# Patient Record
Sex: Male | Born: 1935 | Race: White | Hispanic: No | Marital: Married | Smoking: Former smoker
Health system: Southern US, Community
[De-identification: ages and names within clinical notes are randomized; demographics above are authoritative.]

## PROBLEM LIST (undated history)

## (undated) DIAGNOSIS — K4091 Unilateral inguinal hernia, without obstruction or gangrene, recurrent: Secondary | ICD-10-CM

## (undated) DIAGNOSIS — I10 Essential (primary) hypertension: Secondary | ICD-10-CM

## (undated) DIAGNOSIS — K4090 Unilateral inguinal hernia, without obstruction or gangrene, not specified as recurrent: Secondary | ICD-10-CM

## (undated) DIAGNOSIS — I499 Cardiac arrhythmia, unspecified: Secondary | ICD-10-CM

## (undated) DIAGNOSIS — M1990 Unspecified osteoarthritis, unspecified site: Secondary | ICD-10-CM

## (undated) DIAGNOSIS — E119 Type 2 diabetes mellitus without complications: Secondary | ICD-10-CM

## (undated) DIAGNOSIS — R972 Elevated prostate specific antigen [PSA]: Secondary | ICD-10-CM

## (undated) DIAGNOSIS — I639 Cerebral infarction, unspecified: Secondary | ICD-10-CM

## (undated) DIAGNOSIS — N189 Chronic kidney disease, unspecified: Secondary | ICD-10-CM

## (undated) DIAGNOSIS — Z87442 Personal history of urinary calculi: Secondary | ICD-10-CM

## (undated) HISTORY — DX: Unspecified osteoarthritis, unspecified site: M19.90

## (undated) HISTORY — PX: JOINT REPLACEMENT: SHX530

## (undated) HISTORY — DX: Elevated prostate specific antigen (PSA): R97.20

## (undated) HISTORY — DX: Essential (primary) hypertension: I10

## (undated) HISTORY — PX: TONSILLECTOMY: SUR1361

## (undated) HISTORY — DX: Cerebral infarction, unspecified: I63.9

## (undated) HISTORY — PX: KNEE SURGERY: SHX244

## (undated) HISTORY — PX: EYE SURGERY: SHX253

---

## 1958-10-18 DIAGNOSIS — J189 Pneumonia, unspecified organism: Secondary | ICD-10-CM

## 1958-10-18 HISTORY — DX: Pneumonia, unspecified organism: J18.9

## 2010-08-05 ENCOUNTER — Encounter (INDEPENDENT_AMBULATORY_CARE_PROVIDER_SITE_OTHER): Payer: Self-pay | Admitting: Orthopedic Surgery

## 2010-08-05 ENCOUNTER — Ambulatory Visit: Payer: Self-pay | Admitting: Vascular Surgery

## 2010-08-05 ENCOUNTER — Ambulatory Visit (HOSPITAL_COMMUNITY): Admission: RE | Admit: 2010-08-05 | Discharge: 2010-08-05 | Payer: Self-pay | Admitting: Orthopedic Surgery

## 2010-11-16 LAB — COMPREHENSIVE METABOLIC PANEL
ALT: 32 U/L (ref 0–53)
AST: 26 U/L (ref 0–37)
Albumin: 4 g/dL (ref 3.5–5.2)
Alkaline Phosphatase: 81 U/L (ref 39–117)
Chloride: 102 mEq/L (ref 96–112)
GFR calc Af Amer: >60 mL/min (ref >60)
Potassium: 4.3 mEq/L (ref 3.5–5.1)
Total Bilirubin: 0.3 mg/dL (ref 0.3–1.2)

## 2010-11-16 LAB — CBC
HCT: 44.2 % (ref 39.0–52.0)
MCHC: 33.3 g/dL (ref 30.0–36.0)
MCV: 90 fL (ref 78.0–100.0)
Platelets: 217 10*3/uL (ref 150–400)
RDW: 12.7 % (ref 11.5–15.5)

## 2010-11-16 LAB — ABO/RH: ABO/RH(D): O POS

## 2010-11-16 LAB — DIFFERENTIAL
Basophils Absolute: 0 10*3/uL (ref 0.0–0.1)
Eosinophils Absolute: 0.1 10*3/uL (ref 0.0–0.7)
Eosinophils Relative: 2 % (ref 0–5)
Lymphocytes Relative: 19 % (ref 12–46)
Monocytes Absolute: 0.8 10*3/uL (ref 0.1–1.0)

## 2010-11-16 LAB — PROTIME-INR: INR: 0.9 (ref 0.00–1.49)

## 2010-11-16 LAB — URINALYSIS, ROUTINE W REFLEX MICROSCOPIC
Bilirubin Urine: NEGATIVE
Ketones, ur: NEGATIVE mg/dL
Urine Glucose, Fasting: NEGATIVE mg/dL
pH: 6 (ref 5.0–8.0)

## 2010-11-16 LAB — URINE MICROSCOPIC-ADD ON

## 2010-11-20 ENCOUNTER — Inpatient Hospital Stay (HOSPITAL_COMMUNITY)
Admission: RE | Admit: 2010-11-20 | Discharge: 2010-11-24 | DRG: 470 | Disposition: A | Discharge status: Home Health | Payer: MEDICARE | Attending: Orthopedic Surgery | Admitting: Orthopedic Surgery

## 2010-11-20 ENCOUNTER — Inpatient Hospital Stay (HOSPITAL_COMMUNITY): Payer: MEDICARE

## 2010-11-20 DIAGNOSIS — R062 Wheezing: Secondary | ICD-10-CM | POA: Diagnosis not present

## 2010-11-20 DIAGNOSIS — N400 Enlarged prostate without lower urinary tract symptoms: Secondary | ICD-10-CM | POA: Diagnosis present

## 2010-11-20 DIAGNOSIS — Z96659 Presence of unspecified artificial knee joint: Secondary | ICD-10-CM

## 2010-11-20 DIAGNOSIS — R339 Retention of urine, unspecified: Secondary | ICD-10-CM | POA: Diagnosis not present

## 2010-11-20 DIAGNOSIS — R0902 Hypoxemia: Secondary | ICD-10-CM | POA: Diagnosis not present

## 2010-11-20 DIAGNOSIS — M1710 Unilateral primary osteoarthritis, unspecified knee: Principal | ICD-10-CM | POA: Diagnosis present

## 2010-11-20 DIAGNOSIS — I10 Essential (primary) hypertension: Secondary | ICD-10-CM | POA: Diagnosis present

## 2010-11-20 DIAGNOSIS — D62 Acute posthemorrhagic anemia: Secondary | ICD-10-CM | POA: Diagnosis not present

## 2010-11-20 DIAGNOSIS — E876 Hypokalemia: Secondary | ICD-10-CM | POA: Diagnosis not present

## 2010-11-20 DIAGNOSIS — Z7982 Long term (current) use of aspirin: Secondary | ICD-10-CM

## 2010-11-20 HISTORY — PX: REPLACEMENT TOTAL KNEE: SUR1224

## 2010-11-21 LAB — CBC
HCT: 32.2 % — ABNORMAL LOW (ref 39.0–52.0)
Hemoglobin: 10.6 g/dL — ABNORMAL LOW (ref 13.0–17.0)
RDW: 12.7 % (ref 11.5–15.5)
WBC: 9.8 10*3/uL (ref 4.0–10.5)

## 2010-11-21 LAB — BASIC METABOLIC PANEL
CO2: 30 mEq/L (ref 19–32)
GFR calc non Af Amer: >60 mL/min (ref >60)
Glucose, Bld: 134 mg/dL — ABNORMAL HIGH (ref 70–99)
Potassium: 3.7 mEq/L (ref 3.5–5.1)
Sodium: 138 mEq/L (ref 135–145)

## 2010-11-22 LAB — CBC
HCT: 30.6 % — ABNORMAL LOW (ref 39.0–52.0)
Hemoglobin: 9.9 g/dL — ABNORMAL LOW (ref 13.0–17.0)
MCHC: 32.4 g/dL (ref 30.0–36.0)

## 2010-11-22 LAB — BASIC METABOLIC PANEL
CO2: 27 mEq/L (ref 19–32)
Calcium: 8.5 mg/dL (ref 8.4–10.5)
Chloride: 100 mEq/L (ref 96–112)
Glucose, Bld: 138 mg/dL — ABNORMAL HIGH (ref 70–99)
Potassium: 3.7 mEq/L (ref 3.5–5.1)
Sodium: 139 mEq/L (ref 135–145)

## 2010-11-23 ENCOUNTER — Inpatient Hospital Stay (HOSPITAL_COMMUNITY): Payer: MEDICARE

## 2010-11-23 ENCOUNTER — Encounter (HOSPITAL_COMMUNITY): Payer: Self-pay | Admitting: Orthopedic Surgery

## 2010-11-23 LAB — BASIC METABOLIC PANEL
BUN: 8 mg/dL (ref 6–23)
CO2: 28 mEq/L (ref 19–32)
Calcium: 8.5 mg/dL (ref 8.4–10.5)
Creatinine, Ser: 1 mg/dL (ref 0.4–1.5)
Glucose, Bld: 120 mg/dL — ABNORMAL HIGH (ref 70–99)

## 2010-11-23 LAB — CBC
HCT: 28.8 % — ABNORMAL LOW (ref 39.0–52.0)
Hemoglobin: 9.4 g/dL — ABNORMAL LOW (ref 13.0–17.0)
MCH: 29.6 pg (ref 26.0–34.0)
MCHC: 32.6 g/dL (ref 30.0–36.0)
MCV: 90.6 fL (ref 78.0–100.0)

## 2010-11-23 MED ORDER — IOHEXOL 300 MG/ML  SOLN
100.0000 mL | Freq: Once | INTRAMUSCULAR | Status: AC | PRN
  Administered 2010-11-23: 100 mL via INTRAVENOUS

## 2010-11-24 LAB — BASIC METABOLIC PANEL
CO2: 29 mEq/L (ref 19–32)
Calcium: 8.6 mg/dL (ref 8.4–10.5)
Chloride: 103 mEq/L (ref 96–112)
Creatinine, Ser: 1.03 mg/dL (ref 0.4–1.5)
Glucose, Bld: 115 mg/dL — ABNORMAL HIGH (ref 70–99)

## 2010-11-24 LAB — CBC
HCT: 25.8 % — ABNORMAL LOW (ref 39.0–52.0)
Hemoglobin: 8.6 g/dL — ABNORMAL LOW (ref 13.0–17.0)
MCH: 30.3 pg (ref 26.0–34.0)
MCHC: 33.3 g/dL (ref 30.0–36.0)
RBC: 2.84 MIL/uL — ABNORMAL LOW (ref 4.22–5.81)

## 2010-11-24 NOTE — Consult Note (Signed)
NAME:  Travis [name], Travis [name] NO.:  [id number]  MEDICAL RECORD NO.:  [id number]           PATIENT TYPE:  I  LOCATION:  5018                         FACILITY:  [facility]  PHYSICIAN:  Vania Rea, M.D. DATE OF BIRTH:  06-28-1930  DATE OF CONSULTATION:  11/23/2010 DATE OF DISCHARGE:                                CONSULTATION   PRIMARY CARE PHYSICIAN:  Windle Guard, M.D.  REQUESTING PHYSICIAN:  Dyke Brackett, M.D., orthopedic surgeon.  REASON FOR CONSULTATION:  Wheezing and hypoxia.  HISTORY OF PRESENT ILLNESS:  This is a 77 year old Caucasian gentleman with a history of hypertension and severe osteoarthritis who was electively admitted on November 20, 2010 for total left knee replacement. The patient had an uneventful left knee replacement and has been recuperating well in the hospital.  His hospital stay so far been complicated by urinary retention.  The patient does have a history of benign prostatic hypertrophy and has been prescribed doxazosin, but says he took it for short time and did not seem to be helping so he discontinued taking it, although it is listed on his medication list. Postoperatively, the patient has had episodes of urinary retention and has had a Foley catheter placed.  Additionally, the patient has had episodes of sundowning at night and last night pulled out his Foley catheter and had to have it replaced.  Additionally, the patient's antihypertensive medications were held postoperatively and his blood pressure did rise into the hypertensive range and his medications were restarted this morning.  This morning, the patient was noted to be wheezing and seem to be somewhat short of breath.  His oxygenation was in the low 90s.  His orthopedic surgeons were concerned.  They have ordered chest x-ray, CT angiogram of the chest, and consulted the hospitalist service for assistance with management.  The patient reports no history of heart failure,  but reports that he does have a history of episodic wheezing, but he never takes anything for it.  He reports that he discontinued smoking 50 years ago in his 80s.  He has no family history of lung disease nor heart.  He does have a history of heart disease in second-degree relatives, but he has never been told to have heart disease.  He has noted no change in his urination while in hospital.  He usually has to wake up frequently at night to pass urine.  Review of e-chart records show that he has received normal saline at 75 mL an hour continuously since the postoperative period.  Review of hospital records show his hemoglobin to be 14.7 on November 16, 2010 prior to surgery and 10.6 on February 4 his first postoperative day.  Currently, the patient denies any chest pain, shortness of breath.  He denies any fever or cough.  He says he feels completely normal.  His wife who is at his bedside confirms that he is currently feeling quite okay.  She does not think there was ever anything seriously wrong with him.  She does say that the nurses report that they heard some wheezing when they examined him this morning.  PAST MEDICAL  HISTORY: 1. Hypertension. 2. Benign prostatic hypertrophy.  PREADMISSION MEDICATIONS: 1. Aspirin 325 mg daily. 2. Oxycodone/acetaminophen 5/325 1 tablet every 4 hours as needed. 3. Ibuprofen 400 mg every 8 hours as needed. 4. Doxazosin 4 mg daily.  The patient reports that he has not been     taking this. 5. Hydrochlorothiazide 25 mg daily. 6. Atenolol 50 mg daily.  CURRENT MEDICATIONS: 1. Atenolol 50 mg daily, restarted today. 2. Cardura 4 mg daily, restarted today. 3. Hydrochlorothiazide 25 mg daily, restarted today. 4. Lovenox 30 mg every 12 hours for DVT prophylaxis. 5. Tylenol p.r.n. 6. Benadryl 25 mg at bedtime p.r.n. 7. Robaxin 500 mg p.o. or IV every 6 hours p.r.n. 8. Percocet 1-2 tablets every 4 hours p.r.n. for pain.  ALLERGIES:  MOBIC which  causes dizziness.  SOCIAL HISTORY:  Denies tobacco, alcohol, or illicit drug use, as noted above.  He is retired Hotel manager and attends the [place] once per year to get a checkup.  He is a retired Freight forwarder.  FAMILY HISTORY:  Significant for a stroke in a sister, heart failure in aunts and uncles, otherwise unremarkable.  REVIEW OF SYSTEMS:  Other than noted above, unremarkable.  PHYSICAL EXAMINATION:  GENERAL:  Pleasant elderly Caucasian gentleman reclining in bed, not in any acute distress.  His left leg is in CPM machine. VITAL SIGNS:  His temperature is 96.9, pulse 83, respiration 16, blood pressure 165/81.  He is saturating at 93% on room air. HEENT:  His pupils are round and equal.  Mucous membranes are pink, anicteric.  He is not dehydrated.  No cervical lymphadenopathy.  No thyromegaly.  No carotid bruit. CHEST:  Clear to auscultation bilaterally. CARDIOVASCULAR:  Regular rhythm.  No murmur. ABDOMEN:  Obese, soft, nontender.  No masses. EXTREMITIES:  He has extensive bandage over the left knee, has a trace edema on the left foot.  No edema on the right foot.  Dorsalis pedis pulse 2+ bilaterally. CENTRAL NERVOUS SYSTEM:  Cranial nerves II through XII are grossly intact.  He has no focal lateralizing signs.  LABORATORY DATA:  His white count is 13.6, hemoglobin 9.4, platelets 161, MCV is 90.6.  His sodium is 140, potassium 3.4, chloride 104, CO2 of 28, glucose 120, BUN 8, creatinine 1.0, calcium 8.5.  Portable chest x-ray done this afternoon shows bibasilar atelectatic changes.  CT angiogram of the chest done this afternoon shows no pulmonary emboli, mild bibasilar atelectasis.  No other acute or significant finding.  ASSESSMENT: 1. Transient wheezing, now resolved? possible CHF, transient fluid overload. 2. No evidence of hypoxia. 3. Hypertension, uncontrolled. 4. Osteoarthritis status post recent left knee replacement. 5. No evidence of pulmonary embolism. 6.  Benign prostatic hypertrophy 7. postoperative urinary retention due to BPH. 8. Hypokalemia 9. Post-op anemia.  PLAN: 1. It is likely that both this gentleman's wheezing was caused by     uncontrolled hypertension and fluid overload giving rise to some     degree of heart strain on fluid overload and this may be a contributing     mechanism for his urinary retention if he has developed some     periurethral edema. 2. We would recommend discontinuing IV fluids at this time.  We will B-     type natriuretic peptide, agree with restarting his beta-blockers     and his hydrochlorothiazide.  We will replete his potassium.     Recommend monitoring potassium.  Consideration could be giving for     getting a 2-D echo to  check for diastolic/systolic dysfunction particularly     if his BNP is found to be elevated 3. This gentleman does not need oxygen supplementation. 4. Iron and potassium supplemntation 5. Other plans as per orders.     Vania Rea, M.D.     LC/MEDQ  D:  11/23/2010  T:  11/23/2010  Job:  [id number]  cc:   Windle Guard, M.D. Dyke Brackett, M.D.  Electronically Signed by Vania Rea M.D. on 11/24/2010 03:20:44 AM

## 2010-12-01 NOTE — Op Note (Signed)
NAME:  Travis [name], Travis [name] NO.:  [id number]  MEDICAL RECORD NO.:  [id number]           PATIENT TYPE:  I  LOCATION:  5018                         FACILITY:  [facility]  PHYSICIAN:  Dyke Brackett, M.D.    DATE OF BIRTH:  [birth year]  DATE OF PROCEDURE: DATE OF DISCHARGE:                              OPERATIVE REPORT   INDICATIONS:  This is a 77 year old with intractable knee pain and endstage arthritis particularly of the medial compartment of the left knee, thought to be amenable to hospitalization for total knee replacement.  PREOPERATIVE DIAGNOSIS:  Osteoarthritis, left knee.  POSTOPERATIVE DIAGNOSIS:  Osteoarthritis, left knee.  OPERATION:  Left total knee replacement (Sigma cemented size 4 femur, tibia, #41 patella with 10-mm bearing).  SURGEON:  Dyke Brackett, MD.  ASSISTANTSu Hilt, PA.  TOURNIQUET TIME:  82 minutes.  PROCEDURE:  A straight skin incision and a medial parapatellar approach to the knee make.  Significant contracture was encountered medially as well as to medial compartment arthrosis with beginnings of some severe lateral compartment arthrosis.  We cut the tibia in valgus inclination 3 and 4 mm below the most __________ medical compartment.  We then cut the distal femur with a 4-degree valgus inclination.  Significant amount of tightness was noted in the medial compartment.  We had to do an extensive medial release with stripping of the medial collaterals superficially and deep and even did a release of a portion of the semimembranosus attachment on the medial posterior aspect of the knee. Removed small osteophytes of the tibia as well.  We then __________ 10- mm bearing without excessive tightness and relatively symmetric to the lateral side.  We sized the femur to be a 4, followed by anterior-posterior cut with Chamfer cut as well.  We did some extra releasing off the posterior aspect of the posterior medial femoral condyle with full  release of anterior and posterior cruciate ligaments.  After cutting, attention was then directed to the tibia.  Sized the tibia to be a size 4 TruCut. Placed the trial tibia base plate and then cut the box for the Sigma knee and then placed the trials.  Prosthesis initially seemed to sit in slight flexion.  We corrected that and we had full extension, no significant instability to varus and valgus stress and maybe a slight amount of asymmetry with full extension with a little more opening on the lateral side than medially but this was not felt to be significant, laterally it seemed to be acceptable, and the __________ was 1-2+ with no tendency for bearing spin out with the knee flexed.  We cut  leaving 15 mm of native patella, __________ the patella to 41 size, placed the knee through trials, range of motion with all sizing and cuts to be acceptable.  We removed the trial components.  Irrigated the bony surfaces, prepared the cement on the back table, dried the prosthesis, coated the prosthesis. Placed cement down the key hole as well.  __________ into the bony surfaces.  We then put in the final prosthesis, the tibia, followed by femur and patella.  We  did use a trial bearing while the cement hardened.  Once the cement was hardened, excess cement was removed.  Once the trial bearing was removed, we then released the tourniquet without any __________ no excessive bleeding.  Noticed small bleeders were coagulated.  The Hemovac drain was placed exiting superolaterally and closure was affected with #1 Ethibond, 3-0 Vicryl, and skin clips.  He was taken to the recovery room in stable condition.     Dyke Brackett, M.D.     WDC/MEDQ  D:  11/20/2010  T:  11/21/2010  Job:  [id number]  Electronically Signed by W. Geoffry Bannister M.D. on 12/01/2010 09:36:48 AM

## 2010-12-21 NOTE — Discharge Summary (Signed)
NAME:  Travis [name], RODGER NO.:  [id number]  MEDICAL RECORD NO.:  [id number]           PATIENT TYPE:  I  LOCATION:  5018                         FACILITY:  [facility]  PHYSICIAN:  Dyke Brackett, M.D.    DATE OF BIRTH:  05-22-1930  DATE OF ADMISSION:  11/20/2010 DATE OF DISCHARGE:  11/24/2010                              DISCHARGE SUMMARY   DIAGNOSIS FOR THIS ADMISSION:  Left knee osteoarthritis.  PROCEDURE WHILE IN THE HOSPITAL:  Left total knee arthroplasty.  SECONDARY DIAGNOSES: 1. Hypertension. 2. History of migraine headache and elevated glucose levels.  DISCHARGE SUMMARY:  The patient is 77 year old male with many-month history of increasing left knee pain.  He underwent a left knee scope in 2011 with positive grade 3-4, medial grade 2-3 chondral changes of the patellofemoral compartment and positive partial meniscectomy.  The patient has continued with moderate and worsening postoperative pain despite pain medications, nonsteroidal anti-inflammatories, cane use in physical therapy.  Now wishes to receive a left total knee arthroplasty after discussing risks versus benefits and questions answered. Preoperative evaluation includes recognition of the patient's allergies to meloxicam, which causes nausea.  The patient's medical history is significant for history of headaches, hypertension, osteoarthritis, and prediabetes/elevated glucose level.  MEDICATIONS AT TIME OF ADMISSION: 1. Aspirin 325 mg one half tablet by mouth daily. 2. Percocet one by mouth every 4 hours as needed. 3. Ibuprofen 200 mg two tablets by mouth every 8 hours as needed. 4. Doxazosin 8 mg one half tablet by mouth daily. 5. Hydrochlorothiazide 50 mg one half tablet by mouth daily. 6. Atenolol 100 mg one half tablet by mouth daily.  PHYSICAL EXAMINATION:  VITAL SIGNS:  Shows him to be a 5 feet and 9 inches, 204 pounds male with temperature of 97.8, pulse of 59, respirations 16, blood  pressure 156/82. HEENT:  Head is acephalic, nontraumatic.  Eyes; pupils are equal, round, and accommodating to light.  Ears; TMs are clear.  Nose patent.  Throat benign. NECK:  Full range of motion. CHEST:  Clear to auscultation. HEART:  Regular rate and rhythm. ABDOMEN:  Soft, nontender, a small umbilical hernia, not incarcerated or tender. NEUROLOGIC:  No neurologic deficits noted. SKIN:  Within normal limits with exception of an area of prepatellar both knees which could possibly be psoriasis. MUSCULOSKELETAL:  Shows minimal peripheral edema bilaterally, range of motion in the left knee is decreasing compared to the acetabulum, positive crepitus, stable ligamentously.  X-rays and plain radiograph preoperatively show end-stage arthritis of the left knee.  Preoperative labs including CBC, CMET, chest x-ray, EKG, PT and PTT are all within acceptable limits and on the day of admission, the patient was taken to the operating room at [facility] where he underwent a left total knee replacement using a DePuy Sigma cemented, size 4 femur and tibia, 41-mm patella with 10-mm bearing.  The patient was placed on perioperative antibiotics and placed on postoperative Lovenox prophylaxis.  He was placed on mechanical prophylaxis as well. He was given Percocet for pain as well as injectable pain reliever as well.  Physical therapy was begun on the first  postop day with both CPM and Physical Therapy beginning to work on the process mobilizing the knee and helping the patient how to ambulate safely.  On the first postop day, the patient's hemoglobin of 7.6, WBC of 9.8, otherwise tolerating the CPM well, no other complaints or signs or symptoms of the acute blood loss anemia present on laboratory studies.  He continue on Lovenox and mechanical DVT prophylaxis and physical therapy was continued as well.  On postop day #2, the patient was attempting to get up without assistance and fell landing  directly on his buttocks with no apparent injury.  He unfortunately did self-extricate his Foley at that time during the fall with positive bleeding from around his penis. Because of this episode, Urology was consulted to give further input regarding treatment of the patient's traumatic injury.  The patient remained orthopedically stable and making slow, but steady progress with PT.  On postop day #3, the patient was remained stable.  Hemoglobin 9.4, temperature 98.9, WBC of 13.6.  Wound remained clean and dry.  Incision remained intact.  Good range of motion about the knee.  He was having difficulty with urinary retention until the Foley was replaced into the bladder and at that point, he was having difficulty with this problem. He was developing some wheezes and difficulty breathing, so a chest x- ray was ordered and because of continued complaint of wheezing and difficulty maintaining normal breathing, Dr. Madelon Lips asked the doctors on-call fourth of Respiratory to see the patient.  They saw the patient was negative on the chest x-ray as well as negative for scan for any sort of PE and because of that, treated him appropriately for generalized difficulty breathing including albuterol inhalers.  On the postoperative day #4, the patient continued to make slow, but steady progress in physical therapy.  His wound remained clean and dry.  No sign of infection.  Good range of motion about the knee.  Hemoglobin 8.6, WBC of 7.0.  At the time of the patient's discharge, he was tolerating CPM 0 to 90 without difficulty and had no further bleeding or difficulty around the Foley, which remained in place for total of 10 days postoperatively per Dr. Sherron Monday of Urology.  At that time, the patient's discharge medications were as follows: 1. Tylenol 325 mg 1-2 every 4 hours as needed for temperature     elevation. 2. Cepacol throat lozenges needed for throat irritation. 3. Colace 100 mg by mouth  twice daily. 4. Lovenox 30 mg subcutaneously twice daily for a total of 14 days     postoperatively. 5. Iron 325 mg by mouth daily. 6. Mechanical DVT prophylaxis, continue with thigh TED hose. 7. Robaxin 500 mg p.o. every 6 hours as needed for spasm. 8. Atenolol 100 mg one half tablet by mouth daily. 9. Doxazosin 8 mg one half tablet by mouth daily. 10.Hydrochlorothiazide 50 mg one half tablet by mouth daily. 11.Percocet one tablet every 4 hours as needed for pain.  ACTIVITIES:  Weightbearing as tolerated using a walker and total knee precautions.  Dressing changes daily or as needed to keep the wound clean and dry.  The patient should return to see Dr. Madelon Lips in 1 week's time sooner should have any difficulty with increased swelling, pain or drainage from the wound.     Laural Benes. Jannet Mantis   ______________________________ Dyke Brackett, M.D.    JBR/MEDQ  D:  12/14/2010  T:  12/15/2010  Job:  [id number]  Electronically Signed by Erskine Squibb  ROBERTS P.A. on 12/17/2010 09:59:16 PM Electronically Signed by Lacretia Nicks. Chriselda Leppert M.D. on 12/21/2010 01:47:38 PM

## 2011-08-30 ENCOUNTER — Encounter (INDEPENDENT_AMBULATORY_CARE_PROVIDER_SITE_OTHER): Payer: Self-pay | Admitting: Surgery

## 2011-09-13 ENCOUNTER — Ambulatory Visit (INDEPENDENT_AMBULATORY_CARE_PROVIDER_SITE_OTHER): Payer: Medicare Other | Admitting: Surgery

## 2011-09-13 ENCOUNTER — Encounter (INDEPENDENT_AMBULATORY_CARE_PROVIDER_SITE_OTHER): Payer: Self-pay | Admitting: Surgery

## 2011-09-13 VITALS — BP 112/84 | HR 60 | Temp 97.1°F | Resp 16 | Ht 69.0 in | Wt 212.2 lb

## 2011-09-13 DIAGNOSIS — K4090 Unilateral inguinal hernia, without obstruction or gangrene, not specified as recurrent: Secondary | ICD-10-CM

## 2011-09-13 NOTE — Progress Notes (Signed)
Patient ID: Travis Earl., male   DOB: [birth year], 77 y.o.   MRN: [id number]  Chief Complaint  Patient presents with  . Other    new pt- eval [facility]    HPI Travis [name]. is a 77 y.o. male.   HPIHe is referred by Dr. Jeannetta Nap for evaluation of a symptomatic left inguinal hernia. He has had the hernia for many years. It is now causing increasing discomfort. This is described as an ache. He has had no tract symptoms. He denies nausea and vomiting. He is otherwise without complaints.  Past Medical History  Diagnosis Date  . Hypertension   . PSA elevation   . Arthritis     Past Surgical History  Procedure Date  . Knee surgery   . 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 Heart disease Father     Social History History  Substance Use Topics  . Smoking status: Former Games developer  . Smokeless tobacco: Not on file  . Alcohol Use: No    No Known Allergies  Current Outpatient Prescriptions  Medication Sig Dispense Refill  . atenolol (TENORMIN) 25 MG tablet Take 25 mg by mouth daily.        . hydrochlorothiazide (HYDRODIURIL) 25 MG tablet Take 25 mg by mouth daily.          Review of Systems Review of Systems  Constitutional: Negative for fever, chills and unexpected weight change.  HENT: Negative for hearing loss, congestion, sore throat, trouble swallowing and voice change.   Eyes: Negative for visual disturbance.  Respiratory: Negative for cough and wheezing.   Cardiovascular: Negative for chest pain, palpitations and leg swelling.  Gastrointestinal: Negative for nausea, vomiting, abdominal pain, diarrhea, constipation, blood in stool, abdominal distention, anal bleeding and rectal pain.  Genitourinary: Negative for hematuria and difficulty urinating.  Musculoskeletal: Negative for arthralgias.  Skin: Negative for rash and wound.  Neurological: Negative for seizures, syncope, weakness and headaches.  Hematological:  Negative for adenopathy. Does not bruise/bleed easily.  Psychiatric/Behavioral: Negative for confusion.    Blood pressure 112/84, pulse 60, temperature 97.1 F (36.2 C), temperature source Temporal, resp. rate 16, height 5\' 9"  (1.753 m), weight 212 lb 3.2 oz (96.253 kg).  Physical Exam Physical Exam  Constitutional: He is oriented to person, place, and time. He appears well-developed and well-nourished. No distress.  HENT:  Head: Normocephalic and atraumatic.  Right Ear: External ear normal.  Left Ear: External ear normal.  Nose: Nose normal.  Mouth/Throat: Oropharynx is clear and moist. No oropharyngeal exudate.  Eyes: Conjunctivae are normal. Pupils are equal, round, and reactive to light. No scleral icterus.  Neck: Normal range of motion. Neck supple. No tracheal deviation present. No thyromegaly present.  Cardiovascular: Normal rate, normal heart sounds and intact distal pulses.   No murmur heard. Pulmonary/Chest: Effort normal and breath sounds normal. No respiratory distress. He has no wheezes.  Abdominal: Soft. Bowel sounds are normal. He exhibits no distension. There is no tenderness. There is no rebound. A hernia is present. Hernia confirmed positive in the left inguinal area.  Musculoskeletal: Normal range of motion. He exhibits no edema and no tenderness.  Lymphadenopathy:    He has no cervical adenopathy.  Neurological: He is alert and oriented to person, place, and time.  Skin: Skin is warm and dry. No rash noted. No erythema.  Psychiatric: His behavior is normal. Judgment normal.    Data  Reviewed   Assessment    Left inguinal hernia    Plan    Patient with small, reducible, left inguinal hernia. He also has an asymptomatic tiny umbilical hernia. As the inguinal hernia is symptomatic, he is considering repair. I discussed the laparoscopic and open techniques with him as well as the use of mesh. I discussed the risks of surgery which includes but is not limited to  bleeding, infection, recurrence, nerve entrapment, chronic pain, et Karie Soda. I am recommending open repair so we can avoid a Foley catheter as he had problems with the Foley during his knee replacement. Also, I could limit the anesthesia with open surgery. He is going to think about it and call me back.       Travis [name] A 09/13/2011, 11:35 AM

## 2011-09-29 ENCOUNTER — Encounter (HOSPITAL_COMMUNITY): Payer: Self-pay | Admitting: Pharmacy Technician

## 2011-10-04 ENCOUNTER — Encounter (HOSPITAL_COMMUNITY): Payer: Self-pay

## 2011-10-04 ENCOUNTER — Encounter (HOSPITAL_COMMUNITY)
Admission: RE | Admit: 2011-10-04 | Discharge: 2011-10-04 | Disposition: A | Discharge status: Home / Self Care | Payer: Medicare Other | Source: Ambulatory Visit | Attending: Surgery | Admitting: Surgery

## 2011-10-04 HISTORY — DX: Chronic kidney disease, unspecified: N18.9

## 2011-10-04 LAB — CBC
HCT: 44.4 % (ref 39.0–52.0)
Hemoglobin: 14.9 g/dL (ref 13.0–17.0)
MCV: 89.7 fL (ref 78.0–100.0)
RBC: 4.95 MIL/uL (ref 4.22–5.81)
RDW: 12.5 % (ref 11.5–15.5)
WBC: 6.5 10*3/uL (ref 4.0–10.5)

## 2011-10-04 LAB — BASIC METABOLIC PANEL
BUN: 16 mg/dL (ref 6–23)
CO2: 28 mEq/L (ref 19–32)
Chloride: 103 mEq/L (ref 96–112)
Creatinine, Ser: 0.89 mg/dL (ref 0.50–1.35)
Glucose, Bld: 122 mg/dL — ABNORMAL HIGH (ref 70–99)
Potassium: 4.1 mEq/L (ref 3.5–5.1)

## 2011-10-04 NOTE — Pre-Procedure Instructions (Signed)
2650- no new posting re umbilical hernia as of now

## 2011-10-04 NOTE — Pre-Procedure Instructions (Signed)
Chest x ray and EKG 2/12 on chart/    At PST visit, patient stated he thinks he is having an umbilical hernia repair as well as inguinal.    Called office and gave message to Jena in nursing office. Instructed her patient would only be here few more minutes and gave her pts home number. Patient aware will sign OR consent AM OF OR

## 2011-10-04 NOTE — Patient Instructions (Signed)
20 Travis [name]  10/04/2011   Your procedure is scheduled on:  10/06/11    Wednesday   Surgery 1310-1410  Report to [facility] at 1040 AM.  Call this number if you have problems the morning of surgery: [phone]   Remember:   Do not eat food:After Midnight.  Tuesday NIGHT  May have clear liquids:until Midnight . Tuesday NIGHT  Clear liquids include soda, tea, black coffee, apple or grape juice, broth.  Take these medicines the morning of surgery with A SIP OF WATER:  ATENOLOL WITH SIP WATER  Do not wear jewelry, make-up or nail polish.  Do not wear lotions, powders, or perfumes. You may wear deodorant.  Do not shave 48 hours prior to surgery.  Do not bring valuables to the hospital.  Contacts, dentures or bridgework may not be worn into surgery.  Leave suitcase in the car. After surgery it may be brought to your room.  For patients admitted to the hospital, checkout time is 11:00 AM the day of discharge.   Patients discharged the day of surgery will not be allowed to drive home.  Name and phone number of your driverwife  Special Instructions: CHG Shower Use Special Wash: 1/2 bottle night before surgery and 1/2 bottle morning of surgery. REGULAR SOAP FACE AND PRIVATES   Please read over the following fact sheets that you were given: MRSA Information

## 2011-10-06 ENCOUNTER — Encounter (HOSPITAL_COMMUNITY): Payer: Self-pay | Admitting: Anesthesiology

## 2011-10-06 ENCOUNTER — Encounter (HOSPITAL_COMMUNITY): Payer: Self-pay | Admitting: *Deleted

## 2011-10-06 ENCOUNTER — Ambulatory Visit (HOSPITAL_COMMUNITY)
Admission: RE | Admit: 2011-10-06 | Discharge: 2011-10-06 | Disposition: A | Discharge status: Home / Self Care | Payer: Medicare Other | Source: Ambulatory Visit | Attending: Surgery | Admitting: Surgery

## 2011-10-06 ENCOUNTER — Encounter (HOSPITAL_COMMUNITY)
Admission: RE | Disposition: A | Discharge status: Home / Self Care | Payer: Self-pay | Source: Ambulatory Visit | Attending: Surgery

## 2011-10-06 ENCOUNTER — Ambulatory Visit (HOSPITAL_COMMUNITY): Payer: Medicare Other | Admitting: Anesthesiology

## 2011-10-06 DIAGNOSIS — K429 Umbilical hernia without obstruction or gangrene: Secondary | ICD-10-CM | POA: Insufficient documentation

## 2011-10-06 DIAGNOSIS — K4090 Unilateral inguinal hernia, without obstruction or gangrene, not specified as recurrent: Secondary | ICD-10-CM

## 2011-10-06 DIAGNOSIS — I10 Essential (primary) hypertension: Secondary | ICD-10-CM | POA: Insufficient documentation

## 2011-10-06 DIAGNOSIS — Z01812 Encounter for preprocedural laboratory examination: Secondary | ICD-10-CM | POA: Insufficient documentation

## 2011-10-06 DIAGNOSIS — Z79899 Other long term (current) drug therapy: Secondary | ICD-10-CM | POA: Insufficient documentation

## 2011-10-06 DIAGNOSIS — Z96659 Presence of unspecified artificial knee joint: Secondary | ICD-10-CM | POA: Insufficient documentation

## 2011-10-06 HISTORY — PX: INGUINAL HERNIA REPAIR: SHX194

## 2011-10-06 HISTORY — PX: UMBILICAL HERNIA REPAIR: SHX196

## 2011-10-06 LAB — GLUCOSE, CAPILLARY: Glucose-Capillary: 117 mg/dL — ABNORMAL HIGH (ref 70–99)

## 2011-10-06 SURGERY — REPAIR, HERNIA, INGUINAL, ADULT
Anesthesia: General | Site: Abdomen | Wound class: Clean

## 2011-10-06 MED ORDER — PROMETHAZINE HCL 25 MG/ML IJ SOLN: 12.5000 mg | Freq: Four times a day (QID) | INTRAMUSCULAR | Status: DC | PRN

## 2011-10-06 MED ORDER — LIDOCAINE HCL (CARDIAC) 20 MG/ML IV SOLN
INTRAVENOUS | Status: DC | PRN
  Administered 2011-10-06: 100 mg via INTRAVENOUS

## 2011-10-06 MED ORDER — FENTANYL CITRATE 0.05 MG/ML IJ SOLN: 25.0000 ug | INTRAMUSCULAR | Status: DC | PRN

## 2011-10-06 MED ORDER — ACETAMINOPHEN 325 MG PO TABS: 650.0000 mg | ORAL_TABLET | ORAL | Status: DC | PRN

## 2011-10-06 MED ORDER — ACETAMINOPHEN 650 MG RE SUPP
650.0000 mg | RECTAL | Status: DC | PRN
  Filled 2011-10-06: qty 1

## 2011-10-06 MED ORDER — PROPOFOL 10 MG/ML IV BOLUS
INTRAVENOUS | Status: DC | PRN
  Administered 2011-10-06: 160 mg via INTRAVENOUS

## 2011-10-06 MED ORDER — FENTANYL CITRATE 0.05 MG/ML IJ SOLN
INTRAMUSCULAR | Status: DC | PRN
  Administered 2011-10-06 (×2): 25 ug via INTRAVENOUS
  Administered 2011-10-06: 100 ug via INTRAVENOUS
  Administered 2011-10-06: 50 ug via INTRAVENOUS

## 2011-10-06 MED ORDER — ACETAMINOPHEN 10 MG/ML IV SOLN
INTRAVENOUS | Status: AC
  Filled 2011-10-06: qty 100

## 2011-10-06 MED ORDER — CEFAZOLIN SODIUM-DEXTROSE 2-3 GM-% IV SOLR
INTRAVENOUS | Status: AC
  Filled 2011-10-06: qty 50

## 2011-10-06 MED ORDER — SODIUM CHLORIDE 0.9 % IJ SOLN: 3.0000 mL | Freq: Two times a day (BID) | INTRAMUSCULAR | Status: DC

## 2011-10-06 MED ORDER — LACTATED RINGERS IV SOLN
INTRAVENOUS | Status: DC
  Administered 2011-10-06: 1000 mL via INTRAVENOUS

## 2011-10-06 MED ORDER — ONDANSETRON HCL 4 MG/2ML IJ SOLN
INTRAMUSCULAR | Status: DC | PRN
  Administered 2011-10-06: 4 mg via INTRAVENOUS

## 2011-10-06 MED ORDER — HYDROCODONE-ACETAMINOPHEN 5-325 MG PO TABS: 1.0000 | ORAL_TABLET | Freq: Four times a day (QID) | ORAL | Status: AC | PRN

## 2011-10-06 MED ORDER — EPHEDRINE SULFATE 50 MG/ML IJ SOLN
INTRAMUSCULAR | Status: DC | PRN
  Administered 2011-10-06: 10 mg via INTRAVENOUS

## 2011-10-06 MED ORDER — ACETAMINOPHEN 10 MG/ML IV SOLN
INTRAVENOUS | Status: DC | PRN
  Administered 2011-10-06: 1000 mg via INTRAVENOUS

## 2011-10-06 MED ORDER — MORPHINE SULFATE 2 MG/ML IJ SOLN: 1.0000 mg | INTRAMUSCULAR | Status: DC | PRN

## 2011-10-06 MED ORDER — MIDAZOLAM HCL 5 MG/5ML IJ SOLN
INTRAMUSCULAR | Status: DC | PRN
  Administered 2011-10-06 (×2): 1 mg via INTRAVENOUS

## 2011-10-06 MED ORDER — PROMETHAZINE HCL 25 MG/ML IJ SOLN: 6.2500 mg | INTRAMUSCULAR | Status: DC | PRN

## 2011-10-06 MED ORDER — ONDANSETRON HCL 4 MG/2ML IJ SOLN: 4.0000 mg | Freq: Four times a day (QID) | INTRAMUSCULAR | Status: DC | PRN

## 2011-10-06 MED ORDER — BUPIVACAINE HCL (PF) 0.5 % IJ SOLN
INTRAMUSCULAR | Status: AC
  Filled 2011-10-06: qty 30

## 2011-10-06 MED ORDER — SODIUM CHLORIDE 0.9 % IJ SOLN: 3.0000 mL | INTRAMUSCULAR | Status: DC | PRN

## 2011-10-06 MED ORDER — CEFAZOLIN SODIUM-DEXTROSE 2-3 GM-% IV SOLR
2.0000 g | INTRAVENOUS | Status: AC
  Administered 2011-10-06: 2 g via INTRAVENOUS

## 2011-10-06 MED ORDER — BUPIVACAINE HCL (PF) 0.5 % IJ SOLN
INTRAMUSCULAR | Status: DC | PRN
  Administered 2011-10-06: 17 mL

## 2011-10-06 MED ORDER — SODIUM CHLORIDE 0.9 % IV SOLN: 250.0000 mL | INTRAVENOUS | Status: DC | PRN

## 2011-10-06 MED ORDER — OXYCODONE HCL 5 MG PO TABS
ORAL_TABLET | ORAL | Status: AC
  Filled 2011-10-06: qty 1

## 2011-10-06 MED ORDER — OXYCODONE HCL 5 MG PO TABS
5.0000 mg | ORAL_TABLET | ORAL | Status: DC | PRN
  Administered 2011-10-06: 5 mg via ORAL

## 2011-10-06 MED ORDER — 0.9 % SODIUM CHLORIDE (POUR BTL) OPTIME
TOPICAL | Status: DC | PRN
  Administered 2011-10-06: 1000 mL

## 2011-10-06 SURGICAL SUPPLY — 46 items
ADH SKN CLS APL DERMABOND .7 (GAUZE/BANDAGES/DRESSINGS)
APL SKNCLS STERI-STRIP NONHPOA (GAUZE/BANDAGES/DRESSINGS) ×2
BENZOIN TINCTURE PRP APPL 2/3 (GAUZE/BANDAGES/DRESSINGS) ×1 IMPLANT
BLADE HEX COATED 2.75 (ELECTRODE) ×3 IMPLANT
BLADE SURG 15 STRL LF DISP TIS (BLADE) ×2 IMPLANT
BLADE SURG 15 STRL SS (BLADE) ×3
BLADE SURG SZ10 CARB STEEL (BLADE) ×3 IMPLANT
CANISTER SUCTION 2500CC (MISCELLANEOUS) ×3 IMPLANT
CHLORAPREP W/TINT 26ML (MISCELLANEOUS) ×3 IMPLANT
CLOSURE STERI STRIP 1/2 X4 (GAUZE/BANDAGES/DRESSINGS) ×1 IMPLANT
CLOTH BEACON ORANGE TIMEOUT ST (SAFETY) ×3 IMPLANT
DECANTER SPIKE VIAL GLASS SM (MISCELLANEOUS) IMPLANT
DERMABOND ADVANCED (GAUZE/BANDAGES/DRESSINGS)
DERMABOND ADVANCED .7 DNX12 (GAUZE/BANDAGES/DRESSINGS) IMPLANT
DRAIN PENROSE 18X1/2 LTX STRL (DRAIN) ×3 IMPLANT
DRAPE LAPAROTOMY TRNSV 102X78 (DRAPE) ×3 IMPLANT
ELECT REM PT RETURN 9FT ADLT (ELECTROSURGICAL) ×3
ELECTRODE REM PT RTRN 9FT ADLT (ELECTROSURGICAL) ×2 IMPLANT
GAUZE SPONGE 4X4 16PLY XRAY LF (GAUZE/BANDAGES/DRESSINGS) IMPLANT
GLOVE SURG SIGNA 7.5 PF LTX (GLOVE) ×3 IMPLANT
GOWN STRL NON-REIN LRG LVL3 (GOWN DISPOSABLE) ×3 IMPLANT
GOWN STRL REIN XL XLG (GOWN DISPOSABLE) ×6 IMPLANT
KIT BASIN OR (CUSTOM PROCEDURE TRAY) ×3 IMPLANT
MESH PARIETEX PROGRIP LEFT (Mesh General) ×1 IMPLANT
NDL HYPO 25X1 1.5 SAFETY (NEEDLE) ×2 IMPLANT
NEEDLE HYPO 22GX1.5 SAFETY (NEEDLE) IMPLANT
NEEDLE HYPO 25X1 1.5 SAFETY (NEEDLE) ×3 IMPLANT
NS IRRIG 1000ML POUR BTL (IV SOLUTION) ×3 IMPLANT
PACK BASIC VI WITH GOWN DISP (CUSTOM PROCEDURE TRAY) ×3 IMPLANT
PENCIL BUTTON HOLSTER BLD 10FT (ELECTRODE) ×3 IMPLANT
SPONGE GAUZE 4X4 12PLY (GAUZE/BANDAGES/DRESSINGS) ×1 IMPLANT
SPONGE LAP 4X18 X RAY DECT (DISPOSABLE) ×3 IMPLANT
STRIP CLOSURE SKIN 1/2X4 (GAUZE/BANDAGES/DRESSINGS) IMPLANT
SUT MNCRL AB 4-0 PS2 18 (SUTURE) ×3 IMPLANT
SUT SILK 2 0 SH (SUTURE) ×3 IMPLANT
SUT VIC AB 2-0 CT1 27 (SUTURE) ×3
SUT VIC AB 2-0 CT1 TAPERPNT 27 (SUTURE) IMPLANT
SUT VIC AB 3-0 54XBRD REEL (SUTURE) IMPLANT
SUT VIC AB 3-0 BRD 54 (SUTURE)
SUT VIC AB 3-0 SH 27 (SUTURE) ×3
SUT VIC AB 3-0 SH 27XBRD (SUTURE) IMPLANT
SYR BULB IRRIGATION 50ML (SYRINGE) IMPLANT
SYR CONTROL 10ML LL (SYRINGE) ×3 IMPLANT
TAPE HYPAFIX 4 X10 (GAUZE/BANDAGES/DRESSINGS) ×1 IMPLANT
TOWEL OR 17X26 10 PK STRL BLUE (TOWEL DISPOSABLE) ×3 IMPLANT
YANKAUER SUCT BULB TIP 10FT TU (MISCELLANEOUS) ×3 IMPLANT

## 2011-10-06 NOTE — Anesthesia Procedure Notes (Signed)
Procedures

## 2011-10-06 NOTE — Interval H&P Note (Signed)
History and Physical Interval Note:  No change in history or exam  10/06/2011 6:30 AM  Travis [name]  has presented today for surgery, with the diagnosis of left inguinal hernia  The various methods of treatment have been discussed with the patient and family. After consideration of risks, benefits and other options for treatment, the patient has consented to  Procedure(s): HERNIA REPAIR INGUINAL ADULT as a surgical intervention .  The patients' history has been reviewed, patient examined, no change in status, stable for surgery.  I have reviewed the patients' chart and labs.  Questions were answered to the patient's satisfaction.     [name]

## 2011-10-06 NOTE — Op Note (Signed)
NAME:  Travis [name], Travis [name] NO.:  [id number]  MEDICAL RECORD NO.:  [id number]  LOCATION:  [facility]                         FACILITY:  [facility]  PHYSICIAN:  Abigail Miyamoto, M.D. DATE OF BIRTH:  Mar 01, 1930  DATE OF PROCEDURE:  10/06/2011 DATE OF DISCHARGE:                              OPERATIVE REPORT   PREOPERATIVE DIAGNOSES: 1. Left inguinal hernia. 2. Umbilical hernia.  POSTOPERATIVE DIAGNOSES: 1. Left inguinal hernia. 2. Umbilical hernia.  PROCEDURE: 1. Left inguinal hernia repair with mesh. 2. Umbilical hernia repair.  SURGEONS:  Abigail Miyamoto, M.D.  ANESTHESIA:  General and 0.5% Marcaine.  ESTIMATED BLOOD LOSS:  Minimal.  PROCEDURE IN DETAIL:  The patient was brought to the operating room, identified as [employer].  He was placed supine on the operating table and general anesthesia was induced.  His abdomen and groin were then prepped and draped in usual sterile fashion.  Upon the left ilioinguinal nerve block with Marcaine, and anesthetized the skin as well with Marcaine, I then made a longitudinal incision in the patient's left groin.  I took this down through the Scarpa fascia with electrocautery. The external oblique fascia was then identified and opened.  The patient had a large indirect as well as direct inguinal hernia.  I was able to reduce both hernia sacs.  I reduced all the contents back to the abdominal cavity.  I then tied off the base of the indirect sac with a silk suture and then excised the redundant sac.  I then __________ the direct hernia defect and closed the overlying tissue with 2-0 Vicryl sutures.  I then brought a piece of ProGrip Prolene mesh onto the field. I placed it around the cord structures and secured it to the inguinal floor.  I then used one 2-0 Vicryl suture to secure the mesh to the pubic tubercle, and one to secure to transversalis fascia.  Good coverage of the inguinal floor appeared to be achieved.  At this  point, as most of the external oblique fascia was blown out, I was only able to close Scarpa fascia over the top.  I did this with interrupted 3-0 Vicryl sutures.  I then closed the skin with running 4-0 Monocryl. Next, I anesthetized the skin with Marcaine.  I made a small transverse incision just below the umbilicus with scalpel.  I then took this down to the umbilical hernia defect.  I excised the sac from the overlying umbilical skin.  I then excised the sac in its entirety with the hernia __________ was found to contain only omentum.  I then reduced the omentum back into the abdominal cavity.  This was a small fascial defect.  I thus elected not to use mesh.  I closed the fascial defect with interrupted #1 Novafil sutures.  Good closure appeared to be achieved.  I then anesthetized the fascia and the surrounding skin further with Marcaine.  I closed subcutaneous tissue with interrupted 3- 0 Vicryl sutures, closed the skin with running 4-0 Monocryl.  Steri-Strips, gauze, and tape were then applied.  The patient tolerated the procedure well.  All counts were correct at the end of the procedure.  The patient was then  extubated in the operating room and taken in a stable condition to the recovery room.     Abigail Miyamoto, M.D.     DB/MEDQ  D:  10/06/2011  T:  10/06/2011  Job:  [id number]

## 2011-10-06 NOTE — Anesthesia Postprocedure Evaluation (Signed)
  Anesthesia Post-op Note  Patient: Travis [name]  Procedure(s) Performed:  HERNIA REPAIR INGUINAL ADULT - Open Left Inguinal Hernia Repair with Mesh; HERNIA REPAIR UMBILICAL ADULT  Patient Location: PACU  Anesthesia Type: General  Level of Consciousness: awake and alert   Airway and Oxygen Therapy: Patient Spontanous Breathing  Post-op Pain: mild  Post-op Assessment: Post-op Vital signs reviewed, Patient's Cardiovascular Status Stable, Respiratory Function Stable, Patent Airway and No signs of Nausea or vomiting  Post-op Vital Signs: stable  Complications: No apparent anesthesia complications

## 2011-10-06 NOTE — H&P (View-Only) (Signed)
Patient ID: Travis H Fewell Jr., male   DOB: 12/27/1929, 77 y.o.   MRN: [id number]  Chief Complaint  Patient presents with  . Other    new pt- eval [facility]    HPI Travis H Travis Jr. is a 77 y.o. male.   HPIHe is referred by Dr. Elkins for evaluation of a symptomatic left inguinal hernia. He has had the hernia for many years. It is now causing increasing discomfort. This is described as an ache. He has had no tract symptoms. He denies nausea and vomiting. He is otherwise without complaints.  Past Medical History  Diagnosis Date  . Hypertension   . PSA elevation   . Arthritis     Past Surgical History  Procedure Date  . Knee surgery   . 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 Heart disease Father     Social History History  Substance Use Topics  . Smoking status: Former Smoker  . Smokeless tobacco: Not on file  . Alcohol Use: No    No Known Allergies  Current Outpatient Prescriptions  Medication Sig Dispense Refill  . atenolol (TENORMIN) 25 MG tablet Take 25 mg by mouth daily.        . hydrochlorothiazide (HYDRODIURIL) 25 MG tablet Take 25 mg by mouth daily.          Review of Systems Review of Systems  Constitutional: Negative for fever, chills and unexpected weight change.  HENT: Negative for hearing loss, congestion, sore throat, trouble swallowing and voice change.   Eyes: Negative for visual disturbance.  Respiratory: Negative for cough and wheezing.   Cardiovascular: Negative for chest pain, palpitations and leg swelling.  Gastrointestinal: Negative for nausea, vomiting, abdominal pain, diarrhea, constipation, blood in stool, abdominal distention, anal bleeding and rectal pain.  Genitourinary: Negative for hematuria and difficulty urinating.  Musculoskeletal: Negative for arthralgias.  Skin: Negative for rash and wound.  Neurological: Negative for seizures, syncope, weakness and headaches.  Hematological:  Negative for adenopathy. Does not bruise/bleed easily.  Psychiatric/Behavioral: Negative for confusion.    Blood pressure 112/84, pulse 60, temperature 97.1 F (36.2 C), temperature source Temporal, resp. rate 16, height 5' 9" (1.753 m), weight 212 lb 3.2 oz (96.253 kg).  Physical Exam Physical Exam  Constitutional: He is oriented to person, place, and time. He appears well-developed and well-nourished. No distress.  HENT:  Head: Normocephalic and atraumatic.  Right Ear: External ear normal.  Left Ear: External ear normal.  Nose: Nose normal.  Mouth/Throat: Oropharynx is clear and moist. No oropharyngeal exudate.  Eyes: Conjunctivae are normal. Pupils are equal, round, and reactive to light. No scleral icterus.  Neck: Normal range of motion. Neck supple. No tracheal deviation present. No thyromegaly present.  Cardiovascular: Normal rate, normal heart sounds and intact distal pulses.   No murmur heard. Pulmonary/Chest: Effort normal and breath sounds normal. No respiratory distress. He has no wheezes.  Abdominal: Soft. Bowel sounds are normal. He exhibits no distension. There is no tenderness. There is no rebound. A hernia is present. Hernia confirmed positive in the left inguinal area.  Musculoskeletal: Normal range of motion. He exhibits no edema and no tenderness.  Lymphadenopathy:    He has no cervical adenopathy.  Neurological: He is alert and oriented to person, place, and time.  Skin: Skin is warm and dry. No rash noted. No erythema.  Psychiatric: His behavior is normal. Judgment normal.    Data   Reviewed   Assessment    Left inguinal hernia    Plan    Patient with small, reducible, left inguinal hernia. He also has an asymptomatic tiny umbilical hernia. As the inguinal hernia is symptomatic, he is considering repair. I discussed the laparoscopic and open techniques with him as well as the use of mesh. I discussed the risks of surgery which includes but is not limited to  bleeding, infection, recurrence, nerve entrapment, chronic pain, et cetera. I am recommending open repair so we can avoid a Foley catheter as he had problems with the Foley during his knee replacement. Also, I could limit the anesthesia with open surgery. He is going to think about it and call me back.       Malic Rosten A 09/13/2011, 11:35 AM    

## 2011-10-06 NOTE — Transfer of Care (Signed)
Immediate Anesthesia Transfer of Care Note  Patient: Travis [name]  Procedure(s) Performed:  HERNIA REPAIR INGUINAL ADULT - Open Left Inguinal Hernia Repair with Mesh; HERNIA REPAIR UMBILICAL ADULT  Patient Location: PACU  Anesthesia Type: General  Level of Consciousness: awake and alert   Airway & Oxygen Therapy: Patient Spontanous Breathing and Patient connected to face mask oxygen  Post-op Assessment: Report given to PACU RN and Post -op Vital signs reviewed and stable  Post vital signs: Reviewed and stable  Complications: No apparent anesthesia complications

## 2011-10-06 NOTE — Interval H&P Note (Signed)
History and Physical Interval Note: patient has decided now that he would also like his umbilical hernia fixed today as well as the left inguinal hernia.  Both will need mesh  10/06/2011 11:41 AM  Travis [name]  has presented today for surgery, with the diagnosis of left inguinal hernia  The various methods of treatment have been discussed with the patient and family. After consideration of risks, benefits and other options for treatment, the patient has consented to  Procedure(s): HERNIA REPAIR INGUINAL ADULT, UMBILICAL HERNIA REPAIR BOTH WITH MESH as a surgical intervention .  The patients' history has been reviewed, patient examined, no change in status, stable for surgery.  I have reviewed the patients' chart and labs.  Questions were answered to the patient's satisfaction.     Quiera Diffee A

## 2011-10-06 NOTE — Op Note (Addendum)
10/06/2011  12:49 PM  PATIENT:  Travis [name]  77 y.o. male  PRE-OPERATIVE DIAGNOSIS:  left inguinal hernia  POST-OPERATIVE DIAGNOSIS:  * No post-op diagnosis entered *  PROCEDURE:  Procedure(s): HERNIA REPAIR INGUINAL ADULT HERNIA REPAIR UMBILICAL ADULT  SURGEON:  Surgeon(s): Shelly Rubenstein, MD  PHYSICIAN ASSISTANT:   ASSISTANTS: none   ANESTHESIA:   local and general  EBL:     BLOOD ADMINISTERED:none  DRAINS: none   LOCAL MEDICATIONS USED:  MARCAINE 20CC  SPECIMEN:  No Specimen  DISPOSITION OF SPECIMEN:  N/A  COUNTS:  YES  TOURNIQUET:  * No tourniquets in log *  DICTATION: .Other Dictation: Dictation Number 0  [id number]  PLAN OF CARE: Discharge to home after PACU  PATIENT DISPOSITION:  PACU - hemodynamically stable.   Delay start of Pharmacological VTE agent (>24hrs) due to surgical blood loss or risk of bleeding:  {YES/NO/NOT APPLICABLE:20182

## 2011-10-06 NOTE — Anesthesia Preprocedure Evaluation (Signed)
Anesthesia Evaluation    Airway Mallampati: II TM Distance: >3 FB Neck ROM: Full    Dental No notable dental hx.    Pulmonary former smoker clear to auscultation  Pulmonary exam normal       Cardiovascular hypertension, Pt. on medications and Pt. on home beta blockers Regular Normal    Neuro/Psych    GI/Hepatic   Endo/Other    Renal/GU      Musculoskeletal   Abdominal (+) obese,   Peds  Hematology   Anesthesia Other Findings   Reproductive/Obstetrics                           Anesthesia Physical Anesthesia Plan  ASA: II  Anesthesia Plan: General   Post-op Pain Management:    Induction: Intravenous  Airway Management Planned: LMA  Additional Equipment:   Intra-op Plan:   Post-operative Plan: Extubation in OR  Informed Consent: I have reviewed the patients History and Physical, chart, labs and discussed the procedure including the risks, benefits and alternatives for the proposed anesthesia with the patient or authorized representative who has indicated his/her understanding and acceptance.   Dental advisory given  Plan Discussed with: CRNA  Anesthesia Plan Comments:         Anesthesia Quick Evaluation

## 2011-10-15 ENCOUNTER — Encounter (HOSPITAL_COMMUNITY): Payer: Self-pay | Admitting: Surgery

## 2011-10-28 ENCOUNTER — Ambulatory Visit (INDEPENDENT_AMBULATORY_CARE_PROVIDER_SITE_OTHER): Payer: Medicare Other | Admitting: Surgery

## 2011-10-28 ENCOUNTER — Encounter (INDEPENDENT_AMBULATORY_CARE_PROVIDER_SITE_OTHER): Payer: Self-pay | Admitting: Surgery

## 2011-10-28 VITALS — BP 146/82 | HR 68 | Temp 97.9°F | Resp 18 | Ht 69.0 in | Wt 213.0 lb

## 2011-10-28 DIAGNOSIS — Z09 Encounter for follow-up examination after completed treatment for conditions other than malignant neoplasm: Secondary | ICD-10-CM

## 2011-10-28 NOTE — Progress Notes (Signed)
Subjective:     Patient ID: Travis [name], male   DOB: 14-Dec-1929, 78 y.o.   MRN: [id number]  HPI  He is here for his first postoperative visit status post left inguinal hernia repair with mesh and umbilical hernia repair. He is doing moderately well Review of Systems     Objective:   Physical Exam On exam, he still has moderate swelling in the left groin with a small hematoma. There is no evidence for recurrence. His umbilical hernia is healing well    Assessment:     Patient status post umbilical and inguinal hernia repair with mesh    Plan:     iHe will return for maybe lifting for one more week. If the area swelling did not resolve he'll come back and see me

## 2015-06-27 ENCOUNTER — Other Ambulatory Visit: Payer: Self-pay

## 2015-07-05 ENCOUNTER — Emergency Department (HOSPITAL_COMMUNITY): Payer: Medicare Other

## 2015-07-05 ENCOUNTER — Inpatient Hospital Stay (HOSPITAL_COMMUNITY): Payer: Medicare Other

## 2015-07-05 ENCOUNTER — Encounter (HOSPITAL_COMMUNITY): Payer: Self-pay | Admitting: Emergency Medicine

## 2015-07-05 ENCOUNTER — Inpatient Hospital Stay (HOSPITAL_COMMUNITY)
Admission: EM | Admit: 2015-07-05 | Discharge: 2015-07-11 | DRG: 066 | Disposition: A | Discharge status: Skilled Nursing Facility | Payer: Medicare Other | Attending: Internal Medicine | Admitting: Internal Medicine

## 2015-07-05 DIAGNOSIS — E860 Dehydration: Secondary | ICD-10-CM | POA: Diagnosis present

## 2015-07-05 DIAGNOSIS — R42 Dizziness and giddiness: Secondary | ICD-10-CM | POA: Diagnosis not present

## 2015-07-05 DIAGNOSIS — R7303 Prediabetes: Secondary | ICD-10-CM

## 2015-07-05 DIAGNOSIS — R110 Nausea: Secondary | ICD-10-CM

## 2015-07-05 DIAGNOSIS — I639 Cerebral infarction, unspecified: Principal | ICD-10-CM

## 2015-07-05 DIAGNOSIS — R001 Bradycardia, unspecified: Secondary | ICD-10-CM | POA: Diagnosis not present

## 2015-07-05 DIAGNOSIS — M1990 Unspecified osteoarthritis, unspecified site: Secondary | ICD-10-CM

## 2015-07-05 DIAGNOSIS — Z87891 Personal history of nicotine dependence: Secondary | ICD-10-CM

## 2015-07-05 DIAGNOSIS — I6789 Other cerebrovascular disease: Secondary | ICD-10-CM | POA: Diagnosis not present

## 2015-07-05 DIAGNOSIS — Z7982 Long term (current) use of aspirin: Secondary | ICD-10-CM

## 2015-07-05 DIAGNOSIS — E785 Hyperlipidemia, unspecified: Secondary | ICD-10-CM | POA: Diagnosis present

## 2015-07-05 DIAGNOSIS — A419 Sepsis, unspecified organism: Secondary | ICD-10-CM | POA: Diagnosis present

## 2015-07-05 DIAGNOSIS — Z96652 Presence of left artificial knee joint: Secondary | ICD-10-CM | POA: Diagnosis present

## 2015-07-05 DIAGNOSIS — I4510 Unspecified right bundle-branch block: Secondary | ICD-10-CM | POA: Insufficient documentation

## 2015-07-05 DIAGNOSIS — R61 Generalized hyperhidrosis: Secondary | ICD-10-CM

## 2015-07-05 DIAGNOSIS — R21 Rash and other nonspecific skin eruption: Secondary | ICD-10-CM | POA: Diagnosis present

## 2015-07-05 DIAGNOSIS — I10 Essential (primary) hypertension: Secondary | ICD-10-CM | POA: Diagnosis not present

## 2015-07-05 DIAGNOSIS — N189 Chronic kidney disease, unspecified: Secondary | ICD-10-CM | POA: Diagnosis present

## 2015-07-05 DIAGNOSIS — R112 Nausea with vomiting, unspecified: Secondary | ICD-10-CM | POA: Diagnosis not present

## 2015-07-05 DIAGNOSIS — I6330 Cerebral infarction due to thrombosis of unspecified cerebral artery: Secondary | ICD-10-CM | POA: Diagnosis not present

## 2015-07-05 DIAGNOSIS — I129 Hypertensive chronic kidney disease with stage 1 through stage 4 chronic kidney disease, or unspecified chronic kidney disease: Secondary | ICD-10-CM | POA: Diagnosis present

## 2015-07-05 DIAGNOSIS — G43A1 Cyclical vomiting, intractable: Secondary | ICD-10-CM | POA: Diagnosis not present

## 2015-07-05 DIAGNOSIS — I671 Cerebral aneurysm, nonruptured: Secondary | ICD-10-CM | POA: Diagnosis present

## 2015-07-05 DIAGNOSIS — R1110 Vomiting, unspecified: Secondary | ICD-10-CM

## 2015-07-05 DIAGNOSIS — R7309 Other abnormal glucose: Secondary | ICD-10-CM | POA: Diagnosis not present

## 2015-07-05 LAB — CBC WITH DIFFERENTIAL/PLATELET
BASOS ABS: 0 10*3/uL (ref 0.0–0.1)
BASOS PCT: 0 %
EOS PCT: 0 %
Eosinophils Absolute: 0 10*3/uL (ref 0.0–0.7)
HCT: 41.6 % (ref 39.0–52.0)
Hemoglobin: 13.8 g/dL (ref 13.0–17.0)
Lymphocytes Relative: 4 %
Lymphs Abs: 0.4 10*3/uL — ABNORMAL LOW (ref 0.7–4.0)
MCH: 30 pg (ref 26.0–34.0)
MCHC: 33.2 g/dL (ref 30.0–36.0)
MCV: 90.4 fL (ref 78.0–100.0)
MONO ABS: 0.4 10*3/uL (ref 0.1–1.0)
Monocytes Relative: 4 %
Neutro Abs: 10.2 10*3/uL — ABNORMAL HIGH (ref 1.7–7.7)
Neutrophils Relative %: 92 %
PLATELETS: 195 10*3/uL (ref 150–400)
RBC: 4.6 MIL/uL (ref 4.22–5.81)
RDW: 13.3 % (ref 11.5–15.5)
WBC: 11.1 10*3/uL — ABNORMAL HIGH (ref 4.0–10.5)

## 2015-07-05 LAB — I-STAT TROPONIN, ED: TROPONIN I, POC: 0 ng/mL (ref 0.00–0.08)

## 2015-07-05 LAB — LACTIC ACID, PLASMA
Lactic Acid, Venous: 3.6 mmol/L (ref 0.5–2.0)
Lactic Acid, Venous: 2.1 mmol/L (ref 0.5–2.0)

## 2015-07-05 LAB — LIPASE, BLOOD: LIPASE: 24 U/L (ref 22–51)

## 2015-07-05 LAB — COMPREHENSIVE METABOLIC PANEL
ALT: 34 U/L (ref 17–63)
AST: 27 U/L (ref 15–41)
Albumin: 4 g/dL (ref 3.5–5.0)
Alkaline Phosphatase: 89 U/L (ref 38–126)
Anion gap: 11 (ref 5–15)
BILIRUBIN TOTAL: 0.5 mg/dL (ref 0.3–1.2)
BUN: 17 mg/dL (ref 6–20)
CO2: 24 mmol/L (ref 22–32)
CREATININE: 1.16 mg/dL (ref 0.61–1.24)
Calcium: 9.1 mg/dL (ref 8.9–10.3)
Chloride: 99 mmol/L — ABNORMAL LOW (ref 101–111)
GFR calc Af Amer: >60 mL/min (ref >60)
GFR, EST NON AFRICAN AMERICAN: 56 mL/min — AB (ref >60)
Glucose, Bld: 219 mg/dL — ABNORMAL HIGH (ref 65–99)
Potassium: 3.8 mmol/L (ref 3.5–5.1)
Sodium: 134 mmol/L — ABNORMAL LOW (ref 135–145)
TOTAL PROTEIN: 7.3 g/dL (ref 6.5–8.1)

## 2015-07-05 LAB — APTT: aPTT: 29 seconds (ref 24–37)

## 2015-07-05 LAB — TROPONIN I: Troponin I: <0.03 ng/mL (ref <0.031)

## 2015-07-05 LAB — PROCALCITONIN: Procalcitonin: 0.13 ng/mL

## 2015-07-05 LAB — PROTIME-INR
INR: 0.98 (ref 0.00–1.49)
Prothrombin Time: 13.2 seconds (ref 11.6–15.2)

## 2015-07-05 LAB — GLUCOSE, CAPILLARY: Glucose-Capillary: 150 mg/dL — ABNORMAL HIGH (ref 65–99)

## 2015-07-05 MED ORDER — SODIUM CHLORIDE 0.9 % IV BOLUS (SEPSIS)
500.0000 mL | Freq: Once | INTRAVENOUS | Status: AC
  Administered 2015-07-05: 500 mL via INTRAVENOUS

## 2015-07-05 MED ORDER — ACETAMINOPHEN 325 MG PO TABS
650.0000 mg | ORAL_TABLET | Freq: Four times a day (QID) | ORAL | Status: DC | PRN
  Administered 2015-07-06 – 2015-07-09 (×3): 650 mg via ORAL
  Filled 2015-07-05 (×3): qty 2

## 2015-07-05 MED ORDER — ASPIRIN EC 81 MG PO TBEC: 167.5000 mg | DELAYED_RELEASE_TABLET | ORAL | Status: DC

## 2015-07-05 MED ORDER — INSULIN ASPART 100 UNIT/ML ~~LOC~~ SOLN
0.0000 [IU] | Freq: Three times a day (TID) | SUBCUTANEOUS | Status: DC
  Administered 2015-07-07 – 2015-07-10 (×3): 1 [IU] via SUBCUTANEOUS

## 2015-07-05 MED ORDER — SODIUM CHLORIDE 0.9 % IV BOLUS (SEPSIS): 1000.0000 mL | Freq: Once | INTRAVENOUS | Status: DC

## 2015-07-05 MED ORDER — ONDANSETRON HCL 4 MG/2ML IJ SOLN
4.0000 mg | Freq: Once | INTRAMUSCULAR | Status: AC
  Administered 2015-07-05: 4 mg via INTRAVENOUS
  Filled 2015-07-05: qty 2

## 2015-07-05 MED ORDER — ACETAMINOPHEN 650 MG RE SUPP: 650.0000 mg | Freq: Four times a day (QID) | RECTAL | Status: DC | PRN

## 2015-07-05 MED ORDER — HEPARIN SODIUM (PORCINE) 5000 UNIT/ML IJ SOLN
5000.0000 [IU] | Freq: Three times a day (TID) | INTRAMUSCULAR | Status: DC
  Administered 2015-07-05 – 2015-07-09 (×11): 5000 [IU] via SUBCUTANEOUS
  Filled 2015-07-05 (×10): qty 1

## 2015-07-05 MED ORDER — ASPIRIN 81 MG PO CHEW
324.0000 mg | CHEWABLE_TABLET | Freq: Once | ORAL | Status: AC
  Administered 2015-07-05: 162 mg via ORAL
  Filled 2015-07-05: qty 4

## 2015-07-05 MED ORDER — SODIUM CHLORIDE 0.9 % IV BOLUS (SEPSIS)
1500.0000 mL | Freq: Once | INTRAVENOUS | Status: AC
  Administered 2015-07-05: 1500 mL via INTRAVENOUS

## 2015-07-05 MED ORDER — ASPIRIN EC 81 MG PO TBEC
167.5000 mg | DELAYED_RELEASE_TABLET | Freq: Every day | ORAL | Status: DC
  Administered 2015-07-06 – 2015-07-08 (×3): 162 mg via ORAL
  Filled 2015-07-05 (×3): qty 2

## 2015-07-05 MED ORDER — MORPHINE SULFATE (PF) 2 MG/ML IV SOLN
2.0000 mg | INTRAVENOUS | Status: DC | PRN
  Administered 2015-07-05: 2 mg via INTRAVENOUS
  Filled 2015-07-05: qty 1

## 2015-07-05 MED ORDER — SODIUM CHLORIDE 0.9 % IV SOLN
INTRAVENOUS | Status: DC
  Administered 2015-07-05: 1000 mL via INTRAVENOUS
  Administered 2015-07-06 (×2): via INTRAVENOUS

## 2015-07-05 MED ORDER — AMLODIPINE BESYLATE 5 MG PO TABS
5.0000 mg | ORAL_TABLET | Freq: Every day | ORAL | Status: DC
  Administered 2015-07-06 – 2015-07-08 (×3): 5 mg via ORAL
  Filled 2015-07-05 (×3): qty 1

## 2015-07-05 MED ORDER — IOHEXOL 300 MG/ML  SOLN
100.0000 mL | Freq: Once | INTRAMUSCULAR | Status: AC | PRN
  Administered 2015-07-06: 100 mL via INTRAVENOUS

## 2015-07-05 MED ORDER — SODIUM CHLORIDE 0.9 % IJ SOLN
3.0000 mL | Freq: Two times a day (BID) | INTRAMUSCULAR | Status: DC
  Administered 2015-07-05 – 2015-07-11 (×10): 3 mL via INTRAVENOUS

## 2015-07-05 MED ORDER — IOHEXOL 300 MG/ML  SOLN: 25.0000 mL | Freq: Once | INTRAMUSCULAR | Status: DC | PRN

## 2015-07-05 MED ORDER — ATENOLOL 50 MG PO TABS: 50.0000 mg | ORAL_TABLET | ORAL | Status: DC

## 2015-07-05 MED ORDER — HYDROXYZINE HCL 50 MG/ML IM SOLN
25.0000 mg | Freq: Four times a day (QID) | INTRAMUSCULAR | Status: DC | PRN
  Administered 2015-07-05: 25 mg via INTRAMUSCULAR
  Filled 2015-07-05: qty 0.5
  Filled 2015-07-05: qty 1

## 2015-07-05 MED ORDER — PROMETHAZINE HCL 25 MG/ML IJ SOLN
12.5000 mg | Freq: Once | INTRAMUSCULAR | Status: AC
  Administered 2015-07-05: 12.5 mg via INTRAVENOUS
  Filled 2015-07-05: qty 1

## 2015-07-05 MED ORDER — HYDRALAZINE HCL 20 MG/ML IJ SOLN: 5.0000 mg | INTRAMUSCULAR | Status: DC | PRN

## 2015-07-05 NOTE — ED Notes (Signed)
Pt to ED via [facility] for nausea and vomiting - sudden onset at 0930 this morning; pt has no cardiac hx and EMS 12 lead shows RBBB. Pt has hx of HTN, diabetes, and BPH. Pt is from home. VSS. A/o x4.

## 2015-07-05 NOTE — Progress Notes (Signed)
New Admission Note:   Arrival Method: stretcher Mental Orientation:alert and oriented x 4  Telemetry:yes Assessment: Completed Skin:see flow sheet PG:FQMK hand Pain: Tubes:none Safety Measures: Safety Fall Prevention Plan has been  reviewed Admission 6 [facility] Orientation: Patient has been orientated to the room, unit and staff.  Family:none available  Orders have been reviewed and implemented. Will continue to monitor the patient. Call light has been placed within reach and bed alarm has been activated.   Amado Coe, RN Phone number: [phone]

## 2015-07-05 NOTE — ED Provider Notes (Signed)
CSN: [id number]     Arrival date & time 07/05/15  1552 History   First MD Initiated Contact with Patient 07/05/15 1601     Chief Complaint  Patient presents with  . Nausea  . Emesis   Patient is a 81 y.o. male presenting with general illness. The history is provided by the patient and a relative. No language interpreter was used.  Illness Location:  Generalized Quality:  N/V Severity:  Moderate Onset quality:  Sudden Timing:  Constant Progression:  Unchanged Chronicity:  New Context:  PMHx of HTN, HLD, pre-DM, CKD, previous TOB use (2PPD x73yrs, quit 53yrs ago) presenting with N/V/D. Notes very loose nonbloody stools a couple days ago. Sudden onset of nausea and nonbloody nonbilious emesis at 9:30am. Awoke at 6:00am. Had toast and cereal for breakfast.. Associated with diaphoresis. Denies CP, SOB, abdominal pain, constipation, hematochezia, melena, or urinary symptoms, or previous history of similar symptoms. Denies recent travel/antibiotics, suspicious food intake, or sick contacts Associated symptoms: cough (infrequent), diarrhea (very loose, non-bloody), nausea and vomiting (NBNB)   Associated symptoms: no abdominal pain, no chest pain, no fever, no headaches, no loss of consciousness, no rhinorrhea and no shortness of breath     Past Medical History  Diagnosis Date  . Hypertension   . PSA elevation   . Arthritis   . Pre-diabetes   . Chronic kidney disease     stone 1990   Past Surgical History  Procedure Laterality Date  . Knee surgery    . Replacement total knee  11/20/10    left  . Joint replacement    . Inguinal hernia repair  10/06/2011    Procedure: HERNIA REPAIR INGUINAL ADULT;  Surgeon: Harl Bowie, MD;  Location: [facility];  Service: General;  Laterality: Left;  Open Left Inguinal Hernia Repair with Mesh  . Umbilical hernia repair  10/06/2011    Procedure: HERNIA REPAIR UMBILICAL ADULT;  Surgeon: Harl Bowie, MD;  Location: [facility];  Service: General;   Laterality: N/A;   Family History  Problem Relation Age of Onset  . Heart disease Father    Social History  Substance Use Topics  . Smoking status: Former Smoker -- 2.00 packs/day for 18 years    Types: Cigarettes, Cigars    Quit date: 10/03/1962  . Smokeless tobacco: Former Systems developer    Types: Chew  . Alcohol Use: Yes     Comment: vodka socially    Review of Systems  Constitutional: Positive for appetite change. Negative for fever and chills.  HENT: Negative for rhinorrhea.   Respiratory: Positive for cough (infrequent). Negative for shortness of breath.   Cardiovascular: Negative for chest pain, palpitations and leg swelling.  Gastrointestinal: Positive for nausea, vomiting (NBNB) and diarrhea (very loose, non-bloody). Negative for abdominal pain.  Genitourinary: Positive for frequency (baseline). Negative for hematuria.  Neurological: Negative for loss of consciousness and headaches.  All other systems reviewed and are negative.   Allergies  Review of patient's allergies indicates no known allergies.  Home Medications   Prior to Admission medications   Medication Sig Start Date End Date Taking? Authorizing Provider  aspirin 325 MG EC tablet Take 167.5 mg by mouth every morning.    Yes Historical Provider, MD  finasteride (PROSCAR) 5 MG tablet Take 5 mg by mouth daily.   Yes Historical Provider, MD  hydrochlorothiazide (HYDRODIURIL) 50 MG tablet Take 25 mg by mouth every morning.    Yes Historical Provider, MD  Polyvinyl Alcohol-Povidone (REFRESH OP) Place 1  drop into the right eye daily as needed. For dry eye per patient   Yes Historical Provider, MD  terazosin (HYTRIN) 10 MG capsule Take 10 mg by mouth at bedtime.   Yes Historical Provider, MD  atenolol (TENORMIN) 100 MG tablet Take 50 mg by mouth every morning. Total dose 50 mg q am    Historical Provider, MD   BP 154/79 mmHg  Pulse 86  Temp(Src) 98.9 F (37.2 C) (Oral)  Resp 22  Ht 5\' 9"  (1.753 m)  Wt 206 lb 12.8 oz  (93.804 kg)  BMI 30.53 kg/m2  SpO2 95%   Physical Exam  Constitutional: He is oriented to person, place, and time. He appears distressed (2/2 nausea).  HENT:  Head: Normocephalic and atraumatic.  Eyes: Conjunctivae are normal. Pupils are equal, round, and reactive to light.  Neck: Normal range of motion. Neck supple.  Cardiovascular: Normal rate, regular rhythm and intact distal pulses.   Pulmonary/Chest: Effort normal.  Coarse breath sounds at bases bilaterally  Abdominal: Soft. Bowel sounds are normal. He exhibits no distension. There is no tenderness.  Musculoskeletal: Normal range of motion.  Neurological: He is alert and oriented to person, place, and time.  Skin: Skin is warm. He is diaphoretic.  Nursing note and vitals reviewed.   ED Course  Procedures   Labs Review Labs Reviewed  COMPREHENSIVE METABOLIC PANEL - Abnormal; Notable for the following:    Sodium 134 (*)    Chloride 99 (*)    Glucose, Bld 219 (*)    GFR calc non Af Amer 56 (*)    All other components within normal limits  CBC WITH DIFFERENTIAL/PLATELET - Abnormal; Notable for the following:    WBC 11.1 (*)    Neutro Abs 10.2 (*)    Lymphs Abs 0.4 (*)    All other components within normal limits  LACTIC ACID, PLASMA - Abnormal; Notable for the following:    Lactic Acid, Venous 3.6 (*)    All other components within normal limits  LACTIC ACID, PLASMA - Abnormal; Notable for the following:    Lactic Acid, Venous 2.1 (*)    All other components within normal limits  GLUCOSE, CAPILLARY - Abnormal; Notable for the following:    Glucose-Capillary 150 (*)    All other components within normal limits  CULTURE, BLOOD (ROUTINE X 2)  CULTURE, BLOOD (ROUTINE X 2)  LIPASE, BLOOD  PROTIME-INR  APTT  TROPONIN I  PROCALCITONIN  TROPONIN I  TROPONIN I  HEMOGLOBIN L2G  BASIC METABOLIC PANEL  CBC  URINALYSIS, ROUTINE W REFLEX MICROSCOPIC (NOT AT [facility])  Randolm Idol, ED   Imaging Review Dg Chest 2  View  07/05/2015   CLINICAL DATA:  Nausea vomiting for 9 hours. Occasional cough. No chest pain. Coarse basilar breast sounds.  EXAM: CHEST  2 VIEW  COMPARISON:  11/23/2010  FINDINGS: Cardiac silhouette is normal in size and configuration. Aorta is mildly tortuous. No mediastinal or hilar masses or evidence of adenopathy.  Mild lung base opacity is noted, similar to the prior chest radiographs, consistent with atelectasis. No convincing pneumonia no pulmonary edema. No pleural effusion or pneumothorax.  Bony thorax is demineralized but grossly intact.  IMPRESSION: 1. No acute cardiopulmonary disease. 2. Basilar atelectasis similar to the prior chest radiograph.   Electronically Signed   By: Lajean Manes M.D.   On: 07/05/2015 18:29   I have personally reviewed and evaluated these images and lab results as part of my medical decision-making.   EKG  Interpretation   Date/Time:  Saturday July 05 2015 15:57:03 EDT Ventricular Rate:  81 PR Interval:  235 QRS Duration: 158 QT Interval:  456 QTC Calculation: 529 R Axis:   -43 Text Interpretation:  Sinus rhythm Prolonged PR interval RBBB and LAFB  RBBB new since last EKG in 2012  Confirmed by [facility] MD, DANA [phone]) on  07/05/2015 4:12:07 PM      MDM  Mr. Skeels is an 81 yo male w/ PMHx of HTN, HLD, pre-DM, CKD, previous TOB use (2PPD x56yrs, quit 16yrs ago) presenting with N/V/D. Notes very loose nonbloody stools a couple days ago. Sudden onset of nausea and nonbloody nonbilious emesis at 9:30am. Awoke at 6:00am. Had toast and cereal for breakfast.. Associated with diaphoresis. Denies CP, SOB, abdominal pain, constipation, hematochezia, melena, or urinary symptoms, or previous history of similar symptoms. Denies recent travel, recent antibiotics, suspicious food intake, or sick contacts at home.  Exam above notable for elderly male sitting up in stretcher in moderate distress secondary to nausea. Afebrile. Heart rate 70s to 90s. Mildly hypertensive  with SBP's in 160s. Not tachypneic. Maintaining saturations at 96% on room air.  Aspirin given. EKG showing new right pulmonary block when compared to EKG in 2012 but no evidence of ST elevation/depression or other T-wave changes. CMP relatively unremarkable. Lipase 24. WBC 11.1. Symptoms may be related to gastroenteritis but given sudden onset in conjunction with diaphoresis and related risk factors for ACS patient was admitted for further risk stratification.  Patient admitted to hospital service for further evaluation and management of above-stated symptoms. Patient and family understand and agree with plan have no further questions or concerns this time.  History discussed with followed by my attending, Dr. Brantley Stage   Final diagnoses:  Nausea and vomiting in adult  Diaphoresis  Vomiting    Mayer Camel, MD 07/06/15 6503  Forde Dandy, MD 07/06/15 1218

## 2015-07-05 NOTE — ED Notes (Signed)
MD at bedside. 

## 2015-07-05 NOTE — H&P (Addendum)
[facility] History and Physical  [employer] OYD:[id number] DOB: 07-15-1930 DOA: 07/05/2015  Referring physician: ED physician PCP: Leonard Downing, MD  Specialists:   Chief Complaint: Nausea, vomiting  HPI: Travis [name]. is a 81 y.o. male with PMH of prediabetes, hypertension, arthritis, elevated PSA, who presents with nausea, vomiting.  Patient reports that he started having sudden onset nausea and vomiting at about 9:30 AM. He does not have abdominal pain or diarrhea. He vomited more than 10 times with yellow colored materials, without blood in the vomitus. Last bowel movement was in the morning. He reports that 2 days ago, he had several episodes of diarrhea, which have resolved spontaneously. Patient reports that he has chronic mild dysuria and burning on urination, which have not changed. He does not have chest pain, shortness of breath, fever or chills. He has mild dry cough, which is chronic issue and has not changed. Patient reports that he developed few papular rashes over his front upper chest and left neck after he received Benadryl in the emergency room.  In ED, patient was found to have negative troponin, lipase 24, WBC 11.1, tachycardia, tachypnea, temperature normal, electrolytes okay, negative chest x-ray, blood sugar 219 by BMP. Patient's admitted to inpatient for further evaluation treatment.  Where does patient live?   At home    Can patient participate in ADLs? Some   Review of Systems:   General: no fevers, chills, no changes in body weight, has poor appetite, has fatigue HEENT: no blurry vision, hearing changes or sore throat Pulm: no dyspnea, has coughing, no wheezing CV: no chest pain, palpitations Abd: has nausea, vomiting, no abdominal pain, had diarrhea, no constipation GU: no dysuria, burning on urination, increased urinary frequency, hematuria  Ext: no leg edema Neuro: no unilateral weakness, numbness, or tingling, no vision change or  hearing loss Skin: no rash MSK: No muscle spasm, no deformity, no limitation of range of movement in spin Heme: No easy bruising.  Travel history: No recent long distant travel.  Allergy: No Known Allergies  Past Medical History  Diagnosis Date  . Hypertension   . PSA elevation   . Arthritis   . Pre-diabetes   . Chronic kidney disease     stone 1990    Past Surgical History  Procedure Laterality Date  . Knee surgery    . Replacement total knee  11/20/10    left  . Joint replacement    . Inguinal hernia repair  10/06/2011    Procedure: HERNIA REPAIR INGUINAL ADULT;  Surgeon: Harl Bowie, MD;  Location: [facility];  Service: General;  Laterality: Left;  Open Left Inguinal Hernia Repair with Mesh  . Umbilical hernia repair  10/06/2011    Procedure: HERNIA REPAIR UMBILICAL ADULT;  Surgeon: Harl Bowie, MD;  Location: [facility];  Service: General;  Laterality: N/A;    Social History:  reports that he quit smoking about 52 years ago. His smoking use included Cigarettes and Cigars. He has a 36 pack-year smoking history. He has quit using smokeless tobacco. His smokeless tobacco use included Chew. He reports that he drinks alcohol. He reports that he does not use illicit drugs.  Family History:  Family History  Problem Relation Age of Onset  . Heart disease Father      Prior to Admission medications   Medication Sig Start Date End Date Taking? Authorizing Provider  aspirin 325 MG EC tablet Take 167.5 mg by mouth every morning.  Historical Provider, MD  atenolol (TENORMIN) 100 MG tablet Take 50 mg by mouth every morning. Total dose 50 mg q am    Historical Provider, MD  hydrochlorothiazide (HYDRODIURIL) 50 MG tablet Take 25 mg by mouth every morning.     Historical Provider, MD    Physical Exam: Filed Vitals:   07/05/15 1715 07/05/15 1730 07/05/15 1800 07/05/15 1900  BP: 148/74 161/74 159/79 160/76  Pulse: 77 77 73 85  Temp:      TempSrc:      Resp: 21   26  SpO2:  93% 94% 96% 92%   General: Not in acute distress HEENT:       Eyes: PERRL, EOMI, no scleral icterus.       ENT: No discharge from the ears and nose, no pharynx injection, no tonsillar enlargement.        Neck: No JVD, no bruit, no mass felt. Heme: No neck lymph node enlargement. Cardiac: S1/S2, RRR, No murmurs, No gallops or rubs. Pulm:  No rales, wheezing, rhonchi or rubs. Abd: Soft, mildly distended, mild tenderness diffusely, no rebound pain, no organomegaly, BS present. Ext: No pitting leg edema bilaterally. 2+DP/PT pulse bilaterally. Musculoskeletal: No joint deformities, No joint redness or warmth, no limitation of ROM in spin. Skin: No rashes. Has a few papular rashes over front chest and left neck Neuro: Alert, oriented X3, cranial nerves II-XII grossly intact, muscle strength 5/5 in all extremities, sensation to light touch intact.  Psych: Patient is not psychotic, no suicidal or hemocidal ideation.  Labs on Admission:  Basic Metabolic Panel:  Recent Labs Lab 07/05/15 1654  NA 134*  K 3.8  CL 99*  CO2 24  GLUCOSE 219*  BUN 17  CREATININE 1.16  CALCIUM 9.1   Liver Function Tests:  Recent Labs Lab 07/05/15 1654  AST 27  ALT 34  ALKPHOS 89  BILITOT 0.5  PROT 7.3  ALBUMIN 4.0    Recent Labs Lab 07/05/15 1654  LIPASE 24   No results for input(s): AMMONIA in the last 168 hours. CBC:  Recent Labs Lab 07/05/15 1654  WBC 11.1*  NEUTROABS 10.2*  HGB 13.8  HCT 41.6  MCV 90.4  PLT 195   Cardiac Enzymes: No results for input(s): CKTOTAL, CKMB, CKMBINDEX, TROPONINI in the last 168 hours.  BNP (last 3 results) No results for input(s): BNP in the last 8760 hours.  ProBNP (last 3 results) No results for input(s): PROBNP in the last 8760 hours.  CBG: No results for input(s): GLUCAP in the last 168 hours.  Radiological Exams on Admission: Dg Chest 2 View  07/05/2015   CLINICAL DATA:  Nausea vomiting for 9 hours. Occasional cough. No chest pain.  Coarse basilar breast sounds.  EXAM: CHEST  2 VIEW  COMPARISON:  11/23/2010  FINDINGS: Cardiac silhouette is normal in size and configuration. Aorta is mildly tortuous. No mediastinal or hilar masses or evidence of adenopathy.  Mild lung base opacity is noted, similar to the prior chest radiographs, consistent with atelectasis. No convincing pneumonia no pulmonary edema. No pleural effusion or pneumothorax.  Bony thorax is demineralized but grossly intact.  IMPRESSION: 1. No acute cardiopulmonary disease. 2. Basilar atelectasis similar to the prior chest radiograph.   Electronically Signed   By: Lajean Manes M.D.   On: 07/05/2015 18:29    EKG: Independently reviewed.  Abnormal findings: QTC 529, first degree AV block, new bifascicular block compare with EKG on 2012. Assessment/Plan Principal Problem:   Nausea & vomiting Active  Problems:   Hypertension   Arthritis   Pre-diabetes   Rash  Nausea & vomiting: Etiology is not clear, differential diagnosis includes viral gastritis, small bowel obstruction given distended abdomen and mild abdominal tenderness, atypical presentation of heart attack. Lipase 24, less likely to have pancreatitis. Patient is not septic on admission, hemodynamically stable.  -will admit to tele bed -CT-abd-pelvis to r/u bowel obstruction -When necessary hydroxyzine for nausea (patient has prolonged QTc interval 529, not a good candidate for Zofran). -When necessary morphine for pain -trop x 3 and repeat EKG in AM -IVF: 500 cc x 2 and then 100 cc/h NS -will get Procalcitonin and trend lactic acid levels  Addendum: Patient's lactate comes back 3.6, plus tachycardia, patient meets criteria for sepsis now. -will give another dose of NS bolus: 1500 cc x 1  Addendum: CT-abd-pelvis showed no SBO, but showed mild intrahepatic biliary ductal dilatation, this is new from prior exam. Common bile duct appears prominent measuring 9 mm. No abnormal gallbladder distention.  -will get  US-abdomen  HTN:  -pt is on Atenolol at home, and has first degree AV block and bifascicular block, will hold atenolol -hold HCTZ since patient needs IV fluid -IV hydralazine when necessary -start low dose of amlodipine now, 5 mg daily  Pre-diabetes: No A1c is on record. Blood sugar is elevated at 219 on admission -Sliding-scale insulin -Check A1c level  Arthritis:  -When necessary Tylenol  Rash: Few rash over front chest and left neck, unclear etiology. Asymptomatic. - Observe closely  Chronic dysuria and burning on urination:  -check urinalysis    DVT ppx: SQ Heparin    Code Status: Full code Family Communication:  Yes, patient's wife and 2 daughters  at bed side Disposition Plan: Admit to inpatient   Date of Service 07/05/2015    Ivor Costa [facility] Pager [phone]  If 7PM-7AM, please contact night-coverage [email or web address] Password [facility] 07/05/2015, 7:58 PM

## 2015-07-06 ENCOUNTER — Inpatient Hospital Stay (HOSPITAL_COMMUNITY): Payer: Medicare Other

## 2015-07-06 ENCOUNTER — Encounter (HOSPITAL_COMMUNITY): Payer: Self-pay | Admitting: Radiology

## 2015-07-06 DIAGNOSIS — G43A1 Cyclical vomiting, intractable: Secondary | ICD-10-CM

## 2015-07-06 LAB — BASIC METABOLIC PANEL
Anion gap: 7 (ref 5–15)
BUN: 11 mg/dL (ref 6–20)
CALCIUM: 8.7 mg/dL — AB (ref 8.9–10.3)
CHLORIDE: 107 mmol/L (ref 101–111)
CO2: 26 mmol/L (ref 22–32)
CREATININE: 1.08 mg/dL (ref 0.61–1.24)
Glucose, Bld: 125 mg/dL — ABNORMAL HIGH (ref 65–99)
Potassium: 4.1 mmol/L (ref 3.5–5.1)
SODIUM: 140 mmol/L (ref 135–145)

## 2015-07-06 LAB — URINALYSIS, ROUTINE W REFLEX MICROSCOPIC
Bilirubin Urine: NEGATIVE
GLUCOSE, UA: NEGATIVE mg/dL
HGB URINE DIPSTICK: NEGATIVE
Ketones, ur: NEGATIVE mg/dL
Leukocytes, UA: NEGATIVE
Nitrite: NEGATIVE
Protein, ur: NEGATIVE mg/dL
SPECIFIC GRAVITY, URINE: 1.039 — AB (ref 1.005–1.030)
Urobilinogen, UA: 0.2 mg/dL (ref 0.0–1.0)
pH: 6 (ref 5.0–8.0)

## 2015-07-06 LAB — CBC
HEMATOCRIT: 37.6 % — AB (ref 39.0–52.0)
Hemoglobin: 12.4 g/dL — ABNORMAL LOW (ref 13.0–17.0)
MCH: 30 pg (ref 26.0–34.0)
MCHC: 33 g/dL (ref 30.0–36.0)
MCV: 90.8 fL (ref 78.0–100.0)
Platelets: 213 10*3/uL (ref 150–400)
RBC: 4.14 MIL/uL — AB (ref 4.22–5.81)
RDW: 13.4 % (ref 11.5–15.5)
WBC: 8.3 10*3/uL (ref 4.0–10.5)

## 2015-07-06 LAB — TROPONIN I: TROPONIN I: 0.03 ng/mL (ref <0.031)

## 2015-07-06 LAB — GLUCOSE, CAPILLARY
GLUCOSE-CAPILLARY: 117 mg/dL — AB (ref 65–99)
Glucose-Capillary: 108 mg/dL — ABNORMAL HIGH (ref 65–99)
Glucose-Capillary: 144 mg/dL — ABNORMAL HIGH (ref 65–99)

## 2015-07-06 MED ORDER — MECLIZINE HCL 25 MG PO TABS: 25.0000 mg | ORAL_TABLET | Freq: Three times a day (TID) | ORAL | Status: DC | PRN

## 2015-07-06 MED ORDER — ONDANSETRON HCL 4 MG/2ML IJ SOLN
4.0000 mg | Freq: Four times a day (QID) | INTRAMUSCULAR | Status: DC | PRN
  Administered 2015-07-06: 4 mg via INTRAVENOUS
  Filled 2015-07-06: qty 2

## 2015-07-06 NOTE — Progress Notes (Signed)
Patient Demographics  Travis [name], is a 81 y.o. male, DOB - 1929-12-19, YNW:[id number]  Admit date - 07/05/2015   Admitting Physician Ivor Costa, MD  Outpatient Primary MD for the patient is Leonard Downing, MD  LOS - 1   Chief Complaint  Patient presents with  . Nausea  . Emesis         Subjective:   Travis [name] today has, No headache, No chest pain, No abdominal pain -  No Cough - SOB. Patient denies any nausea or vomiting since admission, still complains of dizziness/vertigo .  Assessment & Plan    Principal Problem:   Nausea & vomiting Active Problems:   Hypertension   Arthritis   Pre-diabetes   Rash   Sepsis  Nausea and vomiting - This is most likely related to gastroenteritis, CT abdomen and pelvis with no evidence of SBO, but showed mild intrahepatic biliary ductal dilatation, LFT within normal limit, right upper quadrant ultrasound with no acute abnormalities. - Most likely related to viral gastroenteritis - Possibility of vertigo contributing to his symptoms.  Vertigo/dizziness - Possible dehydration from nausea and vomiting, as well vertigo on permanent as well. - CT head with evidence of age-indeterminate infarct, will proceed with MRI brain to [place] out acute CVA causing these symptoms.  Hypertension - Continue to hold atenolol and hydrochlorothiazide - Started on Norvasc and when necessary hydralazine  Prediabetes - Continue to monitor necessarily scale - Follow on hemoglobin A1c   Code Status: Full  Family Communication: Multiple family members at bedside  Disposition Plan: Pending further workup   Procedures  None   Consults   None   Medications  Scheduled Meds: . amLODipine  5 mg Oral Daily  . aspirin  162 mg Oral Daily  . heparin  5,000 Units Subcutaneous 3 times per day  . insulin aspart  0-9 Units Subcutaneous TID WC  . sodium chloride   3 mL Intravenous Q12H   Continuous Infusions: . sodium chloride 100 mL/hr at 07/06/15 0948   PRN Meds:.acetaminophen **OR** acetaminophen, hydrALAZINE, hydrOXYzine, iohexol, meclizine, morphine injection, ondansetron (ZOFRAN) IV  DVT Prophylaxis  - Heparin   Lab Results  Component Value Date   PLT 213 07/06/2015    Antibiotics    Anti-infectives    None          Objective:   Filed Vitals:   07/05/15 1900 07/05/15 2104 07/06/15 0456 07/06/15 0938  BP: 160/76 154/79 154/75 159/74  Pulse: 85 86 74 89  Temp:  98.9 F (37.2 C) 98.3 F (36.8 C) 98.6 F (37 C)  TempSrc:  Oral Oral Oral  Resp: 26 22 20 19   Height:  5\' 9"  (1.753 m)    Weight:  93.804 kg (206 lb 12.8 oz)    SpO2: 92% 95% 97% 98%    Wt Readings from Last 3 Encounters:  07/05/15 93.804 kg (206 lb 12.8 oz)  10/28/11 96.616 kg (213 lb)  10/04/11 96.163 kg (212 lb)     Intake/Output Summary (Last 24 hours) at 07/06/15 1623 Last data filed at 07/06/15 1307  Gross per 24 hour  Intake 1708.33 ml  Output    702 ml  Net 1006.33 ml     Physical Exam  Awake Alert, Oriented  Supple Neck,No JVD, No cervical lymphadenopathy appriciated.  Symmetrical Chest wall movement, Good air movement bilaterally,  RRR,No Gallops,Rubs or new Murmurs, No Parasternal Heave +ve B.Sounds, Abd Soft, No tenderness, No organomegaly appriciated, No Cyanosis, Clubbing or edema, No new Rash or bruise     Data Review   Micro Results Recent Results (from the past 240 hour(s))  Culture, blood (x 2)     Status: None (Preliminary result)   Collection Time: 07/05/15  8:26 PM  Result Value Ref Range Status   Specimen Description BLOOD RIGHT ARM  Final   Special Requests   Final    BOTTLES DRAWN AEROBIC AND ANAEROBIC 5ML AEB 1ML ANA   Culture NO GROWTH < 24 HOURS  Final   Report Status PENDING  Incomplete  Culture, blood (x 2)     Status: None (Preliminary result)   Collection Time: 07/05/15  8:30 PM  Result Value Ref Range  Status   Specimen Description BLOOD LEFT ARM  Final   Special Requests   Final    BOTTLES DRAWN AEROBIC AND ANAEROBIC 5ML AEB 1ML ANA   Culture NO GROWTH < 24 HOURS  Final   Report Status PENDING  Incomplete    Radiology Reports Dg Chest 2 View  07/05/2015   CLINICAL DATA:  Nausea vomiting for 9 hours. Occasional cough. No chest pain. Coarse basilar breast sounds.  EXAM: CHEST  2 VIEW  COMPARISON:  11/23/2010  FINDINGS: Cardiac silhouette is normal in size and configuration. Aorta is mildly tortuous. No mediastinal or hilar masses or evidence of adenopathy.  Mild lung base opacity is noted, similar to the prior chest radiographs, consistent with atelectasis. No convincing pneumonia no pulmonary edema. No pleural effusion or pneumothorax.  Bony thorax is demineralized but grossly intact.  IMPRESSION: 1. No acute cardiopulmonary disease. 2. Basilar atelectasis similar to the prior chest radiograph.   Electronically Signed   By: Lajean Manes M.D.   On: 07/05/2015 18:29   Ct Head Wo Contrast  07/06/2015   CLINICAL DATA:  Vertigo  EXAM: CT HEAD WITHOUT CONTRAST  TECHNIQUE: Contiguous axial images were obtained from the base of the skull through the vertex without intravenous contrast.  COMPARISON:  None.  FINDINGS: No skull fracture is noted. No intracranial hemorrhage or midline shift. There is large area of decreased attenuation in the left cerebellum highly suspicious for ischemic of indeterminate age. Clinical correlation is necessary. Further correlation with MRI with diffusion imaging is recommended. Mild cerebral atrophy. Periventricular patchy subcortical white matter decreased attenuation probable due to chronic small vessel ischemic changes.  IMPRESSION: 1. There is area of decreased attenuation in left cerebellum highly suspicious for infarcts of indeterminate age. Subacute infarct cannot be excluded. Clinical correlation is necessary. Further correlation with MRI is recommended as clinically  warranted. No intracranial hemorrhage or midline shift. Mild cerebral atrophy. Periventricular and patchy subcortical white matter decreased attenuation probable due to chronic small vessel ischemic changes.   Electronically Signed   By: Lahoma Crocker M.D.   On: 07/06/2015 15:51   US Abdomen Complete  07/06/2015   CLINICAL DATA:  Evaluate biliary dilatation seen on recent CT scan.  EXAM: ULTRASOUND ABDOMEN COMPLETE  COMPARISON:  CT scan 07/06/2015  FINDINGS: Gallbladder: Mobile echogenic foci in the gallbladder without acoustic shadowing. Possible small gallstones. There are also areas of ring down artifact most consistent with adenomyomatosis. No pericholecystic fluid or sonographic Murphy sign to suggest acute cholecystitis.  Common bile duct: Diameter: 8.0 mm  Liver: Intrahepatic biliary  dilatation not well demonstrated. No focal hepatic lesions. Normal echogenicity.  IVC: Normal caliber.  Pancreas: Sonographically unremarkable.  Spleen: Normal size and echogenicity without focal lesions.  Right Kidney: Length: 11.2 cm. Normal renal cortical thickness and echogenicity without focal lesions or hydronephrosis.  Left Kidney: Length: 12.2 cm. Normal renal cortical thickness and echogenicity without focal lesions or hydronephrosis.  Abdominal aorta: Normal caliber.  Other findings: No ascites.  IMPRESSION: 1. Probable small gallstones and changes of adenomyomatosis. No findings for acute cholecystitis. 2. Normal caliber common bile duct for age. No common bile duct stones. 3. Unremarkable sonographic appearance of the liver, pancreas, spleen and both kidneys.   Electronically Signed   By: Marijo Sanes M.D.   On: 07/06/2015 10:38   Ct Abdomen Pelvis W Contrast  07/06/2015   CLINICAL DATA:  New onset nausea and vomiting. Diffuse abdominal tenderness. [place] out bowel obstruction.  EXAM: CT ABDOMEN AND PELVIS WITH CONTRAST  TECHNIQUE: Multidetector CT imaging of the abdomen and pelvis was performed using the standard  protocol following bolus administration of intravenous contrast.  CONTRAST:  170mL OMNIPAQUE IOHEXOL 300 MG/ML  SOLN  COMPARISON:  01/07/2012  FINDINGS: Lower chest: Volume loss in both lower lobes with linear atelectasis.  Liver: Mild intrahepatic biliary ductal dilatation. Tiny 6 mm hypodensity in the subcapsular right hepatic lobe, too small to characterize.  Hepatobiliary: Prominent common bile duct measuring 9 mm at the porta hepatis. Gallbladder is physiologically distended. Small layering stones or sludge.  Pancreas: No pancreatic ductal dilatation or surrounding inflammation. Mildly atrophic.  Spleen: Normal in size without focal lesion. Trace perisplenic fluid.  Adrenal glands: No nodule.  Kidneys: Symmetric renal enhancement. No hydronephrosis. Small bilateral cortical cysts.  Stomach/Bowel: Small hiatal hernia. Stomach physiologically distended. There are no dilated or thickened small bowel loops. Small-moderate volume of stool throughout the colon without colonic wall thickening. Cecum is high-riding in the mid abdomen. The appendix is normal.  Vascular/Lymphatic: No retroperitoneal adenopathy. Abdominal aorta is normal in caliber. Atherosclerosis of the abdominal aorta without aneurysm.  Reproductive: Marked enlargement of the prostate gland, measuring at least 9.4 x 8.4 cm in by axial dimension. This causes mass effect on the bladder base.  Bladder: Distended.  No wall thickening.  Other: No free air, free fluid, or intra-abdominal fluid collection. Fat containing bilateral inguinal hernias, left larger than right. No pelvic adenopathy.  Musculoskeletal: There are no acute or suspicious osseous abnormalities.  IMPRESSION: 1. No bowel obstruction.  No signs of bowel inflammation. 2. Mild intrahepatic biliary ductal dilatation, this is new from prior exam. Common bile duct appears prominent measuring 9 mm. No abnormal gallbladder distention. Significance is uncertain. If patient is able tolerate breath  hold technique in there is clinical concern for hepatobiliary pathology, an MRCP could be considered. 3. Marked enlargement of the prostate gland, similar to prior exam.   Electronically Signed   By: Jeb Levering M.D.   On: 07/06/2015 01:12     CBC  Recent Labs Lab 07/05/15 1654 07/06/15 0539  WBC 11.1* 8.3  HGB 13.8 12.4*  HCT 41.6 37.6*  PLT 195 213  MCV 90.4 90.8  MCH 30.0 30.0  MCHC 33.2 33.0  RDW 13.3 13.4  LYMPHSABS 0.4*  --   MONOABS 0.4  --   EOSABS 0.0  --   BASOSABS 0.0  --     Chemistries   Recent Labs Lab 07/05/15 1654 07/06/15 0539  NA 134* 140  K 3.8 4.1  CL 99* 107  CO2  24 26  GLUCOSE 219* 125*  BUN 17 11  CREATININE 1.16 1.08  CALCIUM 9.1 8.7*  AST 27  --   ALT 34  --   ALKPHOS 89  --   BILITOT 0.5  --    ------------------------------------------------------------------------------------------------------------------ estimated creatinine clearance is 58.6 mL/min (by C-G formula based on Cr of 1.08). ------------------------------------------------------------------------------------------------------------------ No results for input(s): HGBA1C in the last 72 hours. ------------------------------------------------------------------------------------------------------------------ No results for input(s): CHOL, HDL, LDLCALC, TRIG, CHOLHDL, LDLDIRECT in the last 72 hours. ------------------------------------------------------------------------------------------------------------------ No results for input(s): TSH, T4TOTAL, T3FREE, THYROIDAB in the last 72 hours.  Invalid input(s): FREET3 ------------------------------------------------------------------------------------------------------------------ No results for input(s): VITAMINB12, FOLATE, FERRITIN, TIBC, IRON, RETICCTPCT in the last 72 hours.  Coagulation profile  Recent Labs Lab 07/05/15 2026  INR 0.98    No results for input(s): DDIMER in the last 72 hours.  Cardiac  Enzymes  Recent Labs Lab 07/05/15 2026 07/06/15 0124 07/06/15 0700  TROPONINI <0.03 <0.03 0.03   ------------------------------------------------------------------------------------------------------------------ Invalid input(s): POCBNP     Time Spent in minutes   35 minutes   Michalina Calbert M.D on 07/06/2015 at 4:23 PM  Between 7am to 7pm - Pager - [phone]  After 7pm go to [email or web address] - password [facility]  [facility]   Office  [phone]

## 2015-07-06 NOTE — Progress Notes (Signed)
Pt is refusing exam. Pt states he hasn't had a stroke, he just ate and he feels better. Pt was offered a scan w/o face plate (he didn't want that thing in front of his face, and I offered to use the acoustic reduction technology we have on the 3T scanner. This was pt's 2 largest complaints. He is still refusing. Said he didn't think it was worth running a bill up with all these test. Called pt's RN before sending pt back as she had called prior to warn me of pt's dislike of the MRI and it's noise. Pt has asked to discuss necessity of exam with his MD.

## 2015-07-06 NOTE — Progress Notes (Signed)
Utilization Review Completed.Dowell, Travis [name]  

## 2015-07-06 NOTE — Evaluation (Signed)
Physical Therapy Evaluation Patient Details Name: Travis [name]. MRN: [id number] DOB: [birth year] Today's Date: 07/06/2015   History of Present Illness  Travis [name]. is a 81 y.o. male with PMH of prediabetes, hypertension, arthritis, elevated PSA, who presents with nausea, vomiting. Reports nausea typically occurs with moving/changing positions, increased symptoms of nausea with brief gaze stabilization assessment  Clinical Impression   Pt admitted with above diagnosis. Pt currently with functional limitations due to the deficits listed below (see PT Problem List).  Pt will benefit from skilled PT to increase their independence and safety with mobility to allow discharge to the venue listed below.    Reproduction of symptoms of nausea with brief gaze stabilization assessment; not smooth eye movements looking from one target to another; suspect Vestibular involvement, and given relatively quick onset of symptoms, it is worth considering head imaging to Travis [name] in/out CVA; Paged Dr. Waldron [name], who placed orders for Vestibular assessment and imaging of head     Follow Up Recommendations Home health PT;Outpatient PT;Other (comment) ([facility] followed by Outpatient for Vestibular rehab; This will be dependent on pt's progress and hospital course; if slow progress, it may be worth considering CIR)    Equipment Recommendations  Rolling walker with 5" wheels;3in1 (PT)    Recommendations for Other Services OT consult;Other (comment) (Vestibular PT)     Precautions / Restrictions Precautions Precautions: Fall      Mobility  Bed Mobility Overal bed mobility: Needs Assistance Bed Mobility: Supine to Sit     Supine to sit: Supervision     General bed mobility comments: Slow moving and inefficient, used bed rails, but did not require physical assist  Transfers Overall transfer level: Needs assistance Equipment used: 1 person hand held assist;Rolling walker (2 wheeled) Transfers: Sit  to/from Stand Sit to Stand: Min guard         General transfer comment: Minguard assist for steadiness; cues for safety, hand placement, and to self-monitor for activity tolerance  Ambulation/Gait Ambulation/Gait assistance: Mod assist Ambulation Distance (Feet): 6 Feet Assistive device: 1 person hand held assist;Rolling walker (2 wheeled) Gait Pattern/deviations: Scissoring;Ataxic;Staggering left;Staggering right     General Gait Details: Very unsteady on his feet, clearly requiring UE support (employed handheld and use of RW); noted multiple losses of balance requiring heavy mod assist to steady and get center of mass back over feet; used gait belt for safety  Stairs            Wheelchair Mobility    Modified Rankin (Stroke Patients Only) Modified Rankin (Stroke Patients Only) Pre-Morbid Rankin Score: No symptoms Modified Rankin: Moderately severe disability     Balance Overall balance assessment: Needs assistance   Sitting balance-Leahy Scale: Fair       Standing balance-Leahy Scale: Zero                               Pertinent Vitals/Pain Pain Assessment: No/denies pain    Home Living Family/patient expects to be discharged to:: Private residence Living Arrangements: Spouse/significant other Available Help at Discharge: Family;Available 24 hours/day Type of Home: House Home Access: Stairs to enter Entrance Stairs-Rails: None Entrance Stairs-Number of Steps: 3 Home Layout: One level Home Equipment: Walker - 2 wheels;Other (comment) (after previous leg surgery)      Prior Function Level of Independence: Independent         Comments: Enjoys yardwork, gardening     Hand Dominance  Extremity/Trunk Assessment   Upper Extremity Assessment: Overall WFL for tasks assessed           Lower Extremity Assessment: Generalized weakness (non-focal weakness)      Cervical / Trunk Assessment: Normal  Communication    Communication: No difficulties  Cognition Arousal/Alertness: Awake/alert Behavior During Therapy: WFL for tasks assessed/performed Overall Cognitive Status: Within Functional Limits for tasks assessed                      General Comments General comments (skin integrity, edema, etc.): Reproduction of symptoms of nausea with brief gaze stabilization assessment; not smooth eye movements looking from one target to another; suspect Vestibular involvement, and given relatively quick onset of symptoms, it is worth considering head imaging to Travis [name] in/out CVA; Paged Dr. Waldron [name], who place orders for Vestibular assessment and imaging of head    Exercises        Assessment/Plan    PT Assessment Patient needs continued PT services  PT Diagnosis Difficulty walking;Other (comment) (Gait and balance dysfunction)   PT Problem List Decreased strength;Decreased activity tolerance;Decreased balance;Decreased mobility;Decreased coordination;Decreased knowledge of use of DME;Decreased safety awareness;Decreased knowledge of precautions  PT Treatment Interventions DME instruction;Gait training;Stair training;Functional mobility training;Therapeutic activities;Therapeutic exercise;Patient/family education;Neuromuscular re-education   PT Goals (Current goals can be found in the Care Plan section) Acute Rehab PT Goals Patient Stated Goal: wants nausea to end PT Goal Formulation: With patient Time For Goal Achievement: 07/20/15 Potential to Achieve Goals: Good    Frequency Min 4X/week   Barriers to discharge        Co-evaluation               End of Session Equipment Utilized During Treatment: Gait belt Activity Tolerance: Other (comment) (increased nausea with moving) Patient left: in chair;with call bell/phone within reach;with chair alarm set;with nursing/sitter in room Nurse Communication: Mobility status         Time: 1407-1430 PT Time Calculation (min) (ACUTE ONLY): 23  min   Charges:   PT Evaluation $Initial PT Evaluation Tier I: 1 Procedure PT Treatments $Gait Training: 8-22 mins   PT G CodesQuin [name] 07/06/2015, 3:21 PM  Travis [name], [name] Pager [phone] Office [phone]

## 2015-07-07 ENCOUNTER — Inpatient Hospital Stay (HOSPITAL_COMMUNITY): Payer: Medicare Other

## 2015-07-07 LAB — HEMOGLOBIN A1C
Hgb A1c MFr Bld: 6.8 % — ABNORMAL HIGH (ref 4.8–5.6)
Mean Plasma Glucose: 148 mg/dL

## 2015-07-07 LAB — CBC
HCT: 38.8 % — ABNORMAL LOW (ref 39.0–52.0)
Hemoglobin: 12.4 g/dL — ABNORMAL LOW (ref 13.0–17.0)
MCH: 29.5 pg (ref 26.0–34.0)
MCHC: 32 g/dL (ref 30.0–36.0)
MCV: 92.2 fL (ref 78.0–100.0)
PLATELETS: 193 10*3/uL (ref 150–400)
RBC: 4.21 MIL/uL — AB (ref 4.22–5.81)
RDW: 13.5 % (ref 11.5–15.5)
WBC: 8 10*3/uL (ref 4.0–10.5)

## 2015-07-07 LAB — BASIC METABOLIC PANEL
Anion gap: 5 (ref 5–15)
BUN: 11 mg/dL (ref 6–20)
CO2: 28 mmol/L (ref 22–32)
CREATININE: 1.17 mg/dL (ref 0.61–1.24)
Calcium: 8.7 mg/dL — ABNORMAL LOW (ref 8.9–10.3)
Chloride: 104 mmol/L (ref 101–111)
GFR, EST NON AFRICAN AMERICAN: 56 mL/min — AB (ref >60)
Glucose, Bld: 120 mg/dL — ABNORMAL HIGH (ref 65–99)
POTASSIUM: 3.8 mmol/L (ref 3.5–5.1)
SODIUM: 137 mmol/L (ref 135–145)

## 2015-07-07 LAB — GLUCOSE, CAPILLARY
GLUCOSE-CAPILLARY: 106 mg/dL — AB (ref 65–99)
GLUCOSE-CAPILLARY: 110 mg/dL — AB (ref 65–99)
GLUCOSE-CAPILLARY: 121 mg/dL — AB (ref 65–99)
Glucose-Capillary: 121 mg/dL — ABNORMAL HIGH (ref 65–99)
Glucose-Capillary: 130 mg/dL — ABNORMAL HIGH (ref 65–99)

## 2015-07-07 MED ORDER — TERAZOSIN HCL 5 MG PO CAPS
10.0000 mg | ORAL_CAPSULE | Freq: Every day | ORAL | Status: DC
  Administered 2015-07-07 – 2015-07-10 (×4): 10 mg via ORAL
  Filled 2015-07-07 (×5): qty 2

## 2015-07-07 MED ORDER — LORAZEPAM 2 MG/ML IJ SOLN
1.0000 mg | Freq: Once | INTRAMUSCULAR | Status: DC | PRN
  Administered 2015-07-07: 1 mg via INTRAVENOUS
  Filled 2015-07-07: qty 1

## 2015-07-07 MED ORDER — FINASTERIDE 5 MG PO TABS
5.0000 mg | ORAL_TABLET | Freq: Every day | ORAL | Status: DC
  Administered 2015-07-07 – 2015-07-11 (×5): 5 mg via ORAL
  Filled 2015-07-07 (×5): qty 1

## 2015-07-07 NOTE — Evaluation (Signed)
Occupational Therapy Evaluation Patient Details Name: Travis [name]. MRN: [id number] DOB: 1930-02-08 Today's Date: 07/07/2015    History of Present Illness Travis [name]. is a 81 y.o. male with PMH of prediabetes, hypertension, arthritis, elevated PSA, who presents with nausea, vomiting. Reports nausea typically occurs with moving/changing positions, increased symptoms of nausea with brief gaze stabilization assessment   Clinical Impression   PT admitted with nausea / dizziness with CT revealing stroke. PT with pending MRI workup underway. Pt currently with functional limitiations due to the deficits listed below (see OT problem list). PTA living at home with wife independently per patient. Pt reports previous event with dizziness and doctor providing medication that made the dizziness stop. ( meclizine suggested and pt states Yeah that's the one) Pt will benefit from skilled OT to increase their independence and safety with adls and balance to allow discharge HHOT due to decline snf.PT is very high fall risk at this time.     Follow Up Recommendations  Home health OT;Other (comment) (pt declines SNF but could benefit)    Equipment Recommendations  Other (comment) (rw)    Recommendations for Other Services       Precautions / Restrictions Precautions Precautions: Fall Restrictions Weight Bearing Restrictions: No      Mobility Bed Mobility Overal bed mobility: Needs Assistance Bed Mobility: Sit to Supine     Supine to sit: Min guard     General bed mobility comments: pt using gravity to assist with an uncontrolled descend to bed surface  Transfers Overall transfer level: Needs assistance Equipment used: Rolling walker (2 wheeled) Transfers: Sit to/from Stand Sit to Stand: Mod assist         General transfer comment: abandoning RW with balance deficits noted. pt reaching for environmental supports that are unsafe such as the wheeled bedside table.     Balance Overall balance assessment: Needs assistance Sitting-balance support: Feet supported Sitting balance-Leahy Scale: Fair       Standing balance-Leahy Scale: Zero                              ADL Overall ADL's : Needs assistance/impaired Eating/Feeding: Set up;Bed level   Grooming: Wash/dry face;Minimal assistance;Sitting Grooming Details (indicate cue type and reason): pt finishing shaving on arrival.                  Toilet Transfer: Min guard;Ambulation;RW Toilet Transfer Details (indicate cue type and reason): pt requires heavy use of bil UE         Functional mobility during ADLs: Moderate assistance;Rolling walker General ADL Comments: Pt releasing RW abandoning RW and walking into IV lines with max v/c from therapist for safety. Pt continued to progress despite unsafe environment. Pt very upset about increase time required for staff to arrive and assist with toileting and the need to call for help. Pt      Vision     Perception     Praxis      Pertinent Vitals/Pain Pain Assessment: 0-10 Pain Score: 3  Pain Location: lower abdomen Pain Descriptors / Indicators: Discomfort Pain Intervention(s): Repositioned;Monitored during session     Hand Dominance Right   Extremity/Trunk Assessment Upper Extremity Assessment Upper Extremity Assessment: Overall WFL for tasks assessed   Lower Extremity Assessment Lower Extremity Assessment: Generalized weakness   Cervical / Trunk Assessment Cervical / Trunk Assessment: Normal   Communication Communication Communication: No difficulties   Cognition Arousal/Alertness:  Awake/alert Behavior During Therapy: WFL for tasks assessed/performed Overall Cognitive Status: Impaired/Different from baseline Area of Impairment: Awareness           Awareness: Emergent   General Comments: pt reports "i havent had a stroke they just want to run all these test. " daughter discussing MRI needs with patient  and pt states "if they wanted an MRI then they should have done it first not last"   General Comments       Exercises       Shoulder Instructions      Home Living Family/patient expects to be discharged to:: Private residence Living Arrangements: Spouse/significant other Available Help at Discharge: Family;Available 24 hours/day Type of Home: House Home Access: Stairs to enter [employer] of Steps: 2 Entrance Stairs-Rails: None Home Layout: One level     Bathroom Shower/Tub: Teacher, early years/pre: Standard     Home Equipment: Environmental consultant - 2 wheels;Other (comment)   Additional Comments: drives      Prior Functioning/Environment Level of Independence: Independent        Comments: Enjoys yardwork, gardening    OT Diagnosis: Generalized weakness;Cognitive deficits   OT Problem List: Decreased strength;Decreased activity tolerance;Impaired balance (sitting and/or standing);Decreased safety awareness;Decreased knowledge of use of DME or AE;Decreased knowledge of precautions;Decreased cognition;Decreased coordination;Pain   OT Treatment/Interventions: Self-care/ADL training;Therapeutic exercise;DME and/or AE instruction;Therapeutic activities;Cognitive remediation/compensation;Visual/perceptual remediation/compensation;Patient/family education;Balance training    OT Goals(Current goals can be found in the care plan section) Acute Rehab OT Goals Patient Stated Goal: wants nausea to end OT Goal Formulation: With patient Time For Goal Achievement: 07/21/15 Potential to Achieve Goals: Good  OT Frequency: Min 2X/week   Barriers to D/C:            Co-evaluation              End of Session Equipment Utilized During Treatment: Rolling walker Nurse Communication: Mobility status;Precautions  Activity Tolerance: Other (comment) (LIMITED by nausea at this time) Patient left: in bed;with call bell/phone within reach;with chair alarm set   Time:  0800-0821 OT Time Calculation (min): 21 min Charges:  OT General Charges $OT Visit: 1 Procedure OT Evaluation $Initial OT Evaluation Tier I: 1 Procedure G-Codes:    Parke Poisson B July 08, 2015, 9:34 AM  Pager: [phone]

## 2015-07-07 NOTE — Progress Notes (Signed)
Patient had a sinus pause of 2.33 seconds. Patient asymptomatic. NP on call notified.

## 2015-07-07 NOTE — Progress Notes (Signed)
Patient Demographics  Travis [name], is a 81 y.o. male, DOB - Nov 25, 1929, PVX:[id number]  Admit date - 07/05/2015   Admitting Physician Ivor Costa, MD  Outpatient Primary MD for the patient is Leonard Downing, MD  LOS - 2   Chief Complaint  Patient presents with  . Nausea  . Emesis         Subjective:   Travis [name] today has, No headache, No chest pain, No abdominal pain -  No Cough - SOB. Patient denies any nausea or vomiting since admission, ports couple episodes of dizziness.  Assessment & Plan    Principal Problem:   Nausea & vomiting Active Problems:   Hypertension   Arthritis   Pre-diabetes   Rash   Sepsis  Nausea and vomiting - This is most likely related to gastroenteritis, CT abdomen and pelvis with no evidence of SBO, but showed mild intrahepatic biliary ductal dilatation, LFT within normal limit, right upper quadrant ultrasound with no acute abnormalities. - Most likely related to viral gastroenteritis, no further episodes of nausea or vomiting. - Possibility of vertigo contributing to his symptoms.  Vertigo/dizziness - Possible dehydration from nausea and vomiting, as well vertigo contributing as well - CT head with evidence of age-indeterminate infarct, given concern of his symptoms may be related to acute CVA, MRI brain was ordered ,patient refused MRI brain yesterday, discussed with him today, he is agreeable to it.  Hypertension - Continue to hold atenolol and hydrochlorothiazide - Started on Norvasc and when necessary hydralazine  Prediabetes - CBGs are acceptable - Follow on hemoglobin A1c   Code Status: Full  Family Communication: None at bedside  Disposition Plan: Pending further workup.   Procedures  None   Consults   None   Medications  Scheduled Meds: . amLODipine  5 mg Oral Daily  . aspirin  162 mg Oral Daily  . finasteride  5 mg  Oral Daily  . heparin  5,000 Units Subcutaneous 3 times per day  . insulin aspart  0-9 Units Subcutaneous TID WC  . sodium chloride  3 mL Intravenous Q12H  . terazosin  10 mg Oral QHS   Continuous Infusions: . sodium chloride 100 mL/hr at 07/06/15 2204   PRN Meds:.acetaminophen **OR** acetaminophen, hydrALAZINE, hydrOXYzine, iohexol, LORazepam, meclizine, morphine injection, ondansetron (ZOFRAN) IV  DVT Prophylaxis  - Heparin   Lab Results  Component Value Date   PLT 193 07/07/2015    Antibiotics    Anti-infectives    None          Objective:   Filed Vitals:   07/06/15 1702 07/06/15 2117 07/07/15 0509 07/07/15 0837  BP: 126/66 143/68 154/68 125/67  Pulse: 59 61 57 105  Temp: 98.9 F (37.2 C) 98.3 F (36.8 C) 98.5 F (36.9 C) 98.1 F (36.7 C)  TempSrc: Oral Oral Oral Oral  Resp: 18 18 18 18   Height:  5\' 9"  (1.753 m)    Weight:  94.55 kg (208 lb 7.1 oz)    SpO2: 98% 96% 97% 96%    Wt Readings from Last 3 Encounters:  07/06/15 94.55 kg (208 lb 7.1 oz)  10/28/11 96.616 kg (213 lb)  10/04/11 96.163 kg (212 lb)     Intake/Output Summary (Last 24 hours) at  07/07/15 1310 Last data filed at 07/07/15 1023  Gross per 24 hour  Intake 3200.01 ml  Output   1025 ml  Net 2175.01 ml     Physical Exam  Awake Alert, Oriented Supple Neck,No JVD, No cervical lymphadenopathy appriciated.  Symmetrical Chest wall movement, Good air movement bilaterally,  RRR,No Gallops,Rubs or new Murmurs, No Parasternal Heave +ve B.Sounds, Abd Soft, No tenderness, No organomegaly appriciated, No Cyanosis, Clubbing or edema, No new Rash or bruise     Data Review   Micro Results Recent Results (from the past 240 hour(s))  Culture, blood (x 2)     Status: None (Preliminary result)   Collection Time: 07/05/15  8:26 PM  Result Value Ref Range Status   Specimen Description BLOOD RIGHT ARM  Final   Special Requests   Final    BOTTLES DRAWN AEROBIC AND ANAEROBIC 5ML AEB 1ML ANA    Culture NO GROWTH < 24 HOURS  Final   Report Status PENDING  Incomplete  Culture, blood (x 2)     Status: None (Preliminary result)   Collection Time: 07/05/15  8:30 PM  Result Value Ref Range Status   Specimen Description BLOOD LEFT ARM  Final   Special Requests   Final    BOTTLES DRAWN AEROBIC AND ANAEROBIC 5ML AEB 1ML ANA   Culture NO GROWTH < 24 HOURS  Final   Report Status PENDING  Incomplete    Radiology Reports Dg Chest 2 View  07/05/2015   CLINICAL DATA:  Nausea vomiting for 9 hours. Occasional cough. No chest pain. Coarse basilar breast sounds.  EXAM: CHEST  2 VIEW  COMPARISON:  11/23/2010  FINDINGS: Cardiac silhouette is normal in size and configuration. Aorta is mildly tortuous. No mediastinal or hilar masses or evidence of adenopathy.  Mild lung base opacity is noted, similar to the prior chest radiographs, consistent with atelectasis. No convincing pneumonia no pulmonary edema. No pleural effusion or pneumothorax.  Bony thorax is demineralized but grossly intact.  IMPRESSION: 1. No acute cardiopulmonary disease. 2. Basilar atelectasis similar to the prior chest radiograph.   Electronically Signed   By: Lajean Manes M.D.   On: 07/05/2015 18:29   Ct Head Wo Contrast  07/06/2015   CLINICAL DATA:  Vertigo  EXAM: CT HEAD WITHOUT CONTRAST  TECHNIQUE: Contiguous axial images were obtained from the base of the skull through the vertex without intravenous contrast.  COMPARISON:  None.  FINDINGS: No skull fracture is noted. No intracranial hemorrhage or midline shift. There is large area of decreased attenuation in the left cerebellum highly suspicious for ischemic of indeterminate age. Clinical correlation is necessary. Further correlation with MRI with diffusion imaging is recommended. Mild cerebral atrophy. Periventricular patchy subcortical white matter decreased attenuation probable due to chronic small vessel ischemic changes.  IMPRESSION: 1. There is area of decreased attenuation in left  cerebellum highly suspicious for infarcts of indeterminate age. Subacute infarct cannot be excluded. Clinical correlation is necessary. Further correlation with MRI is recommended as clinically warranted. No intracranial hemorrhage or midline shift. Mild cerebral atrophy. Periventricular and patchy subcortical white matter decreased attenuation probable due to chronic small vessel ischemic changes.   Electronically Signed   By: Lahoma Crocker M.D.   On: 07/06/2015 15:51   US Abdomen Complete  07/06/2015   CLINICAL DATA:  Evaluate biliary dilatation seen on recent CT scan.  EXAM: ULTRASOUND ABDOMEN COMPLETE  COMPARISON:  CT scan 07/06/2015  FINDINGS: Gallbladder: Mobile echogenic foci in the gallbladder without acoustic shadowing.  Possible small gallstones. There are also areas of ring down artifact most consistent with adenomyomatosis. No pericholecystic fluid or sonographic Murphy sign to suggest acute cholecystitis.  Common bile duct: Diameter: 8.0 mm  Liver: Intrahepatic biliary dilatation not well demonstrated. No focal hepatic lesions. Normal echogenicity.  IVC: Normal caliber.  Pancreas: Sonographically unremarkable.  Spleen: Normal size and echogenicity without focal lesions.  Right Kidney: Length: 11.2 cm. Normal renal cortical thickness and echogenicity without focal lesions or hydronephrosis.  Left Kidney: Length: 12.2 cm. Normal renal cortical thickness and echogenicity without focal lesions or hydronephrosis.  Abdominal aorta: Normal caliber.  Other findings: No ascites.  IMPRESSION: 1. Probable small gallstones and changes of adenomyomatosis. No findings for acute cholecystitis. 2. Normal caliber common bile duct for age. No common bile duct stones. 3. Unremarkable sonographic appearance of the liver, pancreas, spleen and both kidneys.   Electronically Signed   By: Marijo Sanes M.D.   On: 07/06/2015 10:38   Ct Abdomen Pelvis W Contrast  07/06/2015   CLINICAL DATA:  New onset nausea and vomiting.  Diffuse abdominal tenderness. Morson out bowel obstruction.  EXAM: CT ABDOMEN AND PELVIS WITH CONTRAST  TECHNIQUE: Multidetector CT imaging of the abdomen and pelvis was performed using the standard protocol following bolus administration of intravenous contrast.  CONTRAST:  124mL OMNIPAQUE IOHEXOL 300 MG/ML  SOLN  COMPARISON:  01/07/2012  FINDINGS: Lower chest: Volume loss in both lower lobes with linear atelectasis.  Liver: Mild intrahepatic biliary ductal dilatation. Tiny 6 mm hypodensity in the subcapsular right hepatic lobe, too small to characterize.  Hepatobiliary: Prominent common bile duct measuring 9 mm at the porta hepatis. Gallbladder is physiologically distended. Small layering stones or sludge.  Pancreas: No pancreatic ductal dilatation or surrounding inflammation. Mildly atrophic.  Spleen: Normal in size without focal lesion. Trace perisplenic fluid.  Adrenal glands: No nodule.  Kidneys: Symmetric renal enhancement. No hydronephrosis. Small bilateral cortical cysts.  Stomach/Bowel: Small hiatal hernia. Stomach physiologically distended. There are no dilated or thickened small bowel loops. Small-moderate volume of stool throughout the colon without colonic wall thickening. Cecum is high-riding in the mid abdomen. The appendix is normal.  Vascular/Lymphatic: No retroperitoneal adenopathy. Abdominal aorta is normal in caliber. Atherosclerosis of the abdominal aorta without aneurysm.  Reproductive: Marked enlargement of the prostate gland, measuring at least 9.4 x 8.4 cm in by axial dimension. This causes mass effect on the bladder base.  Bladder: Distended.  No wall thickening.  Other: No free air, free fluid, or intra-abdominal fluid collection. Fat containing bilateral inguinal hernias, left larger than right. No pelvic adenopathy.  Musculoskeletal: There are no acute or suspicious osseous abnormalities.  IMPRESSION: 1. No bowel obstruction.  No signs of bowel inflammation. 2. Mild intrahepatic biliary  ductal dilatation, this is new from prior exam. Common bile duct appears prominent measuring 9 mm. No abnormal gallbladder distention. Significance is uncertain. If patient is able tolerate breath hold technique in there is clinical concern for hepatobiliary pathology, an MRCP could be considered. 3. Marked enlargement of the prostate gland, similar to prior exam.   Electronically Signed   By: Jeb Levering M.D.   On: 07/06/2015 01:12     CBC  Recent Labs Lab 07/05/15 1654 07/06/15 0539 07/07/15 0503  WBC 11.1* 8.3 8.0  HGB 13.8 12.4* 12.4*  HCT 41.6 37.6* 38.8*  PLT 195 213 193  MCV 90.4 90.8 92.2  MCH 30.0 30.0 29.5  MCHC 33.2 33.0 32.0  RDW 13.3 13.4 13.5  LYMPHSABS 0.4*  --   --  MONOABS 0.4  --   --   EOSABS 0.0  --   --   BASOSABS 0.0  --   --     Chemistries   Recent Labs Lab 07/05/15 1654 07/06/15 0539 07/07/15 0503  NA 134* 140 137  K 3.8 4.1 3.8  CL 99* 107 104  CO2 24 26 28   GLUCOSE 219* 125* 120*  BUN 17 11 11   CREATININE 1.16 1.08 1.17  CALCIUM 9.1 8.7* 8.7*  AST 27  --   --   ALT 34  --   --   ALKPHOS 89  --   --   BILITOT 0.5  --   --    ------------------------------------------------------------------------------------------------------------------ estimated creatinine clearance is 54.3 mL/min (by C-G formula based on Cr of 1.17). ------------------------------------------------------------------------------------------------------------------ No results for input(s): HGBA1C in the last 72 hours. ------------------------------------------------------------------------------------------------------------------ No results for input(s): CHOL, HDL, LDLCALC, TRIG, CHOLHDL, LDLDIRECT in the last 72 hours. ------------------------------------------------------------------------------------------------------------------ No results for input(s): TSH, T4TOTAL, T3FREE, THYROIDAB in the last 72 hours.  Invalid input(s):  FREET3 ------------------------------------------------------------------------------------------------------------------ No results for input(s): VITAMINB12, FOLATE, FERRITIN, TIBC, IRON, RETICCTPCT in the last 72 hours.  Coagulation profile  Recent Labs Lab 07/05/15 2026  INR 0.98    No results for input(s): DDIMER in the last 72 hours.  Cardiac Enzymes  Recent Labs Lab 07/05/15 2026 07/06/15 0124 07/06/15 0700  TROPONINI <0.03 <0.03 0.03   ------------------------------------------------------------------------------------------------------------------ Invalid input(s): POCBNP     Time Spent in minutes   30 minutes   ELGERGAWY, DAWOOD M.D on 07/07/2015 at 1:10 PM  Between 7am to 7pm - Pager - [phone]  After 7pm go to [email or web address] - password [facility]  [facility]   Office  [phone]

## 2015-07-07 NOTE — Progress Notes (Signed)
Physical Therapy Treatment Patient Details Name: Travis [name]. MRN: [id number] DOB: 12-08-1929 Today's Date: 07/07/2015    History of Present Illness Travis [name]. is a 81 y.o. male with PMH of prediabetes, hypertension, arthritis, elevated PSA, who presents with nausea, vomiting. Reports nausea typically occurs with moving/changing positions, increased symptoms of nausea with brief gaze stabilization assessment    PT Comments    Pt admitted with above diagnosis. Pt currently with functional limitations due to endurance and balance deficits as well as vertigo.  Pt able to ambulate with RW with min guard to min assist.  Not as dizzy after treatment.  Pt feels that he will be ok to go home and use RW.  F/u HHPT recommended.   Pt will benefit from skilled PT to increase their independence and safety with mobility to allow discharge to the venue listed below.    Follow Up Recommendations  Home health PT;Supervision/Assistance - 24 hour (for vestibular rehab)     Equipment Recommendations  3in1 (PT);Other (comment) (if pt does not have RW please order (he thinks he does have one))    Recommendations for Other Services       Precautions / Restrictions Precautions Precautions: Fall Restrictions Weight Bearing Restrictions: No    Mobility  Bed Mobility Overal bed mobility: Needs Assistance Bed Mobility: Supine to Sit;Sit to Supine     Supine to sit: Min guard;Min assist     General bed mobility comments: pt needed a little assist to push up from bed from side.  pt using gravity to assist with an uncontrolled descend to bed surface  Transfers Overall transfer level: Needs assistance Equipment used: Rolling walker (2 wheeled) Transfers: Sit to/from Stand Sit to Stand: Min assist         General transfer comment: Pt used RW well for sit to stand with proper hand placment.    Ambulation/Gait Ambulation/Gait assistance: Min assist Ambulation Distance (Feet): 110  Feet Assistive device: Rolling walker (2 wheeled) Gait Pattern/deviations: Drifts right/left;Wide base of support;Decreased stride length;Step-through pattern   Gait velocity interpretation: Below normal speed for age/gender General Gait Details: Pt ambulating safely with RW with good technique in controlled environment.  Struggled with RW when he got back to room as he could not process how to maneuver RW in enclosed spaces.  Needed assist and cues to maneuver in challenging environment.     Stairs            Wheelchair Mobility    Modified Rankin (Stroke Patients Only) Modified Rankin (Stroke Patients Only) Pre-Morbid Rankin Score: No symptoms Modified Rankin: Moderately severe disability     Balance Overall balance assessment: Needs assistance Sitting-balance support: No upper extremity supported;Feet supported Sitting balance-Leahy Scale: Fair     Standing balance support: Bilateral upper extremity supported;During functional activity Standing balance-Leahy Scale: Poor Standing balance comment: Pt requires UE support of RW.              High level balance activites: Direction changes;Turns;Side stepping High Level Balance Comments: Needed min guard assist and cues for maneuvering in uncontrolled environment.      Cognition Arousal/Alertness: Awake/alert Behavior During Therapy: WFL for tasks assessed/performed Overall Cognitive Status: Impaired/Different from baseline Area of Impairment: Awareness           Awareness: Emergent        Exercises      General Comments General comments (skin integrity, edema, etc.): Tested for BPPV all canals with positive test for right BPPV.  Treated via canalith repositioining maneuver.  Reports Dizzy at 5/10 initially and 3/10 after treatment.  Pt reports his gait and transitions are better after treatment.       Pertinent Vitals/Pain Pain Assessment: No/denies pain  VSS    Home Living                       Prior Function            PT Goals (current goals can now be found in the care plan section) Progress towards PT goals: Progressing toward goals    Frequency  Min 4X/week    PT Plan Current plan remains appropriate    Co-evaluation             End of Session Equipment Utilized During Treatment: Gait belt Activity Tolerance: Patient limited by fatigue Patient left: in chair;with call bell/phone within reach;with chair alarm set     Time: [phone] PT Time Calculation (min) (ACUTE ONLY): 27 min  Charges:  $Gait Training: 8-22 mins $Canalith Rep Proc: 8-22 mins                    G Codes:      Irwin Brakeman F 28-Jul-2015, 1:17 PM [facility] [facility] [phone] (pager)

## 2015-07-07 NOTE — Progress Notes (Signed)
OT NOTE  Daughter: Jinger Neighbors  [phone] (text message welcome too) [phone] ext [phone]  Daughter present at time of OT evaluation and reports to call her if staff need anything. Pt at baseline is very direct with wants / dislikes. Family spoke with patient concerning MRI and encouraging patient to participate.  Patients stay could be greatly improved with toilet program and frequent checks by staff. Pt reports needs to void with urgency.   Jeri Modena   OTR/L Pager: [phone] Office: [phone] .

## 2015-07-08 ENCOUNTER — Inpatient Hospital Stay (HOSPITAL_COMMUNITY): Payer: Medicare Other

## 2015-07-08 DIAGNOSIS — I6789 Other cerebrovascular disease: Secondary | ICD-10-CM

## 2015-07-08 DIAGNOSIS — R42 Dizziness and giddiness: Secondary | ICD-10-CM

## 2015-07-08 DIAGNOSIS — I6330 Cerebral infarction due to thrombosis of unspecified cerebral artery: Secondary | ICD-10-CM | POA: Insufficient documentation

## 2015-07-08 LAB — GLUCOSE, CAPILLARY
GLUCOSE-CAPILLARY: 115 mg/dL — AB (ref 65–99)
Glucose-Capillary: 103 mg/dL — ABNORMAL HIGH (ref 65–99)
Glucose-Capillary: 95 mg/dL (ref 65–99)
Glucose-Capillary: 125 mg/dL — ABNORMAL HIGH (ref 65–99)

## 2015-07-08 MED ORDER — CLOPIDOGREL BISULFATE 75 MG PO TABS
75.0000 mg | ORAL_TABLET | Freq: Every day | ORAL | Status: DC
  Administered 2015-07-08 – 2015-07-11 (×4): 75 mg via ORAL
  Filled 2015-07-08 (×4): qty 1

## 2015-07-08 NOTE — Progress Notes (Signed)
Occupational Therapy Treatment Patient Details Name: Travis [name]. MRN: [id number] DOB: 14-Nov-1929 Today's Date: 07/08/2015    History of present illness  81 y.o. male with PMH of prediabetes, hypertension, arthritis, elevated PSA, who presents with nausea, vomiting. Reports nausea typically occurs with moving/changing positions, increased symptoms of nausea with brief gaze stabilization assessment. MRI (+) Large acute left cerebellar infarct involving majority of the mid to inferior aspect of the left cerebellum extending into the vermis and with minimal involvement posterior right cerebellum. Mass effect with narrowing of the fourth ventricle and fourth ventricular outflow placing the patient at risk for development of hydrocephalus   OT comments  Pt remains with balance deficits and lack of awareness to deficits. Pt insist on d/c home. Pt motivated this session to participate in personal hygiene with coffee. Pt with incontinence aware and no effort to resolve issue. Pt states "just let it dry" Pt reports at baseline incontinence and that there is no use in wearing those depends they just get wet like my clothes anyway.    Follow Up Recommendations  SNF (pt refusing SNF)    Equipment Recommendations  Other (comment) (RW)    Recommendations for Other Services      Precautions / Restrictions Precautions Precautions: Fall Restrictions Weight Bearing Restrictions: No       Mobility Bed Mobility Overal bed mobility: Needs Assistance Bed Mobility: Supine to Sit     Supine to sit: Min assist     General bed mobility comments: use of bed rail and incr time. Pt attempting to reach and pull on OT  Transfers Overall transfer level: Needs assistance Equipment used: Rolling walker (2 wheeled) Transfers: Sit to/from Stand Sit to Stand: Min assist         General transfer comment: cues for RW safety    Balance Overall balance assessment: Needs assistance Sitting-balance  support: No upper extremity supported;Feet supported Sitting balance-Leahy Scale: Fair     Standing balance support: During functional activity;Bilateral upper extremity supported Standing balance-Leahy Scale: Poor                     ADL Overall ADL's : Needs assistance/impaired             Lower Body Bathing: Moderate assistance;Sit to/from stand Lower Body Bathing Details (indicate cue type and reason): requires (A) for posterior aspect of peri care. pt reports that incontinence is not new and that he just changes frequently throughout the day with wife (A). Ot inquired about  Upper Body Dressing : Minimal assistance;Sitting   Lower Body Dressing: Maximal assistance;Sit to/from stand   Toilet Transfer: Minimal assistance;Ambulation;Stand-pivot Toilet Transfer Details (indicate cue type and reason): impulsive attempting to sit on IV and no awareness to IV placement           General ADL Comments: Pt with spillage of liquid on the floor . pt reports its jello. Pt with a urinal near by and small amount remained. Pt after much discussion admitts to urine on floor and once covers removed urine on boxers / bed also. Pt agreeable to LB dress/ bathing once offered coffee      Vision                     Perception     Praxis      Cognition   Behavior During Therapy: Impulsive Overall Cognitive Status: Impaired/Different from baseline Area of Impairment: Awareness;Safety/judgement  Safety/Judgement: Decreased awareness of deficits;Decreased awareness of safety Awareness: Emergent   General Comments: pt reports "they say I had a stroke" pt reports "i can't tell no difference" when discussing exercises with PT. Pt states "I lie alot . tell people what they want to hear so I can go home. I had a stroke and i could be having a stroke at home. they arent doing anything here"    Extremity/Trunk Assessment               Exercises     Shoulder  Instructions       General Comments      Pertinent Vitals/ Pain       Pain Assessment: Faces Faces Pain Scale: Hurts little more Pain Intervention(s): Repositioned  Home Living                                          Prior Functioning/Environment              Frequency Min 2X/week     Progress Toward Goals  OT Goals(current goals can now be found in the care plan section)  Progress towards OT goals: Progressing toward goals  Acute Rehab OT Goals Patient Stated Goal: nausea at the end of the session and reports "i am ready to go home" OT Goal Formulation: With patient Time For Goal Achievement: 07/21/15 Potential to Achieve Goals: Good ADL Goals Pt Will Perform Grooming: with supervision;standing Pt Will Perform Upper Body Bathing: with min guard assist;sitting Pt Will Perform Lower Body Bathing: with min guard assist;sit to/from stand Pt Will Transfer to Toilet: with min guard assist;regular height toilet Pt Will Perform Tub/Shower Transfer: Tub transfer;with min guard assist;ambulating;rolling walker  Plan Discharge plan remains appropriate    Co-evaluation                 End of Session Equipment Utilized During Treatment: Rolling walker   Activity Tolerance Patient tolerated treatment well   Patient Left in chair;with call bell/phone within reach;with chair alarm set   Nurse Communication Mobility status;Precautions        Time: 1093-2355 OT Time Calculation (min): 39 min  Charges: OT General Charges $OT Visit: 1 Procedure OT Treatments $Self Care/Home Management : 38-52 mins  Parke Poisson B 07/08/2015, 11:08 AM  Jeri Modena   OTR/L Pager: [phone] Office: [phone] .

## 2015-07-08 NOTE — Progress Notes (Signed)
  Echocardiogram 2D Echocardiogram has been performed.  Yer Castello 07/08/2015, 1:27 PM

## 2015-07-08 NOTE — Care Management Important Message (Signed)
Important Message  Patient Details  Name: Travis [name]. MRN: [id number] Date of Birth: 1930/09/14   Medicare Important Message Given:  Yes-second notification given    Delorse Lek 07/08/2015, 11:29 AM

## 2015-07-08 NOTE — Progress Notes (Signed)
Physical Therapy Treatment Patient Details Name: Travis [name]. MRN: [id number] DOB: 1930/08/30 Today's Date: 07/08/2015    History of Present Illness  81 y.o. male with PMH of prediabetes, hypertension, arthritis, elevated PSA, who presents with nausea, vomiting. Reports nausea typically occurs with moving/changing positions, increased symptoms of nausea with brief gaze stabilization assessment. MRI (+) Large acute left cerebellar infarct involving majority of the mid to inferior aspect of the left cerebellum extending into the vermis and with minimal involvement posterior right cerebellum. Mass effect with narrowing of the fourth ventricle and fourth ventricular outflow placing the patient at risk for development of hydrocephalus    PT Comments    Patient demonstrating multiple signs of central vestibular dysfunction and no further signs of BPPV today.  Also could have bilateral vestibular hypofunction, but less likely given MRI results and acute onset of symptoms.  Patient will need 24 hour assist at d/c and safest for SNF level rehab, which he may refuse.  Feel education, though verbally acknowledged will not carry over due to impulsivity, decreased safety and decreased deficit awareness.    Follow Up Recommendations  SNF;Supervision/Assistance - 24 hour     Equipment Recommendations  Rolling walker with 5" wheels    Recommendations for Other Services       Precautions / Restrictions Precautions Precautions: Fall    Mobility  Bed Mobility Overal bed mobility: Needs Assistance Bed Mobility: Supine to Sit;Sit to Supine     Supine to sit: Min guard Sit to supine: Supervision   General bed mobility comments: sat up with elevated HOB, minguard for safety only due to impulsivity  Transfers Overall transfer level: Needs assistance Equipment used: Rolling walker (2 wheeled) Transfers: Sit to/from Stand Sit to Stand: Min guard         General transfer comment: assist for  safety with walker  Ambulation/Gait Ambulation/Gait assistance: Min assist Ambulation Distance (Feet): 200 Feet Assistive device: Rolling walker (2 wheeled) Gait Pattern/deviations: Step-through pattern;Wide base of support;Decreased stride length     General Gait Details: assist around all obstacles both in room and especially in bathroom pt picking up walker, moving it sideways and with balance all over the place when no UE support during functional activity   Stairs            Wheelchair Mobility    Modified Rankin (Stroke Patients Only) Modified Rankin (Stroke Patients Only) Pre-Morbid Rankin Score: No symptoms Modified Rankin: Moderately severe disability     Balance Overall balance assessment: Needs assistance         Standing balance support: Bilateral upper extremity supported Standing balance-Leahy Scale: Poor Standing balance comment: in bathroom needed min/mod assist when standing without UE support due to impulsivity, poor balance, decreased safety awareness and question visuospatial perceptual awareness deficits                    Cognition Arousal/Alertness: Awake/alert Behavior During Therapy: Impulsive Overall Cognitive Status: Impaired/Different from baseline Area of Impairment: Awareness;Safety/judgement;Attention         Safety/Judgement: Decreased awareness of deficits;Decreased awareness of safety Awareness: Emergent   General Comments: Patient cued throughout session on safety techniques and discussed at end of session problems with safety awareness and pt admitted to falling in the room yesterday, attempting to get up then falling back again and hitting head on the floor and reports he did not tell nursing.    Exercises      General Comments General comments (skin integrity, edema, etc.):  Patient reports better vertigo symptoms today.  Seated edge of bed testing oculomotor WNL, smooth pursuits revealed saccadic movements  horizontal and vertical, VOR intact, but bilateral refixation with head thrust testing.  Noted hearing deficit bilateral (pt reports is not new), and impaired VOR cancellation bilateral catch up saccades.  Patient placed in dix hallpike without symptoms or nystagmus initially head to right, then left.        Pertinent Vitals/Pain Pain Assessment: No/denies pain    Home Living                      Prior Function            PT Goals (current goals can now be found in the care plan section) Progress towards PT goals: Progressing toward goals    Frequency  Min 4X/week    PT Plan Discharge plan needs to be updated    Co-evaluation             End of Session Equipment Utilized During Treatment: Gait belt Activity Tolerance: Patient tolerated treatment well Patient left: in bed;with call bell/phone within reach     Time: 1436-1500 PT Time Calculation (min) (ACUTE ONLY): 24 min  Charges:  $Gait Training: 8-22 mins $Neuromuscular Re-education: 8-22 mins                    G Codes:      [name],[name] 2015-07-31, 3:56 PM  Magda Kiel, [place] 31-Jul-2015

## 2015-07-08 NOTE — Progress Notes (Signed)
PT Cancellation Note  Patient Details Name: Travis [name]. MRN: [id number] DOB: 1930/03/06   Cancelled Treatment:    Reason Eval/Treat Not Completed: Other (comment) (Refused adamantly.  "I am not getting up this am.")  Will reattempt as able.  Thanks.   Irwin Brakeman F 07/08/2015, 8:53 AM [facility] [phone] (pager)

## 2015-07-08 NOTE — Progress Notes (Signed)
Patient Demographics  Travis [name], is a 81 y.o. male, DOB - 1930/05/18, IHK:[id number]  Admit date - 07/05/2015   Admitting Physician Ivor Costa, MD  Outpatient Primary MD for the patient is Leonard Downing, MD  LOS - 3   Chief Complaint  Patient presents with  . Nausea  . Emesis         Subjective:   Travis [name] today has, No headache, No chest pain, No abdominal pain -  No Cough - SOB. Patient denies any nausea or vomiting since admission, still complains of vertigo.  Assessment & Plan    Principal Problem:   Nausea & vomiting Active Problems:   Hypertension   Arthritis   Pre-diabetes   Rash   Sepsis   Cerebral thrombosis with cerebral infarction  Acute CVA  -  MRI brain showing large left cerebellar acute infarct, with some mass effect on fourth ventricle but no evidence of hydrocephalus  - Continue with  Neuro Checks every 4 hours for next 48 hours given mass effects on fourth ventricle - Patient on aspirin at home, was changed to Plavix by neurology  - glycohemoglobin A1c is 6.8 - Carotid Dopplers :1-39% internal carotid artery stenosis bilaterally. The left vertebral artery is patent with antegrade flow. Unable to visualize the right vertebral artery. - 2-D echo with EF 55%, unremarkable - Follow on lipid panel  - Continue with telemetry monitoring - patient been seen by PT and OT   Nausea and vomiting - resolved , possibly related to his CVA versus gastroenteritis .  Vertigo - Initially thought to be secondary to BPV versus dehydration , but CT head with evidence of age-indeterminate infarct , so MRI brain was obtained and showing large left cerebellar acute infarct   Hypertension - Continue to hold atenolol and hydrochlorothiazide in the setting of dehydration , continue to hold atenolol giving his bradycardia . - started on Norvasc, will discontinue to allow for  permissive attention.  Prediabetes - CBGs are acceptable - hemoglobin A1c 6.8    Code Status: Full  Family Communication: None at bedside  Disposition Plan: Pending further workup.   Procedures  None   Consults   None   Medications  Scheduled Meds: . amLODipine  5 mg Oral Daily  . aspirin  162 mg Oral Daily  . finasteride  5 mg Oral Daily  . heparin  5,000 Units Subcutaneous 3 times per day  . insulin aspart  0-9 Units Subcutaneous TID WC  . sodium chloride  3 mL Intravenous Q12H  . terazosin  10 mg Oral QHS   Continuous Infusions: . sodium chloride 100 mL/hr at 07/06/15 2204   PRN Meds:.acetaminophen **OR** acetaminophen, hydrALAZINE, hydrOXYzine, iohexol, LORazepam, meclizine, morphine injection, ondansetron (ZOFRAN) IV  DVT Prophylaxis  - Heparin   Lab Results  Component Value Date   PLT 193 07/07/2015    Antibiotics    Anti-infectives    None          Objective:   Filed Vitals:   07/07/15 2026 07/08/15 0403 07/08/15 0816 07/08/15 1646  BP: 154/65 148/69 154/64 153/66  Pulse: 55 53 51 55  Temp: 98.3 F (36.8 C) 98.5 F (36.9 C) 98.5 F (36.9 C) 98.8 F (37.1 C)  TempSrc:  Oral Oral Oral Oral  Resp: 18 17 16 18   Height: 5\' 9"  (1.753 m)     Weight: 95.845 kg (211 lb 4.8 oz)     SpO2: 96% 93% 96% 98%    Wt Readings from Last 3 Encounters:  07/07/15 95.845 kg (211 lb 4.8 oz)  10/28/11 96.616 kg (213 lb)  10/04/11 96.163 kg (212 lb)     Intake/Output Summary (Last 24 hours) at 07/08/15 1702 Last data filed at 07/08/15 4098  Gross per 24 hour  Intake 2743.33 ml  Output    675 ml  Net 2068.33 ml     Physical Exam  Awake Alert, Oriented Supple Neck,No JVD, No cervical lymphadenopathy appriciated.  Symmetrical Chest wall movement, Good air movement bilaterally,  RRR,No Gallops,Rubs or new Murmurs, No Parasternal Heave +ve B.Sounds, Abd Soft, No tenderness, No organomegaly appriciated, No Cyanosis, Clubbing or edema, No new Rash  or bruise     Data Review   Micro Results Recent Results (from the past 240 hour(s))  Culture, blood (x 2)     Status: None (Preliminary result)   Collection Time: 07/05/15  8:26 PM  Result Value Ref Range Status   Specimen Description BLOOD RIGHT ARM  Final   Special Requests   Final    BOTTLES DRAWN AEROBIC AND ANAEROBIC 5ML AER 1ML ANA   Culture NO GROWTH 3 DAYS  Final   Report Status PENDING  Incomplete  Culture, blood (x 2)     Status: None (Preliminary result)   Collection Time: 07/05/15  8:30 PM  Result Value Ref Range Status   Specimen Description BLOOD LEFT ARM  Final   Special Requests   Final    BOTTLES DRAWN AEROBIC AND ANAEROBIC 5ML AER 1ML ANA   Culture NO GROWTH 3 DAYS  Final   Report Status PENDING  Incomplete    Radiology Reports Dg Chest 2 View  07/05/2015   CLINICAL DATA:  Nausea vomiting for 9 hours. Occasional cough. No chest pain. Coarse basilar breast sounds.  EXAM: CHEST  2 VIEW  COMPARISON:  11/23/2010  FINDINGS: Cardiac silhouette is normal in size and configuration. Aorta is mildly tortuous. No mediastinal or hilar masses or evidence of adenopathy.  Mild lung base opacity is noted, similar to the prior chest radiographs, consistent with atelectasis. No convincing pneumonia no pulmonary edema. No pleural effusion or pneumothorax.  Bony thorax is demineralized but grossly intact.  IMPRESSION: 1. No acute cardiopulmonary disease. 2. Basilar atelectasis similar to the prior chest radiograph.   Electronically Signed   By: Lajean Manes M.D.   On: 07/05/2015 18:29   Ct Head Wo Contrast  07/06/2015   CLINICAL DATA:  Vertigo  EXAM: CT HEAD WITHOUT CONTRAST  TECHNIQUE: Contiguous axial images were obtained from the base of the skull through the vertex without intravenous contrast.  COMPARISON:  None.  FINDINGS: No skull fracture is noted. No intracranial hemorrhage or midline shift. There is large area of decreased attenuation in the left cerebellum highly suspicious  for ischemic of indeterminate age. Clinical correlation is necessary. Further correlation with MRI with diffusion imaging is recommended. Mild cerebral atrophy. Periventricular patchy subcortical white matter decreased attenuation probable due to chronic small vessel ischemic changes.  IMPRESSION: 1. There is area of decreased attenuation in left cerebellum highly suspicious for infarcts of indeterminate age. Subacute infarct cannot be excluded. Clinical correlation is necessary. Further correlation with MRI is recommended as clinically warranted. No intracranial hemorrhage or midline shift. Mild cerebral atrophy. Periventricular  and patchy subcortical white matter decreased attenuation probable due to chronic small vessel ischemic changes.   Electronically Signed   By: Lahoma Crocker M.D.   On: 07/06/2015 15:51   Mr Brain Wo Contrast  07/07/2015   CLINICAL DATA:  81 year old hypertensive male with vertigo. Subsequent encounter.  EXAM: MRI HEAD WITHOUT CONTRAST  TECHNIQUE: Multiplanar, multiecho pulse sequences of the brain and surrounding structures were obtained without intravenous contrast.  COMPARISON:  07/06/2015 head CT.  FINDINGS: Exam is motion degraded.  Large acute left cerebellar infarct involving majority of the mid to inferior aspect of the left cerebellum extending into the vermis and with minimal involvement posterior right cerebellum. Mass effect with narrowing of the fourth ventricle and fourth ventricular outflow placing the patient at risk for development of hydrocephalus. Currently no hydrocephalus.  Prominent small vessel disease type changes.  Mild global atrophy.  No intracranial mass lesion noted on this unenhanced exam.  Limited evaluation of intracranial vasculature secondary to motion degradation. The vertebral arteries, basilar artery and internal carotid arteries appear to be patent.  Polypoid opacification inferior maxillary sinuses with mild mucosal thickening ethmoid sinus air cells.   Mild degenerative changes upper cervical spine. Cervical medullary junction, pituitary region pineal region unremarkable. Post right lens replacement without orbital abnormality identified.  IMPRESSION: Exam is motion degraded.  Large acute left cerebellar infarct involving majority of the mid to inferior aspect of the left cerebellum extending into the vermis and with minimal involvement posterior right cerebellum. Mass effect with narrowing of the fourth ventricle and fourth ventricular outflow placing the patient at risk for development of hydrocephalus. Currently no hydrocephalus. Close CT surveillance recommended.   Electronically Signed   By: Genia Del M.D.   On: 07/07/2015 14:01   US Abdomen Complete  07/06/2015   CLINICAL DATA:  Evaluate biliary dilatation seen on recent CT scan.  EXAM: ULTRASOUND ABDOMEN COMPLETE  COMPARISON:  CT scan 07/06/2015  FINDINGS: Gallbladder: Mobile echogenic foci in the gallbladder without acoustic shadowing. Possible small gallstones. There are also areas of ring down artifact most consistent with adenomyomatosis. No pericholecystic fluid or sonographic Murphy sign to suggest acute cholecystitis.  Common bile duct: Diameter: 8.0 mm  Liver: Intrahepatic biliary dilatation not well demonstrated. No focal hepatic lesions. Normal echogenicity.  IVC: Normal caliber.  Pancreas: Sonographically unremarkable.  Spleen: Normal size and echogenicity without focal lesions.  Right Kidney: Length: 11.2 cm. Normal renal cortical thickness and echogenicity without focal lesions or hydronephrosis.  Left Kidney: Length: 12.2 cm. Normal renal cortical thickness and echogenicity without focal lesions or hydronephrosis.  Abdominal aorta: Normal caliber.  Other findings: No ascites.  IMPRESSION: 1. Probable small gallstones and changes of adenomyomatosis. No findings for acute cholecystitis. 2. Normal caliber common bile duct for age. No common bile duct stones. 3. Unremarkable sonographic  appearance of the liver, pancreas, spleen and both kidneys.   Electronically Signed   By: Marijo Sanes M.D.   On: 07/06/2015 10:38   Ct Abdomen Pelvis W Contrast  07/06/2015   CLINICAL DATA:  New onset nausea and vomiting. Diffuse abdominal tenderness. Suthers out bowel obstruction.  EXAM: CT ABDOMEN AND PELVIS WITH CONTRAST  TECHNIQUE: Multidetector CT imaging of the abdomen and pelvis was performed using the standard protocol following bolus administration of intravenous contrast.  CONTRAST:  176mL OMNIPAQUE IOHEXOL 300 MG/ML  SOLN  COMPARISON:  01/07/2012  FINDINGS: Lower chest: Volume loss in both lower lobes with linear atelectasis.  Liver: Mild intrahepatic biliary ductal dilatation. Tiny 6  mm hypodensity in the subcapsular right hepatic lobe, too small to characterize.  Hepatobiliary: Prominent common bile duct measuring 9 mm at the porta hepatis. Gallbladder is physiologically distended. Small layering stones or sludge.  Pancreas: No pancreatic ductal dilatation or surrounding inflammation. Mildly atrophic.  Spleen: Normal in size without focal lesion. Trace perisplenic fluid.  Adrenal glands: No nodule.  Kidneys: Symmetric renal enhancement. No hydronephrosis. Small bilateral cortical cysts.  Stomach/Bowel: Small hiatal hernia. Stomach physiologically distended. There are no dilated or thickened small bowel loops. Small-moderate volume of stool throughout the colon without colonic wall thickening. Cecum is high-riding in the mid abdomen. The appendix is normal.  Vascular/Lymphatic: No retroperitoneal adenopathy. Abdominal aorta is normal in caliber. Atherosclerosis of the abdominal aorta without aneurysm.  Reproductive: Marked enlargement of the prostate gland, measuring at least 9.4 x 8.4 cm in by axial dimension. This causes mass effect on the bladder base.  Bladder: Distended.  No wall thickening.  Other: No free air, free fluid, or intra-abdominal fluid collection. Fat containing bilateral inguinal  hernias, left larger than right. No pelvic adenopathy.  Musculoskeletal: There are no acute or suspicious osseous abnormalities.  IMPRESSION: 1. No bowel obstruction.  No signs of bowel inflammation. 2. Mild intrahepatic biliary ductal dilatation, this is new from prior exam. Common bile duct appears prominent measuring 9 mm. No abnormal gallbladder distention. Significance is uncertain. If patient is able tolerate breath hold technique in there is clinical concern for hepatobiliary pathology, an MRCP could be considered. 3. Marked enlargement of the prostate gland, similar to prior exam.   Electronically Signed   By: Jeb Levering M.D.   On: 07/06/2015 01:12     CBC  Recent Labs Lab 07/05/15 1654 07/06/15 0539 07/07/15 0503  WBC 11.1* 8.3 8.0  HGB 13.8 12.4* 12.4*  HCT 41.6 37.6* 38.8*  PLT 195 213 193  MCV 90.4 90.8 92.2  MCH 30.0 30.0 29.5  MCHC 33.2 33.0 32.0  RDW 13.3 13.4 13.5  LYMPHSABS 0.4*  --   --   MONOABS 0.4  --   --   EOSABS 0.0  --   --   BASOSABS 0.0  --   --     Chemistries   Recent Labs Lab 07/05/15 1654 07/06/15 0539 07/07/15 0503  NA 134* 140 137  K 3.8 4.1 3.8  CL 99* 107 104  CO2 24 26 28   GLUCOSE 219* 125* 120*  BUN 17 11 11   CREATININE 1.16 1.08 1.17  CALCIUM 9.1 8.7* 8.7*  AST 27  --   --   ALT 34  --   --   ALKPHOS 89  --   --   BILITOT 0.5  --   --    ------------------------------------------------------------------------------------------------------------------ estimated creatinine clearance is 54.6 mL/min (by C-G formula based on Cr of 1.17). ------------------------------------------------------------------------------------------------------------------  Recent Labs  07/06/15 0539  HGBA1C 6.8*   ------------------------------------------------------------------------------------------------------------------ No results for input(s): CHOL, HDL, LDLCALC, TRIG, CHOLHDL, LDLDIRECT in the last 72  hours. ------------------------------------------------------------------------------------------------------------------ No results for input(s): TSH, T4TOTAL, T3FREE, THYROIDAB in the last 72 hours.  Invalid input(s): FREET3 ------------------------------------------------------------------------------------------------------------------ No results for input(s): VITAMINB12, FOLATE, FERRITIN, TIBC, IRON, RETICCTPCT in the last 72 hours.  Coagulation profile  Recent Labs Lab 07/05/15 2026  INR 0.98    No results for input(s): DDIMER in the last 72 hours.  Cardiac Enzymes  Recent Labs Lab 07/05/15 2026 07/06/15 0124 07/06/15 0700  TROPONINI <0.03 <0.03 0.03   ------------------------------------------------------------------------------------------------------------------ Invalid input(s): POCBNP     Time  Spent in minutes   30 minutes   ELGERGAWY, DAWOOD M.D on 07/08/2015 at 5:02 PM  Between 7am to 7pm - Pager - [phone]  After 7pm go to [email or web address] - password [facility]  [facility]   Office  [phone]

## 2015-07-08 NOTE — Consult Note (Signed)
Referring Physician: Elgergawy    Chief Complaint: Nausea, vomiting  HPI: Travis [name]. is an 81 y.o. male who was on his porch on Saturday.  Had acute onset nausea, vomiting and difficulty with gait.  Initially patient did not want to go to the hospital but eventually was convinced.  After admission with no improvement in symptoms patient was eventually convinced to have a MRI of the brain.  Acute infarct diagnosed.  Patient remains off balance and has some difficulty coordinating meals as well.   Wife reports patient had difficulty with balance for the past few weeks and has been experiencing some difficulty with memory.    Date last known well: Date: 07/05/2015 Time last known well: Time: 10:00 tPA Given: No: Outside time window  Past Medical History  Diagnosis Date  . Hypertension   . PSA elevation   . Arthritis   . Pre-diabetes   . Chronic kidney disease     stone 1990    Past Surgical History  Procedure Laterality Date  . Knee surgery    . Replacement total knee  11/20/10    left  . Joint replacement    . Inguinal hernia repair  10/06/2011    Procedure: HERNIA REPAIR INGUINAL ADULT;  Surgeon: Harl Bowie, MD;  Location: [facility];  Service: General;  Laterality: Left;  Open Left Inguinal Hernia Repair with Mesh  . Umbilical hernia repair  10/06/2011    Procedure: HERNIA REPAIR UMBILICAL ADULT;  Surgeon: Harl Bowie, MD;  Location: [facility];  Service: General;  Laterality: N/A;    Family History  Problem Relation Age of Onset  . Heart disease Father    Social History:  reports that he quit smoking about 52 years ago. His smoking use included Cigarettes and Cigars. He has a 36 pack-year smoking history. He has quit using smokeless tobacco. His smokeless tobacco use included Chew. He reports that he drinks alcohol. He reports that he does not use illicit drugs.  Allergies: No Known Allergies  Medications:  I have reviewed the patient's current  medications. Prior to Admission:  Prescriptions prior to admission  Medication Sig Dispense Refill Last Dose  . aspirin 325 MG EC tablet Take 167.5 mg by mouth every morning.    07/05/2015 at Unknown time  . finasteride (PROSCAR) 5 MG tablet Take 5 mg by mouth daily.   07/04/2015 at Unknown time  . hydrochlorothiazide (HYDRODIURIL) 50 MG tablet Take 25 mg by mouth every morning.    07/05/2015 at Unknown time  . Polyvinyl Alcohol-Povidone (REFRESH OP) Place 1 drop into the right eye daily as needed. For dry eye per patient   07/04/2015 at Unknown time  . terazosin (HYTRIN) 10 MG capsule Take 10 mg by mouth at bedtime.   07/04/2015 at Unknown time   Scheduled: . amLODipine  5 mg Oral Daily  . aspirin  162 mg Oral Daily  . finasteride  5 mg Oral Daily  . heparin  5,000 Units Subcutaneous 3 times per day  . insulin aspart  0-9 Units Subcutaneous TID WC  . sodium chloride  3 mL Intravenous Q12H  . terazosin  10 mg Oral QHS    ROS: History obtained from wife  General ROS: negative for - chills, fatigue, fever, night sweats, weight gain or weight loss Psychological ROS: memory difficulties Ophthalmic ROS: negative for - blurry vision, double vision, eye pain or loss of vision ENT ROS: negative for - epistaxis, nasal discharge, oral lesions, sore throat,  tinnitus or vertigo Allergy and Immunology ROS: negative for - hives or itchy/watery eyes Hematological and Lymphatic ROS: negative for - bleeding problems, bruising or swollen lymph nodes Endocrine ROS: negative for - galactorrhea, hair pattern changes, polydipsia/polyuria or temperature intolerance Respiratory ROS: negative for - cough, hemoptysis, shortness of breath or wheezing Cardiovascular ROS: negative for - chest pain, dyspnea on exertion, edema or irregular heartbeat Gastrointestinal ROS: negative for - abdominal pain, diarrhea, hematemesis, nausea/vomiting or stool incontinence Genito-Urinary ROS: negative for - dysuria, hematuria,  incontinence or urinary frequency/urgency Musculoskeletal ROS: left knee pain Neurological ROS: as noted in HPI Dermatological ROS: negative for rash and skin lesion changes  Physical Examination: Blood pressure 154/64, pulse 51, temperature 98.5 F (36.9 C), temperature source Oral, resp. rate 16, height 5\' 9"  (1.753 m), weight 95.845 kg (211 lb 4.8 oz), SpO2 96 %.  HEENT-  Normocephalic, no lesions, without obvious abnormality.  Normal external eye and conjunctiva.  Normal TM's bilaterally.  Normal auditory canals and external ears. Normal external nose, mucus membranes and septum.  Normal pharynx. Cardiovascular- S1, S2 normal, pulses palpable throughout   Lungs- chest clear, no wheezing, rales, normal symmetric air entry Abdomen- soft, non-tender; bowel sounds normal; no masses,  no organomegaly Extremities- no edema Lymph-no adenopathy palpable Musculoskeletal-no joint tenderness, deformity or swelling Skin-warm and dry, no hyperpigmentation, vitiligo, or suspicious lesions  Neurological Examination Mental Status: Alert, oriented, thought content appropriate.  Speech fluent without evidence of aphasia.  Able to follow 3 step commands without difficulty. Cranial Nerves: II: Discs flat bilaterally; Visual fields grossly normal, pupils equal, round, reactive to light and accommodation III,IV, VI: ptosis not present, extra-ocular motions intact bilaterally V,VII: smile symmetric, facial light touch sensation normal bilaterally VIII: hearing normal bilaterally IX,X: gag reflex present XI: bilateral shoulder shrug XII: midline tongue extension Motor: Right : Upper extremity   5/5    Left:     Upper extremity   5/5  Lower extremity   5/5     Lower extremity   5/5 Tone and bulk:normal tone throughout; no atrophy noted Sensory: Pinprick and light touch intact throughout, bilaterally Deep Tendon Reflexes: 2+ and symmetric throughout Plantars: Right: downgoing   Left:  equivocal Cerebellar: normal finger-to-nose and normal heel-to-shin testing bilaterally Gait: not tested due to patient cooperation      Laboratory Studies:  Basic Metabolic Panel:  Recent Labs Lab 07/05/15 1654 07/06/15 0539 07/07/15 0503  NA 134* 140 137  K 3.8 4.1 3.8  CL 99* 107 104  CO2 24 26 28   GLUCOSE 219* 125* 120*  BUN 17 11 11   CREATININE 1.16 1.08 1.17  CALCIUM 9.1 8.7* 8.7*    Liver Function Tests:  Recent Labs Lab 07/05/15 1654  AST 27  ALT 34  ALKPHOS 89  BILITOT 0.5  PROT 7.3  ALBUMIN 4.0    Recent Labs Lab 07/05/15 1654  LIPASE 24   No results for input(s): AMMONIA in the last 168 hours.  CBC:  Recent Labs Lab 07/05/15 1654 07/06/15 0539 07/07/15 0503  WBC 11.1* 8.3 8.0  NEUTROABS 10.2*  --   --   HGB 13.8 12.4* 12.4*  HCT 41.6 37.6* 38.8*  MCV 90.4 90.8 92.2  PLT 195 213 193    Cardiac Enzymes:  Recent Labs Lab 07/05/15 2026 07/06/15 0124 07/06/15 0700  TROPONINI <0.03 <0.03 0.03    BNP: Invalid input(s): POCBNP  CBG:  Recent Labs Lab 07/07/15 0740 07/07/15 1146 07/07/15 1637 07/07/15 2024 07/08/15 0813  GLUCAP 110* 121*  121* 130* 115*    Microbiology: Results for orders placed or performed during the hospital encounter of 07/05/15  Culture, blood (x 2)     Status: None (Preliminary result)   Collection Time: 07/05/15  8:26 PM  Result Value Ref Range Status   Specimen Description BLOOD RIGHT ARM  Final   Special Requests   Final    BOTTLES DRAWN AEROBIC AND ANAEROBIC 5ML AER 1ML ANA   Culture NO GROWTH 2 DAYS  Final   Report Status PENDING  Incomplete  Culture, blood (x 2)     Status: None (Preliminary result)   Collection Time: 07/05/15  8:30 PM  Result Value Ref Range Status   Specimen Description BLOOD LEFT ARM  Final   Special Requests   Final    BOTTLES DRAWN AEROBIC AND ANAEROBIC 5ML AER 1ML ANA   Culture NO GROWTH 2 DAYS  Final   Report Status PENDING  Incomplete    Coagulation  Studies:  Recent Labs  07/05/15 2026  LABPROT 13.2  INR 0.98    Urinalysis:  Recent Labs Lab 07/06/15 0727  COLORURINE YELLOW  LABSPEC 1.039*  PHURINE 6.0  GLUCOSEU NEGATIVE  HGBUR NEGATIVE  BILIRUBINUR NEGATIVE  KETONESUR NEGATIVE  PROTEINUR NEGATIVE  UROBILINOGEN 0.2  NITRITE NEGATIVE  LEUKOCYTESUR NEGATIVE    Lipid Panel: No results found for: CHOL, TRIG, HDL, CHOLHDL, VLDL, LDLCALC  HgbA1C:  Lab Results  Component Value Date   HGBA1C 6.8* 07/06/2015    Urine Drug Screen:  No results found for: LABOPIA, COCAINSCRNUR, LABBENZ, AMPHETMU, THCU, LABBARB  Alcohol Level: No results for input(s): ETH in the last 168 hours.  Other results: EKG: sinus rhythm at 81 bpm.  Imaging: Ct Head Wo Contrast  07/06/2015   CLINICAL DATA:  Vertigo  EXAM: CT HEAD WITHOUT CONTRAST  TECHNIQUE: Contiguous axial images were obtained from the base of the skull through the vertex without intravenous contrast.  COMPARISON:  None.  FINDINGS: No skull fracture is noted. No intracranial hemorrhage or midline shift. There is large area of decreased attenuation in the left cerebellum highly suspicious for ischemic of indeterminate age. Clinical correlation is necessary. Further correlation with MRI with diffusion imaging is recommended. Mild cerebral atrophy. Periventricular patchy subcortical white matter decreased attenuation probable due to chronic small vessel ischemic changes.  IMPRESSION: 1. There is area of decreased attenuation in left cerebellum highly suspicious for infarcts of indeterminate age. Subacute infarct cannot be excluded. Clinical correlation is necessary. Further correlation with MRI is recommended as clinically warranted. No intracranial hemorrhage or midline shift. Mild cerebral atrophy. Periventricular and patchy subcortical white matter decreased attenuation probable due to chronic small vessel ischemic changes.   Electronically Signed   By: Lahoma Crocker M.D.   On: 07/06/2015  15:51   Mr Brain Wo Contrast  07/07/2015   CLINICAL DATA:  81 year old hypertensive male with vertigo. Subsequent encounter.  EXAM: MRI HEAD WITHOUT CONTRAST  TECHNIQUE: Multiplanar, multiecho pulse sequences of the brain and surrounding structures were obtained without intravenous contrast.  COMPARISON:  07/06/2015 head CT.  FINDINGS: Exam is motion degraded.  Large acute left cerebellar infarct involving majority of the mid to inferior aspect of the left cerebellum extending into the vermis and with minimal involvement posterior right cerebellum. Mass effect with narrowing of the fourth ventricle and fourth ventricular outflow placing the patient at risk for development of hydrocephalus. Currently no hydrocephalus.  Prominent small vessel disease type changes.  Mild global atrophy.  No intracranial mass lesion noted on  this unenhanced exam.  Limited evaluation of intracranial vasculature secondary to motion degradation. The vertebral arteries, basilar artery and internal carotid arteries appear to be patent.  Polypoid opacification inferior maxillary sinuses with mild mucosal thickening ethmoid sinus air cells.  Mild degenerative changes upper cervical spine. Cervical medullary junction, pituitary region pineal region unremarkable. Post right lens replacement without orbital abnormality identified.  IMPRESSION: Exam is motion degraded.  Large acute left cerebellar infarct involving majority of the mid to inferior aspect of the left cerebellum extending into the vermis and with minimal involvement posterior right cerebellum. Mass effect with narrowing of the fourth ventricle and fourth ventricular outflow placing the patient at risk for development of hydrocephalus. Currently no hydrocephalus. Close CT surveillance recommended.   Electronically Signed   By: Genia Del M.D.   On: 07/07/2015 14:01   US Abdomen Complete  07/06/2015   CLINICAL DATA:  Evaluate biliary dilatation seen on recent CT scan.  EXAM:  ULTRASOUND ABDOMEN COMPLETE  COMPARISON:  CT scan 07/06/2015  FINDINGS: Gallbladder: Mobile echogenic foci in the gallbladder without acoustic shadowing. Possible small gallstones. There are also areas of ring down artifact most consistent with adenomyomatosis. No pericholecystic fluid or sonographic Murphy sign to suggest acute cholecystitis.  Common bile duct: Diameter: 8.0 mm  Liver: Intrahepatic biliary dilatation not well demonstrated. No focal hepatic lesions. Normal echogenicity.  IVC: Normal caliber.  Pancreas: Sonographically unremarkable.  Spleen: Normal size and echogenicity without focal lesions.  Right Kidney: Length: 11.2 cm. Normal renal cortical thickness and echogenicity without focal lesions or hydronephrosis.  Left Kidney: Length: 12.2 cm. Normal renal cortical thickness and echogenicity without focal lesions or hydronephrosis.  Abdominal aorta: Normal caliber.  Other findings: No ascites.  IMPRESSION: 1. Probable small gallstones and changes of adenomyomatosis. No findings for acute cholecystitis. 2. Normal caliber common bile duct for age. No common bile duct stones. 3. Unremarkable sonographic appearance of the liver, pancreas, spleen and both kidneys.   Electronically Signed   By: Marijo Sanes M.D.   On: 07/06/2015 10:38    Assessment: 81 y.o. male presenting with nausea, vomiting and difficulty with gait.  MRI of the brain personally reviewed and shows a large left cerebellar acute infarct.  There is some mass effect on the 4th ventricle but no evidence of hydrocephalus.  Patient actually reports beginning to feel better but has been less than cooperative.   Unclear if stroke occurred prior to the weekend with mass effect causing symptoms over the weekend or if his stroke actually occurred on 9/17.  Patient currently stable.    Stroke Risk Factors - diabetes mellitus and hypertension  Plan: 1. HgbA1c, fasting lipid panel 2. Frequent neuro checks-q 4 hours  3. PT consult, OT  consult, Speech consult 4. Echocardiogram 5. Carotid dopplers 6. Prophylactic therapy-Antiplatelet med: Plavix - dose 75mg  daily 7. NPO until RN stroke swallow screen 8. Telemetry monitoring  Case discussed with Dr. Reche Dixon, MD [facility] [phone] 07/08/2015, 10:09 AM

## 2015-07-08 NOTE — Progress Notes (Addendum)
*  PRELIMINARY RESULTS* Vascular Ultrasound Carotid Duplex (Doppler) has been completed. Findings suggest 1-39% internal carotid artery stenosis bilaterally. The left vertebral artery is patent with antegrade flow. Unable to visualize the right vertebral artery.  07/08/2015 1:45 PM Maudry Mayhew, RVT, RDCS, RDMS

## 2015-07-09 ENCOUNTER — Inpatient Hospital Stay (HOSPITAL_COMMUNITY): Payer: Medicare Other

## 2015-07-09 DIAGNOSIS — I639 Cerebral infarction, unspecified: Secondary | ICD-10-CM | POA: Insufficient documentation

## 2015-07-09 DIAGNOSIS — I10 Essential (primary) hypertension: Secondary | ICD-10-CM

## 2015-07-09 DIAGNOSIS — R112 Nausea with vomiting, unspecified: Secondary | ICD-10-CM

## 2015-07-09 DIAGNOSIS — R7309 Other abnormal glucose: Secondary | ICD-10-CM

## 2015-07-09 LAB — GLUCOSE, CAPILLARY
GLUCOSE-CAPILLARY: 141 mg/dL — AB (ref 65–99)
Glucose-Capillary: 110 mg/dL — ABNORMAL HIGH (ref 65–99)
Glucose-Capillary: 109 mg/dL — ABNORMAL HIGH (ref 65–99)
Glucose-Capillary: 118 mg/dL — ABNORMAL HIGH (ref 65–99)

## 2015-07-09 LAB — LIPID PANEL
CHOLESTEROL: 138 mg/dL (ref 0–200)
HDL: 37 mg/dL — ABNORMAL LOW (ref >40)
LDL Cholesterol: 76 mg/dL (ref 0–99)
TRIGLYCERIDES: 124 mg/dL (ref <150)
Total CHOL/HDL Ratio: 3.7 RATIO
VLDL: 25 mg/dL (ref 0–40)

## 2015-07-09 MED ORDER — IOHEXOL 350 MG/ML SOLN
50.0000 mL | Freq: Once | INTRAVENOUS | Status: AC | PRN
  Administered 2015-07-09: 50 mL via INTRAVENOUS

## 2015-07-09 NOTE — Progress Notes (Signed)
Stroke Team Progress Note  HISTORY Travis [name]. is an 81 y.o. male who was on his porch on Saturday. Had acute onset nausea, vomiting and difficulty with gait. Initially patient did not want to go to the hospital but eventually was convinced. After admission with no improvement in symptoms patient was eventually convinced to have a MRI of the brain. Acute infarct diagnosed. Patient remains off balance and has some difficulty coordinating meals as well.  Wife reports patient had difficulty with balance for the past few weeks and has been experiencing some difficulty with memory.   Date last known well: Date: 07/05/2015 Time last known well: Time: 10:00 tPA Given: No: Outside time window . He was admitted to the neuro floor bed for further evaluation and treatment.  SUBJECTIVE His  wife is at the bedside.  Overall he feels his condition is rapidly worsening.    OBJECTIVE Most recent Vital Signs: Filed Vitals:   07/09/15 0406 07/09/15 0750 07/09/15 1636 07/09/15 2004  BP: 155/76 158/71 166/71 176/68  Pulse: 62 55 54 56  Temp: 98.2 F (36.8 C) 98.3 F (36.8 C) 98.1 F (36.7 C) 98 F (36.7 C)  TempSrc: Oral Oral Oral Oral  Resp:  17 17 16   Height:      Weight:    213 lb 6.4 oz (96.798 kg)  SpO2: 96% 98% 96% 98%   CBG (last 3)   Recent Labs  07/09/15 0747 07/09/15 1131 07/09/15 1654  GLUCAP 110* 109* 141*    IV Fluid Intake:     MEDICATIONS  . clopidogrel  75 mg Oral Daily  . finasteride  5 mg Oral Daily  . heparin  5,000 Units Subcutaneous 3 times per day  . insulin aspart  0-9 Units Subcutaneous TID WC  . sodium chloride  3 mL Intravenous Q12H  . terazosin  10 mg Oral QHS   PRN:  acetaminophen **OR** acetaminophen, hydrALAZINE, hydrOXYzine, iohexol, LORazepam, meclizine, morphine injection, ondansetron (ZOFRAN) IV  Diet:  Diet heart healthy/carb modified Room service appropriate?: Yes; Fluid consistency:: Thin  liquids Activity:   Up with assistance  DVT  Prophylaxis:  SQ heparin  CLINICALLY SIGNIFICANT STUDIES Basic Metabolic Panel:  Recent Labs Lab 07/06/15 0539 07/07/15 0503  NA 140 137  K 4.1 3.8  CL 107 104  CO2 26 28  GLUCOSE 125* 120*  BUN 11 11  CREATININE 1.08 1.17  CALCIUM 8.7* 8.7*   Liver Function Tests:  Recent Labs Lab 07/05/15 1654  AST 27  ALT 34  ALKPHOS 89  BILITOT 0.5  PROT 7.3  ALBUMIN 4.0   CBC:  Recent Labs Lab 07/05/15 1654 07/06/15 0539 07/07/15 0503  WBC 11.1* 8.3 8.0  NEUTROABS 10.2*  --   --   HGB 13.8 12.4* 12.4*  HCT 41.6 37.6* 38.8*  MCV 90.4 90.8 92.2  PLT 195 213 193   Coagulation:  Recent Labs Lab 07/05/15 2026  LABPROT 13.2  INR 0.98   Cardiac Enzymes:  Recent Labs Lab 07/05/15 2026 07/06/15 0124 07/06/15 0700  TROPONINI <0.03 <0.03 0.03   Urinalysis:  Recent Labs Lab 07/06/15 0727  COLORURINE YELLOW  LABSPEC 1.039*  PHURINE 6.0  GLUCOSEU NEGATIVE  HGBUR NEGATIVE  BILIRUBINUR NEGATIVE  KETONESUR NEGATIVE  PROTEINUR NEGATIVE  UROBILINOGEN 0.2  NITRITE NEGATIVE  LEUKOCYTESUR NEGATIVE   Lipid Panel    Component Value Date/Time   CHOL 138 07/09/2015 0405   TRIG 124 07/09/2015 0405   HDL 37* 07/09/2015 0405   CHOLHDL 3.7 07/09/2015  0405   VLDL 25 07/09/2015 0405   LDLCALC 76 07/09/2015 0405   HgbA1C  Lab Results  Component Value Date   HGBA1C 6.8* 07/06/2015    Urine Drug Screen:  No results found for: LABOPIA, COCAINSCRNUR, LABBENZ, AMPHETMU, THCU, LABBARB  Alcohol Level: No results for input(s): ETH in the last 168 hours.  Ct Angio Head W/cm &/or Wo Cm  07/09/2015   CLINICAL DATA:  Acute left cerebellar infarction diagnosed 2 days ago.  EXAM: CT ANGIOGRAPHY HEAD AND NECK  TECHNIQUE: Multidetector CT imaging of the head and neck was performed using the standard protocol during bolus administration of intravenous contrast. Multiplanar CT image reconstructions and MIPs were obtained to evaluate the vascular anatomy. Carotid stenosis measurements  (when applicable) are obtained utilizing NASCET criteria, using the distal internal carotid diameter as the denominator.  CONTRAST:  44mL OMNIPAQUE IOHEXOL 350 MG/ML SOLN  COMPARISON:  Head CT 07/06/2015.  MRI 07/07/2015.  FINDINGS: CT HEAD  Extensive low-density E remains affecting the inferior cerebellum on the left and to a lesser extent the right side of the cerebellum. There is swelling with mass effect upon the fourth ventricle but no evidence of ventricular obstruction at this moment, though that is a risk. No hemorrhagic transformation. No new area of infarction is seen. Mild chronic small-vessel changes of the cerebral hemispheric white matter appear the same.  CTA NECK  Aortic arch: Atherosclerosis of the arch but no aneurysm or dissection. Branching pattern of the brachiocephalic vessels from the arch is normal.  Right carotid system: Common carotid artery is widely patent to the bifurcation. At the bifurcation, there is atherosclerotic disease with minimal diameter of the proximal internal carotid artery measuring 3.5 mm. Compared to a more distal cervical ICA diameter of 5 mm, this indicates a 30% stenosis. Remainder of the cervical internal carotid artery appears widely patent.  Left carotid system: Common carotid artery is widely patent to the bifurcation. There is soft plaque affecting the internal carotid artery bulb, but there is no narrowing of the ICA beyond the diameter of the 5 mm distal cervical internal carotid artery in therefore no stenosis.  Vertebral arteries:There is atherosclerotic disease at both vertebral artery origins. The left vertebral artery is dominant. Stenosis at the origin is no more than 30%. Beyond that, the vessel is widely patent through the cervical region. The nondominant right vertebral artery shows more severe stenosis at its origin, estimated at 70%. Beyond that, the vessel is widely patent through the cervical region  Skeleton: Curvature in degenerative disease of  the cervical spine with pronounced facet arthropathy. No acute bone finding.  Other neck: No mass or lymphadenopathy. Benign appearing pleural and parenchymal scarring at both lung apices.  CTA HEAD  Anterior circulation: The internal carotid arteries are widely patent through the siphon regions. The right internal carotid artery supplies primarily at the right middle cerebral artery territory. There is tiny A1 segment on the right. No evidence of stenosis, aneurysm or vascular malformation. The left internal carotid artery supplies the left middle cerebral artery territory and both anterior cerebral artery territories. 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 No stenosis.  Posterior circulation: Both vertebral arteries are widely patent to the basilar. No vertebral stenosis. Flow is present in both posterior inferior cerebellar arteries on today's study. The anterior inferior cerebellar arteries are patent. Superior cerebellar and posterior cerebral arteries are patent. No proximal stenosis. There is atherosclerotic narrowing of the more distal branch vessels  including 50% stenosis of the P2 segment on the left.  Venous sinuses: Patent and normal  Anatomic variants: As above  Delayed phase: None significant  IMPRESSION: Atherosclerotic disease at both carotid bifurcations. 30% stenosis of the proximal right ICA. No stenosis of the left ICA.  Stenoses at both vertebral artery origins. 30% or less narrowing of the dominant left vertebral artery origin. Estimated 70% stenosis of the nondominant right vertebral artery origin.  The vertebral arteries beyond that are widely patent to the basilar. Major posterior circulation branch vessels show flow on the present study, including both posterior inferior cerebellar arteries, both anterior inferior cerebellar arteries, both superior cerebellar arteries and both posterior cerebral arteries.  Some distal vessel atherosclerotic  narrowing and irregularity, more evident in the PCA branches. 50% stenosis of the left P2 segment.  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   Electronically Signed   By: Nelson Chimes M.D.   On: 07/09/2015 20:11   Ct Angio Neck W/cm &/or Wo/cm  07/09/2015   CLINICAL DATA:  Acute left cerebellar infarction diagnosed 2 days ago.  EXAM: CT ANGIOGRAPHY HEAD AND NECK  TECHNIQUE: Multidetector CT imaging of the head and neck was performed using the standard protocol during bolus administration of intravenous contrast. Multiplanar CT image reconstructions and MIPs were obtained to evaluate the vascular anatomy. Carotid stenosis measurements (when applicable) are obtained utilizing NASCET criteria, using the distal internal carotid diameter as the denominator.  CONTRAST:  29mL OMNIPAQUE IOHEXOL 350 MG/ML SOLN  COMPARISON:  Head CT 07/06/2015.  MRI 07/07/2015.  FINDINGS: CT HEAD  Extensive low-density E remains affecting the inferior cerebellum on the left and to a lesser extent the right side of the cerebellum. There is swelling with mass effect upon the fourth ventricle but no evidence of ventricular obstruction at this moment, though that is a risk. No hemorrhagic transformation. No new area of infarction is seen. Mild chronic small-vessel changes of the cerebral hemispheric white matter appear the same.  CTA NECK  Aortic arch: Atherosclerosis of the arch but no aneurysm or dissection. Branching pattern of the brachiocephalic vessels from the arch is normal.  Right carotid system: Common carotid artery is widely patent to the bifurcation. At the bifurcation, there is atherosclerotic disease with minimal diameter of the proximal internal carotid artery measuring 3.5 mm. Compared to a more distal cervical ICA diameter of 5 mm, this indicates a 30% stenosis. Remainder of the cervical internal carotid artery appears widely patent.  Left carotid system: Common carotid  artery is widely patent to the bifurcation. There is soft plaque affecting the internal carotid artery bulb, but there is no narrowing of the ICA beyond the diameter of the 5 mm distal cervical internal carotid artery in therefore no stenosis.  Vertebral arteries:There is atherosclerotic disease at both vertebral artery origins. The left vertebral artery is dominant. Stenosis at the origin is no more than 30%. Beyond that, the vessel is widely patent through the cervical region. The nondominant right vertebral artery shows more severe stenosis at its origin, estimated at 70%. Beyond that, the vessel is widely patent through the cervical region  Skeleton: Curvature in degenerative disease of the cervical spine with pronounced facet arthropathy. No acute bone finding.  Other neck: No mass or lymphadenopathy. Benign appearing pleural and parenchymal scarring at both lung apices.  CTA HEAD  Anterior circulation: The internal carotid arteries are widely patent through the siphon regions. The right internal carotid artery supplies  primarily at the right middle cerebral artery territory. There is tiny A1 segment on the right. No evidence of stenosis, aneurysm or vascular malformation. The left internal carotid artery supplies the left middle cerebral artery territory and both anterior cerebral artery territories. 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 No stenosis.  Posterior circulation: Both vertebral arteries are widely patent to the basilar. No vertebral stenosis. Flow is present in both posterior inferior cerebellar arteries on today's study. The anterior inferior cerebellar arteries are patent. Superior cerebellar and posterior cerebral arteries are patent. No proximal stenosis. There is atherosclerotic narrowing of the more distal branch vessels including 50% stenosis of the P2 segment on the left.  Venous sinuses: Patent and normal  Anatomic variants: As above   Delayed phase: None significant  IMPRESSION: Atherosclerotic disease at both carotid bifurcations. 30% stenosis of the proximal right ICA. No stenosis of the left ICA.  Stenoses at both vertebral artery origins. 30% or less narrowing of the dominant left vertebral artery origin. Estimated 70% stenosis of the nondominant right vertebral artery origin.  The vertebral arteries beyond that are widely patent to the basilar. Major posterior circulation branch vessels show flow on the present study, including both posterior inferior cerebellar arteries, both anterior inferior cerebellar arteries, both superior cerebellar arteries and both posterior cerebral arteries.  Some distal vessel atherosclerotic narrowing and irregularity, more evident in the PCA branches. 50% stenosis of the left P2 segment.  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   Electronically Signed   By: Nelson Chimes M.D.   On: 07/09/2015 20:11      Carotid Doppler 1-39% internal carotid artery stenosis bilaterally. The left vertebral artery is patent with antegrade flow. Unable to visualize the right vertebral artery 2D Echocardiogram  Left ventricle: The cavity size was normal. Systolic function was normal. The estimated ejection fraction was in the range of 55% to 60%. Wall motion was normal; there were no regional wall motion abnormalities.  CXR  No acute cardiopulmonary disease.  Basilar atelectasis similar to the prior chest radiograph.   EKG   Sinus rhythm Prolonged PR interval Therapy Recommendations  pending  Physical Exam   Elderly male currently not in distress. . Afebrile. Head is nontraumatic. Neck is supple without bruit.    Cardiac exam no murmur or gallop. Lungs are clear to auscultation. Distal pulses are well felt. Neurological Exam :   Awake  Alert oriented x 3. Normal speech and language.eye movements full without nystagmus.fundi were not visualized. Vision  acuity and fields appear normal. Hearing is normal. Palatal movements are normal. Face symmetric. Tongue midline. Normal strength, tone, reflexes and coordination. Normal sensation. Gait deferred. ASSESSMENT [name]. is a 81 y.o. male presenting with  nausea, vomiting, gait ataxia from left cerebellar infarct etiology to be determined. Carotid Dopplers suggest right vertebral artery occlusion. Need further evaluation with CT angiogram Infarct felt to be thrombotic*   On aspirin 325 mg orally every day prior to admission. Now on clopidogrel 75 mg orally every day for secondary stroke prevention. Patient with resultant  Gait ataxia. Stroke work up underway.   LDL 76  HbA1c 6.8   Hospital day # 4  TREATMENT/PLAN  I have personally examined this patient, reviewed notes, independently viewed imaging studies, participated in medical decision making and plan of care. I have made any additions or clarifications directly to the above note. He is at risk  for recurrent stroke, TIA, neurological worsening and needs ongoing stroke evaluation and aggressive risk factor modification. Agree with changing aspirin to Plavix for secondary stroke prevention. Discussed the patient and wife and answered questions  Antony Contras, MD Medical Director [place] Pager: [phone] 07/09/2015 9:31 PM     To contact Stroke Continuity Jaspreet Bodner, please refer to [email or web address] After hours, contact General Neurology

## 2015-07-09 NOTE — Progress Notes (Signed)
Patient Demographics  Travis [name], is a 81 y.o. male, DOB - 1930-08-19, QQP:[id number]  Admit date - 07/05/2015   Admitting Physician Ivor Costa, MD  Outpatient Primary MD for the patient is Leonard Downing, MD  LOS - 4   Chief Complaint  Patient presents with  . Nausea  . Emesis         Subjective:   Travis [name] reported feeling better, denies vertigo. Sitting in chair, reported still unsteady, but walked with physical therapy.  Assessment & Plan    Principal Problem:   Nausea & vomiting Active Problems:   Hypertension   Arthritis   Pre-diabetes   Rash   Sepsis   Cerebral thrombosis with cerebral infarction  Acute CVA  -  MRI brain showing large left cerebellar acute infarct, with some mass effect on fourth ventricle but no evidence of hydrocephalus  - Continue with  Neuro Checks every 4 hours for next 48 hours given mass effects on fourth ventricle - Patient on aspirin at home, was changed to Plavix by neurology  - glycohemoglobin A1c is 6.8 - Carotid Dopplers :1-39% internal carotid artery stenosis bilaterally. The left vertebral artery is patent with antegrade flow. Unable to visualize the right vertebral artery. - 2-D echo with EF 55%, unremarkable - ldl 76, hdl 37 - Continue with telemetry monitoring - patient been seen by PT and OT , recommend SNF, social worker notified. -neurology ordered CT angio head/neck, will f/u on neuro recommendations.  Nausea and vomiting - resolved , possibly related to his CVA versus gastroenteritis .  Vertigo - Initially thought to be secondary to BPV versus dehydration , but CT head with evidence of age-indeterminate infarct , so MRI brain was obtained and showing large left cerebellar acute infarct   Hypertension - Continue to hold atenolol and hydrochlorothiazide in the setting of dehydration , continue to hold atenolol giving his  bradycardia . - started on Norvasc, will discontinue to allow for permissive attention. -consider lisinopril for bp control, in the setting of prediabetes and cva.  Prediabetes - CBGs are acceptable - hemoglobin A1c 6.8    Code Status: Full  Family Communication: None at bedside  Disposition Plan: Pending further workup. Likely SNF   Procedures  None   Consults   neurology   Medications  Scheduled Meds: . clopidogrel  75 mg Oral Daily  . finasteride  5 mg Oral Daily  . heparin  5,000 Units Subcutaneous 3 times per day  . insulin aspart  0-9 Units Subcutaneous TID WC  . sodium chloride  3 mL Intravenous Q12H  . terazosin  10 mg Oral QHS   Continuous Infusions:   PRN Meds:.acetaminophen **OR** acetaminophen, hydrALAZINE, hydrOXYzine, iohexol, LORazepam, meclizine, morphine injection, ondansetron (ZOFRAN) IV  DVT Prophylaxis  - Heparin   Lab Results  Component Value Date   PLT 193 07/07/2015    Antibiotics    Anti-infectives    None          Objective:   Filed Vitals:   07/08/15 1646 07/08/15 2035 07/09/15 0406 07/09/15 0750  BP: 153/66 126/59 155/76 158/71  Pulse: 55 52 62 55  Temp: 98.8 F (37.1 C) 98.1 F (36.7 C) 98.2 F (36.8 C) 98.3 F (36.8 C)  TempSrc:  Oral Oral Oral Oral  Resp: 18 18  17   Height:      Weight:      SpO2: 98% 94% 96% 98%    Wt Readings from Last 3 Encounters:  07/07/15 211 lb 4.8 oz (95.845 kg)  10/28/11 213 lb (96.616 kg)  10/04/11 212 lb (96.163 kg)     Intake/Output Summary (Last 24 hours) at 07/09/15 1624 Last data filed at 07/09/15 1404  Gross per 24 hour  Intake   1040 ml  Output   1950 ml  Net   -910 ml     Physical Exam  Awake Alert, Oriented Supple Neck,No JVD, No cervical lymphadenopathy appriciated.  Symmetrical Chest wall movement, Good air movement bilaterally,  RRR,No Gallops,Rubs or new Murmurs, No Parasternal Heave +ve B.Sounds, Abd Soft, No tenderness, No organomegaly appriciated, No  Cyanosis, Clubbing or edema, No new Rash or bruise     Data Review   Micro Results Recent Results (from the past 240 hour(s))  Culture, blood (x 2)     Status: None (Preliminary result)   Collection Time: 07/05/15  8:26 PM  Result Value Ref Range Status   Specimen Description BLOOD RIGHT ARM  Final   Special Requests   Final    BOTTLES DRAWN AEROBIC AND ANAEROBIC 5ML AER 1ML ANA   Culture NO GROWTH 4 DAYS  Final   Report Status PENDING  Incomplete  Culture, blood (x 2)     Status: None (Preliminary result)   Collection Time: 07/05/15  8:30 PM  Result Value Ref Range Status   Specimen Description BLOOD LEFT ARM  Final   Special Requests   Final    BOTTLES DRAWN AEROBIC AND ANAEROBIC 5ML AER 1ML ANA   Culture NO GROWTH 4 DAYS  Final   Report Status PENDING  Incomplete    Radiology Reports Dg Chest 2 View  07/05/2015   CLINICAL DATA:  Nausea vomiting for 9 hours. Occasional cough. No chest pain. Coarse basilar breast sounds.  EXAM: CHEST  2 VIEW  COMPARISON:  11/23/2010  FINDINGS: Cardiac silhouette is normal in size and configuration. Aorta is mildly tortuous. No mediastinal or hilar masses or evidence of adenopathy.  Mild lung base opacity is noted, similar to the prior chest radiographs, consistent with atelectasis. No convincing pneumonia no pulmonary edema. No pleural effusion or pneumothorax.  Bony thorax is demineralized but grossly intact.  IMPRESSION: 1. No acute cardiopulmonary disease. 2. Basilar atelectasis similar to the prior chest radiograph.   Electronically Signed   By: Lajean Manes M.D.   On: 07/05/2015 18:29   Ct Head Wo Contrast  07/06/2015   CLINICAL DATA:  Vertigo  EXAM: CT HEAD WITHOUT CONTRAST  TECHNIQUE: Contiguous axial images were obtained from the base of the skull through the vertex without intravenous contrast.  COMPARISON:  None.  FINDINGS: No skull fracture is noted. No intracranial hemorrhage or midline shift. There is large area of decreased attenuation  in the left cerebellum highly suspicious for ischemic of indeterminate age. Clinical correlation is necessary. Further correlation with MRI with diffusion imaging is recommended. Mild cerebral atrophy. Periventricular patchy subcortical white matter decreased attenuation probable due to chronic small vessel ischemic changes.  IMPRESSION: 1. There is area of decreased attenuation in left cerebellum highly suspicious for infarcts of indeterminate age. Subacute infarct cannot be excluded. Clinical correlation is necessary. Further correlation with MRI is recommended as clinically warranted. No intracranial hemorrhage or midline shift. Mild cerebral atrophy. Periventricular and patchy subcortical white matter  decreased attenuation probable due to chronic small vessel ischemic changes.   Electronically Signed   By: Lahoma Crocker M.D.   On: 07/06/2015 15:51   Mr Brain Wo Contrast  07/07/2015   CLINICAL DATA:  81 year old hypertensive male with vertigo. Subsequent encounter.  EXAM: MRI HEAD WITHOUT CONTRAST  TECHNIQUE: Multiplanar, multiecho pulse sequences of the brain and surrounding structures were obtained without intravenous contrast.  COMPARISON:  07/06/2015 head CT.  FINDINGS: Exam is motion degraded.  Large acute left cerebellar infarct involving majority of the mid to inferior aspect of the left cerebellum extending into the vermis and with minimal involvement posterior right cerebellum. Mass effect with narrowing of the fourth ventricle and fourth ventricular outflow placing the patient at risk for development of hydrocephalus. Currently no hydrocephalus.  Prominent small vessel disease type changes.  Mild global atrophy.  No intracranial mass lesion noted on this unenhanced exam.  Limited evaluation of intracranial vasculature secondary to motion degradation. The vertebral arteries, basilar artery and internal carotid arteries appear to be patent.  Polypoid opacification inferior maxillary sinuses with mild  mucosal thickening ethmoid sinus air cells.  Mild degenerative changes upper cervical spine. Cervical medullary junction, pituitary region pineal region unremarkable. Post right lens replacement without orbital abnormality identified.  IMPRESSION: Exam is motion degraded.  Large acute left cerebellar infarct involving majority of the mid to inferior aspect of the left cerebellum extending into the vermis and with minimal involvement posterior right cerebellum. Mass effect with narrowing of the fourth ventricle and fourth ventricular outflow placing the patient at risk for development of hydrocephalus. Currently no hydrocephalus. Close CT surveillance recommended.   Electronically Signed   By: Genia Del M.D.   On: 07/07/2015 14:01   US Abdomen Complete  07/06/2015   CLINICAL DATA:  Evaluate biliary dilatation seen on recent CT scan.  EXAM: ULTRASOUND ABDOMEN COMPLETE  COMPARISON:  CT scan 07/06/2015  FINDINGS: Gallbladder: Mobile echogenic foci in the gallbladder without acoustic shadowing. Possible small gallstones. There are also areas of ring down artifact most consistent with adenomyomatosis. No pericholecystic fluid or sonographic Murphy sign to suggest acute cholecystitis.  Common bile duct: Diameter: 8.0 mm  Liver: Intrahepatic biliary dilatation not well demonstrated. No focal hepatic lesions. Normal echogenicity.  IVC: Normal caliber.  Pancreas: Sonographically unremarkable.  Spleen: Normal size and echogenicity without focal lesions.  Right Kidney: Length: 11.2 cm. Normal renal cortical thickness and echogenicity without focal lesions or hydronephrosis.  Left Kidney: Length: 12.2 cm. Normal renal cortical thickness and echogenicity without focal lesions or hydronephrosis.  Abdominal aorta: Normal caliber.  Other findings: No ascites.  IMPRESSION: 1. Probable small gallstones and changes of adenomyomatosis. No findings for acute cholecystitis. 2. Normal caliber common bile duct for age. No common bile  duct stones. 3. Unremarkable sonographic appearance of the liver, pancreas, spleen and both kidneys.   Electronically Signed   By: Marijo Sanes M.D.   On: 07/06/2015 10:38   Ct Abdomen Pelvis W Contrast  07/06/2015   CLINICAL DATA:  New onset nausea and vomiting. Diffuse abdominal tenderness. [place] out bowel obstruction.  EXAM: CT ABDOMEN AND PELVIS WITH CONTRAST  TECHNIQUE: Multidetector CT imaging of the abdomen and pelvis was performed using the standard protocol following bolus administration of intravenous contrast.  CONTRAST:  154mL OMNIPAQUE IOHEXOL 300 MG/ML  SOLN  COMPARISON:  01/07/2012  FINDINGS: Lower chest: Volume loss in both lower lobes with linear atelectasis.  Liver: Mild intrahepatic biliary ductal dilatation. Tiny 6 mm hypodensity in the subcapsular  right hepatic lobe, too small to characterize.  Hepatobiliary: Prominent common bile duct measuring 9 mm at the porta hepatis. Gallbladder is physiologically distended. Small layering stones or sludge.  Pancreas: No pancreatic ductal dilatation or surrounding inflammation. Mildly atrophic.  Spleen: Normal in size without focal lesion. Trace perisplenic fluid.  Adrenal glands: No nodule.  Kidneys: Symmetric renal enhancement. No hydronephrosis. Small bilateral cortical cysts.  Stomach/Bowel: Small hiatal hernia. Stomach physiologically distended. There are no dilated or thickened small bowel loops. Small-moderate volume of stool throughout the colon without colonic wall thickening. Cecum is high-riding in the mid abdomen. The appendix is normal.  Vascular/Lymphatic: No retroperitoneal adenopathy. Abdominal aorta is normal in caliber. Atherosclerosis of the abdominal aorta without aneurysm.  Reproductive: Marked enlargement of the prostate gland, measuring at least 9.4 x 8.4 cm in by axial dimension. This causes mass effect on the bladder base.  Bladder: Distended.  No wall thickening.  Other: No free air, free fluid, or intra-abdominal fluid  collection. Fat containing bilateral inguinal hernias, left larger than right. No pelvic adenopathy.  Musculoskeletal: There are no acute or suspicious osseous abnormalities.  IMPRESSION: 1. No bowel obstruction.  No signs of bowel inflammation. 2. Mild intrahepatic biliary ductal dilatation, this is new from prior exam. Common bile duct appears prominent measuring 9 mm. No abnormal gallbladder distention. Significance is uncertain. If patient is able tolerate breath hold technique in there is clinical concern for hepatobiliary pathology, an MRCP could be considered. 3. Marked enlargement of the prostate gland, similar to prior exam.   Electronically Signed   By: Jeb Levering M.D.   On: 07/06/2015 01:12     CBC  Recent Labs Lab 07/05/15 1654 07/06/15 0539 07/07/15 0503  WBC 11.1* 8.3 8.0  HGB 13.8 12.4* 12.4*  HCT 41.6 37.6* 38.8*  PLT 195 213 193  MCV 90.4 90.8 92.2  MCH 30.0 30.0 29.5  MCHC 33.2 33.0 32.0  RDW 13.3 13.4 13.5  LYMPHSABS 0.4*  --   --   MONOABS 0.4  --   --   EOSABS 0.0  --   --   BASOSABS 0.0  --   --     Chemistries   Recent Labs Lab 07/05/15 1654 07/06/15 0539 07/07/15 0503  NA 134* 140 137  K 3.8 4.1 3.8  CL 99* 107 104  CO2 24 26 28   GLUCOSE 219* 125* 120*  BUN 17 11 11   CREATININE 1.16 1.08 1.17  CALCIUM 9.1 8.7* 8.7*  AST 27  --   --   ALT 34  --   --   ALKPHOS 89  --   --   BILITOT 0.5  --   --    ------------------------------------------------------------------------------------------------------------------ estimated creatinine clearance is 54.6 mL/min (by C-G formula based on Cr of 1.17). ------------------------------------------------------------------------------------------------------------------ No results for input(s): HGBA1C in the last 72 hours. ------------------------------------------------------------------------------------------------------------------  Recent Labs  07/09/15 0405  CHOL 138  HDL 37*  LDLCALC 76    TRIG 124  CHOLHDL 3.7   ------------------------------------------------------------------------------------------------------------------ No results for input(s): TSH, T4TOTAL, T3FREE, THYROIDAB in the last 72 hours.  Invalid input(s): FREET3 ------------------------------------------------------------------------------------------------------------------ No results for input(s): VITAMINB12, FOLATE, FERRITIN, TIBC, IRON, RETICCTPCT in the last 72 hours.  Coagulation profile  Recent Labs Lab 07/05/15 2026  INR 0.98    No results for input(s): DDIMER in the last 72 hours.  Cardiac Enzymes  Recent Labs Lab 07/05/15 2026 07/06/15 0124 07/06/15 0700  TROPONINI <0.03 <0.03 0.03   ------------------------------------------------------------------------------------------------------------------ Invalid input(s): POCBNP  Time Spent in minutes   30 minutes   Xu,Fang MD PhD on 07/09/2015 at 4:24 PM  Between 7am to 7pm - Pager - [phone]  After 7pm go to [email or web address] - password [facility]  [facility]   Office  [phone]

## 2015-07-09 NOTE — Progress Notes (Signed)
Physical Therapy Treatment Patient Details Name: Travis [name]. MRN: [id number] DOB: 11/05/1929 Today's Date: 07/09/2015    History of Present Illness  81 y.o. male with PMH of prediabetes, hypertension, arthritis, elevated PSA, who presents with nausea, vomiting. Reports nausea typically occurs with moving/changing positions, increased symptoms of nausea with brief gaze stabilization assessment. MRI (+) Large acute left cerebellar infarct involving majority of the mid to inferior aspect of the left cerebellum extending into the vermis and with minimal involvement posterior right cerebellum. Mass effect with narrowing of the fourth ventricle and fourth ventricular outflow placing the patient at risk for development of hydrocephalus    PT Comments    Progressing with mobility with "reported" decreased dizziness, but balance still all over the place with poor deficit awareness and not following cues for safety.  Still feel he will need SNF level rehab for safest d/c.  Follow Up Recommendations  SNF;Supervision/Assistance - 24 hour     Equipment Recommendations  Rolling walker with 5" wheels    Recommendations for Other Services       Precautions / Restrictions Precautions Precautions: Fall Precaution Comments: HIGH fall risk due to balance, awareness, impulsivity (reports falling in hospital, not reported to nursing)    Mobility  Bed Mobility               General bed mobility comments: OOB in chair  Transfers Overall transfer level: Needs assistance Equipment used: 1 person hand held assist Transfers: Sit to/from Stand Sit to Stand: Min assist         General transfer comment: assist for balance  Ambulation/Gait Ambulation/Gait assistance: Mod assist;Min assist Ambulation Distance (Feet): 400 Feet Assistive device: 1 person hand held assist Gait Pattern/deviations: Step-through pattern;Decreased stride length     General Gait Details: looking all around  despite cues for keeping forward focus to help balance, staggers to side when looking around but denies dizziness basically all throughout session except for horizontal head turns in sitting.     Stairs            Wheelchair Mobility    Modified Rankin (Stroke Patients Only) Modified Rankin (Stroke Patients Only) Pre-Morbid Rankin Score: No symptoms Modified Rankin: Moderately severe disability     Balance Overall balance assessment: Needs assistance           Standing balance-Leahy Scale: Poor                 High Level Balance Comments: side stepping, forwards and backwards march with railing and HHA    Cognition Arousal/Alertness: Awake/alert Behavior During Therapy: Impulsive Overall Cognitive Status: Impaired/Different from baseline Area of Impairment: Awareness;Safety/judgement;Attention   Current Attention Level: Sustained     Safety/Judgement: Decreased awareness of deficits;Decreased awareness of safety          Exercises      General Comments General comments (skin integrity, edema, etc.): seated head turns x 5 reported 2-3/10 dizziness, vertical head turns x 5 no dizziness, reaching to floor x 1 on either side no dizziness, sit to stand x 5 reports no dizziness (+ off balance)      Pertinent Vitals/Pain Pain Assessment: No/denies pain    Home Living                      Prior Function            PT Goals (current goals can now be found in the care plan section) Progress towards PT goals: Progressing  toward goals    Frequency  Min 4X/week    PT Plan      Co-evaluation             End of Session Equipment Utilized During Treatment: Gait belt Activity Tolerance: Patient tolerated treatment well Patient left: in chair;with call bell/phone within reach     Time: 1609-1630 PT Time Calculation (min) (ACUTE ONLY): 21 min  Charges:  $Gait Training: 8-22 mins                    G Codes:       Isola Mehlman,[name] Jul 31, 2015, 4:54 PM  Magda Kiel, [place] 07-31-2015

## 2015-07-10 ENCOUNTER — Encounter (HOSPITAL_COMMUNITY)
Admission: EM | Disposition: A | Discharge status: Skilled Nursing Facility | Payer: Self-pay | Source: Home / Self Care | Attending: Internal Medicine

## 2015-07-10 DIAGNOSIS — I639 Cerebral infarction, unspecified: Secondary | ICD-10-CM

## 2015-07-10 DIAGNOSIS — I6330 Cerebral infarction due to thrombosis of unspecified cerebral artery: Secondary | ICD-10-CM

## 2015-07-10 HISTORY — PX: EP IMPLANTABLE DEVICE: SHX172B

## 2015-07-10 LAB — CULTURE, BLOOD (ROUTINE X 2)
CULTURE: NO GROWTH
CULTURE: NO GROWTH

## 2015-07-10 LAB — GLUCOSE, CAPILLARY
GLUCOSE-CAPILLARY: 110 mg/dL — AB (ref 65–99)
GLUCOSE-CAPILLARY: 129 mg/dL — AB (ref 65–99)
GLUCOSE-CAPILLARY: 105 mg/dL — AB (ref 65–99)
GLUCOSE-CAPILLARY: 151 mg/dL — AB (ref 65–99)

## 2015-07-10 SURGERY — LOOP RECORDER INSERTION

## 2015-07-10 MED ORDER — SENNOSIDES-DOCUSATE SODIUM 8.6-50 MG PO TABS
2.0000 | ORAL_TABLET | Freq: Two times a day (BID) | ORAL | Status: DC
  Administered 2015-07-10 (×2): 2 via ORAL
  Filled 2015-07-10 (×3): qty 2

## 2015-07-10 MED ORDER — LIDOCAINE-EPINEPHRINE 1 %-1:100000 IJ SOLN
INTRAMUSCULAR | Status: AC
  Filled 2015-07-10: qty 1

## 2015-07-10 MED ORDER — LISINOPRIL 5 MG PO TABS
5.0000 mg | ORAL_TABLET | Freq: Every day | ORAL | Status: DC
  Administered 2015-07-10 – 2015-07-11 (×2): 5 mg via ORAL
  Filled 2015-07-10 (×2): qty 1

## 2015-07-10 MED ORDER — ENOXAPARIN SODIUM 40 MG/0.4ML ~~LOC~~ SOLN
40.0000 mg | SUBCUTANEOUS | Status: DC
  Administered 2015-07-10: 40 mg via SUBCUTANEOUS
  Filled 2015-07-10: qty 0.4

## 2015-07-10 MED ORDER — LIDOCAINE-EPINEPHRINE 1 %-1:100000 IJ SOLN
INTRAMUSCULAR | Status: DC | PRN
Start: 2015-07-10 — End: 2015-07-10
  Administered 2015-07-10: 30 mL via INTRADERMAL

## 2015-07-10 SURGICAL SUPPLY — 2 items
LOOP REVEAL LINQSYS (Prosthesis & Implant Heart) ×2 IMPLANT
PACK LOOP INSERTION (CUSTOM PROCEDURE TRAY) ×3 IMPLANT

## 2015-07-10 NOTE — Evaluation (Signed)
Occupational Therapy Evaluation Patient Details Name: Travis [name]. MRN: [id number] DOB: 1930/07/09 Today's Date: 07/10/2015    History of Present Illness  81 y.o. male with PMH of prediabetes, hypertension, arthritis, elevated PSA, who presents with nausea, vomiting. Reports nausea typically occurs with moving/changing positions, increased symptoms of nausea with brief gaze stabilization assessment. MRI (+) Large acute left cerebellar infarct involving majority of the mid to inferior aspect of the left cerebellum extending into the vermis and with minimal involvement posterior right cerebellum. Mass effect with narrowing of the fourth ventricle and fourth ventricular outflow placing the patient at risk for development of hydrocephalus   Clinical Impression   Pt denies dizziness with bed mobility and ambulation/transfers, but continues to be unsteady with decreased awareness of deficits and impulsivity.  Pt readily willing to perform OOB activities and remain up in chair.  Family in room and supportive of pt going to [place] rehab.    Follow Up Recommendations  SNF;Supervision/Assistance - 24 hour    Equipment Recommendations       Recommendations for Other Services       Precautions / Restrictions Precautions Precautions: Fall      Mobility Bed Mobility Overal bed mobility: Needs Assistance Bed Mobility: Supine to Sit     Supine to sit: Supervision;HOB elevated     General bed mobility comments: supervision for safety, no physical assist  Transfers Overall transfer level: Needs assistance Equipment used: Rolling walker (2 wheeled) Transfers: Sit to/from Stand Sit to Stand: Min guard         General transfer comment: verbal cues for hand placement and to stand momentarily prior to ambulating    Balance     Sitting balance-Leahy Scale: Fair       Standing balance-Leahy Scale: Poor                              ADL Overall ADL's : Needs  assistance/impaired     Grooming: Wash/dry hands;Set up;Sitting           Upper Body Dressing : Supervision/safety;Sitting Upper Body Dressing Details (indicate cue type and reason): front opening gown Lower Body Dressing: Moderate assistance;Sit to/from stand   Toilet Transfer: Minimal assistance;Ambulation;BSC;RW Toilet Transfer Details (indicate cue type and reason): verbal cues to slow pace and avoid sitting abruptly Toileting- Clothing Manipulation and Hygiene: Minimal assistance;Sit to/from stand       Functional mobility during ADLs: Minimal assistance;Rolling walker General ADL Comments: Pt immediately agreeable to OOB to use BSC and remain up in chair.  Family encouraging pt as well.     Vision     Perception     Praxis      Pertinent Vitals/Pain Pain Assessment: No/denies pain     Hand Dominance     Extremity/Trunk Assessment             Communication     Cognition Arousal/Alertness: Awake/alert Behavior During Therapy: Impulsive Overall Cognitive Status: Impaired/Different from baseline Area of Impairment: Awareness;Safety/judgement         Safety/Judgement: Decreased awareness of deficits;Decreased awareness of safety     General Comments: pt more agreeable to ST rehab, although states he feels he would do better at home   General Comments       Exercises       Shoulder Instructions      Home Living  Prior Functioning/Environment               OT Diagnosis:     OT Problem List:     OT Treatment/Interventions:      OT Goals(Current goals can be found in the care plan section) Acute Rehab OT Goals Patient Stated Goal: pt agreeable to go to SNF for ST rehab  OT Frequency: Min 2X/week   Barriers to D/C:            Co-evaluation              End of Session Equipment Utilized During Treatment: Rolling walker;Gait belt  Activity Tolerance: Patient  tolerated treatment well Patient left: in chair;with call bell/phone within reach;with chair alarm set   Time: 4270-6237 OT Time Calculation (min): 23 min Charges:  OT Evaluation $Initial OT Evaluation Tier I: 1 Procedure OT Treatments $Self Care/Home Management : 23-37 mins G-Codes:    Malka So 07/10/2015, 1:23 PM  [phone]

## 2015-07-10 NOTE — Consult Note (Signed)
ELECTROPHYSIOLOGY CONSULT NOTE  Patient ID: Travis [name]. MRN: [id number], DOB/AGE: December 26, 1929   Admit date: 07/05/2015 Date of Consult: 07/10/2015  Primary Physician: Leonard Downing, MD Primary Cardiologist: new to [facility] Reason for Consultation: Cryptogenic stroke; recommendations regarding Implantable Loop Recorder  History of Present Illness Treveon Bourcier. was admitted on 07/05/2015 with acute onset nausea, vomiting, and difficulty with gait. He had been sitting outside when his symptoms developed.  His symptoms persisted and his family talked him into going to the hospital for evaluation.  Prior to this episode, he has had some increasing difficulty with memory over the last few weeks but no symptoms like he had on the day of admission. Imaging demonstrated left cerebellar infarct.  He has undergone workup for stroke including echocardiogram and carotid imaging.  The patient has been monitored on telemetry which has demonstrated sinus rhythm with no arrhythmias.   Echocardiogram this admission demonstrated EF 55-60%, no RWMA, mild AR, LA 34.     Prior to admission, the patient denies chest pain, shortness of breath, dizziness, palpitations, or syncope.  They are recovering from their stroke with plans to return home at discharge.  EP has been asked to evaluate for placement of an implantable loop recorder to monitor for atrial fibrillation.   Past Medical History  Diagnosis Date  . Hypertension   . PSA elevation   . Arthritis   . Pre-diabetes   . Chronic kidney disease     stone 1990     Surgical History:  Past Surgical History  Procedure Laterality Date  . Knee surgery    . Replacement total knee  11/20/10    left  . Joint replacement    . Inguinal hernia repair  10/06/2011    Procedure: HERNIA REPAIR INGUINAL ADULT;  Surgeon: Harl Bowie, MD;  Location: [facility];  Service: General;  Laterality: Left;  Open Left Inguinal Hernia Repair with Mesh    . Umbilical hernia repair  10/06/2011    Procedure: HERNIA REPAIR UMBILICAL ADULT;  Surgeon: Harl Bowie, MD;  Location: [facility];  Service: General;  Laterality: N/A;     Prescriptions prior to admission  Medication Sig Dispense Refill Last Dose  . aspirin 325 MG EC tablet Take 167.5 mg by mouth every morning.    07/05/2015 at Unknown time  . finasteride (PROSCAR) 5 MG tablet Take 5 mg by mouth daily.   07/04/2015 at Unknown time  . hydrochlorothiazide (HYDRODIURIL) 50 MG tablet Take 25 mg by mouth every morning.    07/05/2015 at Unknown time  . Polyvinyl Alcohol-Povidone (REFRESH OP) Place 1 drop into the right eye daily as needed. For dry eye per patient   07/04/2015 at Unknown time  . terazosin (HYTRIN) 10 MG capsule Take 10 mg by mouth at bedtime.   07/04/2015 at Unknown time    Inpatient Medications:  . clopidogrel  75 mg Oral Daily  . finasteride  5 mg Oral Daily  . heparin  5,000 Units Subcutaneous 3 times per day  . insulin aspart  0-9 Units Subcutaneous TID WC  . lisinopril  5 mg Oral Daily  . senna-docusate  2 tablet Oral BID  . sodium chloride  3 mL Intravenous Q12H  . terazosin  10 mg Oral QHS    Allergies: No Known Allergies  Social History   Social History  . Marital Status: Married    Spouse Name: N/A  . Number of Children: N/A  . Years of  Education: N/A   Occupational History  . Not on file.   Social History Main Topics  . Smoking status: Former Smoker -- 2.00 packs/day for 18 years    Types: Cigarettes, Cigars    Quit date: 10/03/1962  . Smokeless tobacco: Former Systems developer    Types: Chew  . Alcohol Use: Yes     Comment: vodka socially  . Drug Use: No  . Sexual Activity: Not on file   Other Topics Concern  . Not on file   Social History Narrative     Family History  Problem Relation Age of Onset  . Heart disease Father       Review of Systems: All other systems reviewed and are otherwise negative except as noted above.  Physical  Exam: Filed Vitals:   07/09/15 1636 07/09/15 2004 07/10/15 0441 07/10/15 0935  BP: 166/71 176/68 162/82 161/68  Pulse: 54 56 52 54  Temp: 98.1 F (36.7 C) 98 F (36.7 C) 97.8 F (36.6 C) 98.9 F (37.2 C)  TempSrc: Oral Oral Oral Oral  Resp: 17 16 17 18   Height:      Weight:  213 lb 6.4 oz (96.798 kg)    SpO2: 96% 98% 97% 98%    GEN- The patient is elderly appearing, alert and oriented x 3 today.   Head- normocephalic, atraumatic Eyes-  Sclera clear, conjunctiva pink Ears- hearing intact Oropharynx- clear Neck- supple Lungs- Clear to ausculation bilaterally, normal work of breathing Heart- Regular rate and rhythm  GI- soft, NT, ND, + BS Extremities- no clubbing, cyanosis, or edema MS- no significant deformity or atrophy Skin- no rash or lesion Psych- euthymic mood, full affect   Labs:   Lab Results  Component Value Date   WBC 8.0 07/07/2015   HGB 12.4* 07/07/2015   HCT 38.8* 07/07/2015   MCV 92.2 07/07/2015   PLT 193 07/07/2015    Recent Labs Lab 07/05/15 1654  07/07/15 0503  NA 134*  < > 137  K 3.8  < > 3.8  CL 99*  < > 104  CO2 24  < > 28  BUN 17  < > 11  CREATININE 1.16  < > 1.17  CALCIUM 9.1  < > 8.7*  PROT 7.3  --   --   BILITOT 0.5  --   --   ALKPHOS 89  --   --   ALT 34  --   --   AST 27  --   --   GLUCOSE 219*  < > 120*  < > = values in this interval not displayed.   Radiology/Studies: Ct Angio Head W/cm &/or Wo Cm 07/09/2015   CLINICAL DATA:  Acute left cerebellar infarction diagnosed 2 days ago.  EXAM: CT ANGIOGRAPHY HEAD AND NECK  TECHNIQUE: Multidetector CT imaging of the head and neck was performed using the standard protocol during bolus administration of intravenous contrast. Multiplanar CT image reconstructions and MIPs were obtained to evaluate the vascular anatomy. Carotid stenosis measurements (when applicable) are obtained utilizing NASCET criteria, using the distal internal carotid diameter as the denominator.  CONTRAST:  60mL OMNIPAQUE  IOHEXOL 350 MG/ML SOLN  COMPARISON:  Head CT 07/06/2015.  MRI 07/07/2015.  FINDINGS: CT HEAD  Extensive low-density E remains affecting the inferior cerebellum on the left and to a lesser extent the right side of the cerebellum. There is swelling with mass effect upon the fourth ventricle but no evidence of ventricular obstruction at this moment, though that is a risk. No  hemorrhagic transformation. No new area of infarction is seen. Mild chronic small-vessel changes of the cerebral hemispheric white matter appear the same.  CTA NECK  Aortic arch: Atherosclerosis of the arch but no aneurysm or dissection. Branching pattern of the brachiocephalic vessels from the arch is normal.  Right carotid system: Common carotid artery is widely patent to the bifurcation. At the bifurcation, there is atherosclerotic disease with minimal diameter of the proximal internal carotid artery measuring 3.5 mm. Compared to a more distal cervical ICA diameter of 5 mm, this indicates a 30% stenosis. Remainder of the cervical internal carotid artery appears widely patent.  Left carotid system: Common carotid artery is widely patent to the bifurcation. There is soft plaque affecting the internal carotid artery bulb, but there is no narrowing of the ICA beyond the diameter of the 5 mm distal cervical internal carotid artery in therefore no stenosis.  Vertebral arteries:There is atherosclerotic disease at both vertebral artery origins. The left vertebral artery is dominant. Stenosis at the origin is no more than 30%. Beyond that, the vessel is widely patent through the cervical region. The nondominant right vertebral artery shows more severe stenosis at its origin, estimated at 70%. Beyond that, the vessel is widely patent through the cervical region  Skeleton: Curvature in degenerative disease of the cervical spine with pronounced facet arthropathy. No acute bone finding.  Other neck: No mass or lymphadenopathy. Benign appearing pleural and  parenchymal scarring at both lung apices.  CTA HEAD  Anterior circulation: The internal carotid arteries are widely patent through the siphon regions. The right internal carotid artery supplies primarily at the right middle cerebral artery territory. There is tiny A1 segment on the right. No evidence of stenosis, aneurysm or vascular malformation. The left internal carotid artery supplies the left middle cerebral artery territory and both anterior cerebral artery territories. 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 No stenosis.  Posterior circulation: Both vertebral arteries are widely patent to the basilar. No vertebral stenosis. Flow is present in both posterior inferior cerebellar arteries on today's study. The anterior inferior cerebellar arteries are patent. Superior cerebellar and posterior cerebral arteries are patent. No proximal stenosis. There is atherosclerotic narrowing of the more distal branch vessels including 50% stenosis of the P2 segment on the left.  Venous sinuses: Patent and normal  Anatomic variants: As above  Delayed phase: None significant  IMPRESSION: Atherosclerotic disease at both carotid bifurcations. 30% stenosis of the proximal right ICA. No stenosis of the left ICA.  Stenoses at both vertebral artery origins. 30% or less narrowing of the dominant left vertebral artery origin. Estimated 70% stenosis of the nondominant right vertebral artery origin.  The vertebral arteries beyond that are widely patent to the basilar. Major posterior circulation branch vessels show flow on the present study, including both posterior inferior cerebellar arteries, both anterior inferior cerebellar arteries, both superior cerebellar arteries and both posterior cerebral arteries.  Some distal vessel atherosclerotic narrowing and irregularity, more evident in the PCA branches. 50% stenosis of the left P2 segment.  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   Electronically Signed   By: Nelson Chimes M.D.   On: 07/09/2015 20:11   Dg Chest 2 View 07/05/2015   CLINICAL DATA:  Nausea vomiting for 9 hours. Occasional cough. No chest pain. Coarse basilar breast sounds.  EXAM: CHEST  2 VIEW  COMPARISON:  11/23/2010  FINDINGS: Cardiac silhouette is normal  in size and configuration. Aorta is mildly tortuous. No mediastinal or hilar masses or evidence of adenopathy.  Mild lung base opacity is noted, similar to the prior chest radiographs, consistent with atelectasis. No convincing pneumonia no pulmonary edema. No pleural effusion or pneumothorax.  Bony thorax is demineralized but grossly intact.  IMPRESSION: 1. No acute cardiopulmonary disease. 2. Basilar atelectasis similar to the prior chest radiograph.   Electronically Signed   By: Lajean Manes M.D.   On: 07/05/2015 18:29   Mr Brain Wo Contrast 07/07/2015   CLINICAL DATA:  81 year old hypertensive male with vertigo. Subsequent encounter.  EXAM: MRI HEAD WITHOUT CONTRAST  TECHNIQUE: Multiplanar, multiecho pulse sequences of the brain and surrounding structures were obtained without intravenous contrast.  COMPARISON:  07/06/2015 head CT.  FINDINGS: Exam is motion degraded.  Large acute left cerebellar infarct involving majority of the mid to inferior aspect of the left cerebellum extending into the vermis and with minimal involvement posterior right cerebellum. Mass effect with narrowing of the fourth ventricle and fourth ventricular outflow placing the patient at risk for development of hydrocephalus. Currently no hydrocephalus.  Prominent small vessel disease type changes.  Mild global atrophy.  No intracranial mass lesion noted on this unenhanced exam.  Limited evaluation of intracranial vasculature secondary to motion degradation. The vertebral arteries, basilar artery and internal carotid arteries appear to be patent.  Polypoid opacification inferior maxillary sinuses with  mild mucosal thickening ethmoid sinus air cells.  Mild degenerative changes upper cervical spine. Cervical medullary junction, pituitary region pineal region unremarkable. Post right lens replacement without orbital abnormality identified.  IMPRESSION: Exam is motion degraded.  Large acute left cerebellar infarct involving majority of the mid to inferior aspect of the left cerebellum extending into the vermis and with minimal involvement posterior right cerebellum. Mass effect with narrowing of the fourth ventricle and fourth ventricular outflow placing the patient at risk for development of hydrocephalus. Currently no hydrocephalus. Close CT surveillance recommended.   Electronically Signed   By: Genia Del M.D.   On: 07/07/2015 14:01    12-lead ECG sinus rhythm, RBBB, LAFB, 1st degree AV block All prior EKG's in EPIC reviewed with no documented atrial fibrillation  Telemetry sinus rhythm  Assessment and Plan:  1. Cryptogenic stroke The patient presents with cryptogenic stroke.  The patient has a TEE planned for this AM.  I spoke at length with the patient about monitoring for afib with either a 30 day event monitor or an implantable loop recorder.  Risks, benefits, and alteratives to implantable loop recorder were discussed with the patient today.   At this time, the patient is very clear in their decision to proceed with implantable loop recorder.   Wound care was reviewed with the patient (keep incision clean and dry for 3 days).  Wound check scheduled for 07-21-15 at 10:30am.   Please call with questions.   Patsey Berthold, NP 07/10/2015 11:57 AM   I have seen and examined this patient with Chanetta Marshall.  Agree with above, note added to reflect my findings.  On exam, regular rhythm, no murmurs, lungs clear.  Patient with cryptogenic stroke, no cause found.  Plan to implant LINQ monitor for monitoring of potential atrial fibrillation.      Will M. Camnitz MD 07/10/2015 4:44 PM

## 2015-07-10 NOTE — Progress Notes (Signed)
Patient Demographics  Travis [name], is a 81 y.o. male, DOB - 07-Mar-1930, HKV:[id number]  Admit date - 07/05/2015   Admitting Physician Ivor Costa, MD  Outpatient Primary MD for the patient is Leonard Downing, MD  LOS - 5   Chief Complaint  Patient presents with  . Nausea  . Emesis         Subjective:   Travis [name] reported feeling better, but patient seems to downplay his symptom,  denies vertigo. Still some nausea, no vomitting  Assessment & Plan    Principal Problem:   Nausea & vomiting Active Problems:   Hypertension   Arthritis   Pre-diabetes   Rash   Sepsis   Cerebral thrombosis with cerebral infarction   Cerebellar infarct  Acute CVA  -  MRI brain showing large left cerebellar acute infarct, with some mass effect on fourth ventricle but no evidence of hydrocephalus  - Continue with  Neuro Checks every 4 hours for next 48 hours given mass effects on fourth ventricle - Patient on aspirin at home, was changed to Plavix by neurology  - glycohemoglobin A1c is 6.8 - Carotid Dopplers :1-39% internal carotid artery stenosis bilaterally. The left vertebral artery is patent with antegrade flow. Unable to visualize the right vertebral artery. - 2-D echo with EF 55%, unremarkable - ldl 76, hdl 37 - Continue with telemetry monitoring - patient been seen by PT and OT , recommend SNF, social worker notified. -neurology ordered CT angio head/neck, neurology recommend loop recorder to r/o embolic stroke, EP consulted  Nausea and vomiting - resolved , possibly related to his CVA versus gastroenteritis .  Vertigo - Initially thought to be secondary to BPV versus dehydration , but CT head with evidence of age-indeterminate infarct , so MRI brain was obtained and showing large left cerebellar acute infarct   Hypertension - Continue to hold atenolol and hydrochlorothiazide in the setting  of dehydration , continue to hold atenolol giving his bradycardia . - started on Norvasc, will discontinue to allow for permissive attention. -started low dose lisinopril for bp control, in the setting of prediabetes and cva.  Prediabetes - CBGs are acceptable - hemoglobin A1c 6.8    Code Status: Full  Family Communication: None at bedside  Disposition Plan: Pending further workup. Likely SNF   Procedures  None   Consults   Neurology EP   Medications  Scheduled Meds: . clopidogrel  75 mg Oral Daily  . finasteride  5 mg Oral Daily  . heparin  5,000 Units Subcutaneous 3 times per day  . insulin aspart  0-9 Units Subcutaneous TID WC  . lisinopril  5 mg Oral Daily  . senna-docusate  2 tablet Oral BID  . sodium chloride  3 mL Intravenous Q12H  . terazosin  10 mg Oral QHS   Continuous Infusions:   PRN Meds:.acetaminophen **OR** acetaminophen, hydrALAZINE, hydrOXYzine, iohexol, LORazepam, meclizine, morphine injection, ondansetron (ZOFRAN) IV  DVT Prophylaxis  - Heparin   Lab Results  Component Value Date   PLT 193 07/07/2015    Antibiotics    Anti-infectives    None          Objective:   Filed Vitals:   07/09/15 1636 07/09/15 2004 07/10/15 0441 07/10/15 0935  BP:  166/71 176/68 162/82 161/68  Pulse: 54 56 52 54  Temp: 98.1 F (36.7 C) 98 F (36.7 C) 97.8 F (36.6 C) 98.9 F (37.2 C)  TempSrc: Oral Oral Oral Oral  Resp: 17 16 17 18   Height:      Weight:  213 lb 6.4 oz (96.798 kg)    SpO2: 96% 98% 97% 98%    Wt Readings from Last 3 Encounters:  07/09/15 213 lb 6.4 oz (96.798 kg)  10/28/11 213 lb (96.616 kg)  10/04/11 212 lb (96.163 kg)     Intake/Output Summary (Last 24 hours) at 07/10/15 1218 Last data filed at 07/10/15 0900  Gross per 24 hour  Intake    720 ml  Output   2500 ml  Net  -1780 ml     Physical Exam  Awake Alert, Oriented Supple Neck,No JVD, No cervical lymphadenopathy appriciated.  Symmetrical Chest wall movement,  Good air movement bilaterally,  RRR,No Gallops,Rubs or new Murmurs, No Parasternal Heave +ve B.Sounds, Abd Soft, No tenderness, No organomegaly appriciated, No Cyanosis, Clubbing or edema, No new Rash or bruise     Data Review   Micro Results Recent Results (from the past 240 hour(s))  Culture, blood (x 2)     Status: None (Preliminary result)   Collection Time: 07/05/15  8:26 PM  Result Value Ref Range Status   Specimen Description BLOOD RIGHT ARM  Final   Special Requests   Final    BOTTLES DRAWN AEROBIC AND ANAEROBIC 5ML AER 1ML ANA   Culture NO GROWTH 4 DAYS  Final   Report Status PENDING  Incomplete  Culture, blood (x 2)     Status: None (Preliminary result)   Collection Time: 07/05/15  8:30 PM  Result Value Ref Range Status   Specimen Description BLOOD LEFT ARM  Final   Special Requests   Final    BOTTLES DRAWN AEROBIC AND ANAEROBIC 5ML AER 1ML ANA   Culture NO GROWTH 4 DAYS  Final   Report Status PENDING  Incomplete    Radiology Reports Ct Angio Head W/cm &/or Wo Cm  07/09/2015   CLINICAL DATA:  Acute left cerebellar infarction diagnosed 2 days ago.  EXAM: CT ANGIOGRAPHY HEAD AND NECK  TECHNIQUE: Multidetector CT imaging of the head and neck was performed using the standard protocol during bolus administration of intravenous contrast. Multiplanar CT image reconstructions and MIPs were obtained to evaluate the vascular anatomy. Carotid stenosis measurements (when applicable) are obtained utilizing NASCET criteria, using the distal internal carotid diameter as the denominator.  CONTRAST:  61mL OMNIPAQUE IOHEXOL 350 MG/ML SOLN  COMPARISON:  Head CT 07/06/2015.  MRI 07/07/2015.  FINDINGS: CT HEAD  Extensive low-density E remains affecting the inferior cerebellum on the left and to a lesser extent the right side of the cerebellum. There is swelling with mass effect upon the fourth ventricle but no evidence of ventricular obstruction at this moment, though that is a risk. No  hemorrhagic transformation. No new area of infarction is seen. Mild chronic small-vessel changes of the cerebral hemispheric white matter appear the same.  CTA NECK  Aortic arch: Atherosclerosis of the arch but no aneurysm or dissection. Branching pattern of the brachiocephalic vessels from the arch is normal.  Right carotid system: Common carotid artery is widely patent to the bifurcation. At the bifurcation, there is atherosclerotic disease with minimal diameter of the proximal internal carotid artery measuring 3.5 mm. Compared to a more distal cervical ICA diameter of 5 mm, this indicates a  30% stenosis. Remainder of the cervical internal carotid artery appears widely patent.  Left carotid system: Common carotid artery is widely patent to the bifurcation. There is soft plaque affecting the internal carotid artery bulb, but there is no narrowing of the ICA beyond the diameter of the 5 mm distal cervical internal carotid artery in therefore no stenosis.  Vertebral arteries:There is atherosclerotic disease at both vertebral artery origins. The left vertebral artery is dominant. Stenosis at the origin is no more than 30%. Beyond that, the vessel is widely patent through the cervical region. The nondominant right vertebral artery shows more severe stenosis at its origin, estimated at 70%. Beyond that, the vessel is widely patent through the cervical region  Skeleton: Curvature in degenerative disease of the cervical spine with pronounced facet arthropathy. No acute bone finding.  Other neck: No mass or lymphadenopathy. Benign appearing pleural and parenchymal scarring at both lung apices.  CTA HEAD  Anterior circulation: The internal carotid arteries are widely patent through the siphon regions. The right internal carotid artery supplies primarily at the right middle cerebral artery territory. There is tiny A1 segment on the right. No evidence of stenosis, aneurysm or vascular malformation. The left internal carotid  artery supplies the left middle cerebral artery territory and both anterior cerebral artery territories. 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 No stenosis.  Posterior circulation: Both vertebral arteries are widely patent to the basilar. No vertebral stenosis. Flow is present in both posterior inferior cerebellar arteries on today's study. The anterior inferior cerebellar arteries are patent. Superior cerebellar and posterior cerebral arteries are patent. No proximal stenosis. There is atherosclerotic narrowing of the more distal branch vessels including 50% stenosis of the P2 segment on the left.  Venous sinuses: Patent and normal  Anatomic variants: As above  Delayed phase: None significant  IMPRESSION: Atherosclerotic disease at both carotid bifurcations. 30% stenosis of the proximal right ICA. No stenosis of the left ICA.  Stenoses at both vertebral artery origins. 30% or less narrowing of the dominant left vertebral artery origin. Estimated 70% stenosis of the nondominant right vertebral artery origin.  The vertebral arteries beyond that are widely patent to the basilar. Major posterior circulation branch vessels show flow on the present study, including both posterior inferior cerebellar arteries, both anterior inferior cerebellar arteries, both superior cerebellar arteries and both posterior cerebral arteries.  Some distal vessel atherosclerotic narrowing and irregularity, more evident in the PCA branches. 50% stenosis of the left P2 segment.  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   Electronically Signed   By: Nelson Chimes M.D.   On: 07/09/2015 20:11   Dg Chest 2 View  07/05/2015   CLINICAL DATA:  Nausea vomiting for 9 hours. Occasional cough. No chest pain. Coarse basilar breast sounds.  EXAM: CHEST  2 VIEW  COMPARISON:  11/23/2010  FINDINGS: Cardiac silhouette is normal in size and configuration.  Aorta is mildly tortuous. No mediastinal or hilar masses or evidence of adenopathy.  Mild lung base opacity is noted, similar to the prior chest radiographs, consistent with atelectasis. No convincing pneumonia no pulmonary edema. No pleural effusion or pneumothorax.  Bony thorax is demineralized but grossly intact.  IMPRESSION: 1. No acute cardiopulmonary disease. 2. Basilar atelectasis similar to the prior chest radiograph.   Electronically Signed   By: Lajean Manes M.D.   On: 07/05/2015 18:29   Ct Head Wo Contrast  07/06/2015  CLINICAL DATA:  Vertigo  EXAM: CT HEAD WITHOUT CONTRAST  TECHNIQUE: Contiguous axial images were obtained from the base of the skull through the vertex without intravenous contrast.  COMPARISON:  None.  FINDINGS: No skull fracture is noted. No intracranial hemorrhage or midline shift. There is large area of decreased attenuation in the left cerebellum highly suspicious for ischemic of indeterminate age. Clinical correlation is necessary. Further correlation with MRI with diffusion imaging is recommended. Mild cerebral atrophy. Periventricular patchy subcortical white matter decreased attenuation probable due to chronic small vessel ischemic changes.  IMPRESSION: 1. There is area of decreased attenuation in left cerebellum highly suspicious for infarcts of indeterminate age. Subacute infarct cannot be excluded. Clinical correlation is necessary. Further correlation with MRI is recommended as clinically warranted. No intracranial hemorrhage or midline shift. Mild cerebral atrophy. Periventricular and patchy subcortical white matter decreased attenuation probable due to chronic small vessel ischemic changes.   Electronically Signed   By: Lahoma Crocker M.D.   On: 07/06/2015 15:51   Ct Angio Neck W/cm &/or Wo/cm  07/09/2015   CLINICAL DATA:  Acute left cerebellar infarction diagnosed 2 days ago.  EXAM: CT ANGIOGRAPHY HEAD AND NECK  TECHNIQUE: Multidetector CT imaging of the head and neck  was performed using the standard protocol during bolus administration of intravenous contrast. Multiplanar CT image reconstructions and MIPs were obtained to evaluate the vascular anatomy. Carotid stenosis measurements (when applicable) are obtained utilizing NASCET criteria, using the distal internal carotid diameter as the denominator.  CONTRAST:  67mL OMNIPAQUE IOHEXOL 350 MG/ML SOLN  COMPARISON:  Head CT 07/06/2015.  MRI 07/07/2015.  FINDINGS: CT HEAD  Extensive low-density E remains affecting the inferior cerebellum on the left and to a lesser extent the right side of the cerebellum. There is swelling with mass effect upon the fourth ventricle but no evidence of ventricular obstruction at this moment, though that is a risk. No hemorrhagic transformation. No new area of infarction is seen. Mild chronic small-vessel changes of the cerebral hemispheric white matter appear the same.  CTA NECK  Aortic arch: Atherosclerosis of the arch but no aneurysm or dissection. Branching pattern of the brachiocephalic vessels from the arch is normal.  Right carotid system: Common carotid artery is widely patent to the bifurcation. At the bifurcation, there is atherosclerotic disease with minimal diameter of the proximal internal carotid artery measuring 3.5 mm. Compared to a more distal cervical ICA diameter of 5 mm, this indicates a 30% stenosis. Remainder of the cervical internal carotid artery appears widely patent.  Left carotid system: Common carotid artery is widely patent to the bifurcation. There is soft plaque affecting the internal carotid artery bulb, but there is no narrowing of the ICA beyond the diameter of the 5 mm distal cervical internal carotid artery in therefore no stenosis.  Vertebral arteries:There is atherosclerotic disease at both vertebral artery origins. The left vertebral artery is dominant. Stenosis at the origin is no more than 30%. Beyond that, the vessel is widely patent through the cervical  region. The nondominant right vertebral artery shows more severe stenosis at its origin, estimated at 70%. Beyond that, the vessel is widely patent through the cervical region  Skeleton: Curvature in degenerative disease of the cervical spine with pronounced facet arthropathy. No acute bone finding.  Other neck: No mass or lymphadenopathy. Benign appearing pleural and parenchymal scarring at both lung apices.  CTA HEAD  Anterior circulation: The internal carotid arteries are widely patent through the siphon regions. The right internal  carotid artery supplies primarily at the right middle cerebral artery territory. There is tiny A1 segment on the right. No evidence of stenosis, aneurysm or vascular malformation. The left internal carotid artery supplies the left middle cerebral artery territory and both anterior cerebral artery territories. 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 No stenosis.  Posterior circulation: Both vertebral arteries are widely patent to the basilar. No vertebral stenosis. Flow is present in both posterior inferior cerebellar arteries on today's study. The anterior inferior cerebellar arteries are patent. Superior cerebellar and posterior cerebral arteries are patent. No proximal stenosis. There is atherosclerotic narrowing of the more distal branch vessels including 50% stenosis of the P2 segment on the left.  Venous sinuses: Patent and normal  Anatomic variants: As above  Delayed phase: None significant  IMPRESSION: Atherosclerotic disease at both carotid bifurcations. 30% stenosis of the proximal right ICA. No stenosis of the left ICA.  Stenoses at both vertebral artery origins. 30% or less narrowing of the dominant left vertebral artery origin. Estimated 70% stenosis of the nondominant right vertebral artery origin.  The vertebral arteries beyond that are widely patent to the basilar. Major posterior circulation branch vessels show flow on  the present study, including both posterior inferior cerebellar arteries, both anterior inferior cerebellar arteries, both superior cerebellar arteries and both posterior cerebral arteries.  Some distal vessel atherosclerotic narrowing and irregularity, more evident in the PCA branches. 50% stenosis of the left P2 segment.  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   Electronically Signed   By: Nelson Chimes M.D.   On: 07/09/2015 20:11   Mr Brain Wo Contrast  07/07/2015   CLINICAL DATA:  81 year old hypertensive male with vertigo. Subsequent encounter.  EXAM: MRI HEAD WITHOUT CONTRAST  TECHNIQUE: Multiplanar, multiecho pulse sequences of the brain and surrounding structures were obtained without intravenous contrast.  COMPARISON:  07/06/2015 head CT.  FINDINGS: Exam is motion degraded.  Large acute left cerebellar infarct involving majority of the mid to inferior aspect of the left cerebellum extending into the vermis and with minimal involvement posterior right cerebellum. Mass effect with narrowing of the fourth ventricle and fourth ventricular outflow placing the patient at risk for development of hydrocephalus. Currently no hydrocephalus.  Prominent small vessel disease type changes.  Mild global atrophy.  No intracranial mass lesion noted on this unenhanced exam.  Limited evaluation of intracranial vasculature secondary to motion degradation. The vertebral arteries, basilar artery and internal carotid arteries appear to be patent.  Polypoid opacification inferior maxillary sinuses with mild mucosal thickening ethmoid sinus air cells.  Mild degenerative changes upper cervical spine. Cervical medullary junction, pituitary region pineal region unremarkable. Post right lens replacement without orbital abnormality identified.  IMPRESSION: Exam is motion degraded.  Large acute left cerebellar infarct involving majority of the mid to inferior aspect of the left  cerebellum extending into the vermis and with minimal involvement posterior right cerebellum. Mass effect with narrowing of the fourth ventricle and fourth ventricular outflow placing the patient at risk for development of hydrocephalus. Currently no hydrocephalus. Close CT surveillance recommended.   Electronically Signed   By: Genia Del M.D.   On: 07/07/2015 14:01   US Abdomen Complete  07/06/2015   CLINICAL DATA:  Evaluate biliary dilatation seen on recent CT scan.  EXAM: ULTRASOUND ABDOMEN COMPLETE  COMPARISON:  CT scan 07/06/2015  FINDINGS: Gallbladder: Mobile echogenic foci in the gallbladder without acoustic shadowing. Possible small  gallstones. There are also areas of ring down artifact most consistent with adenomyomatosis. No pericholecystic fluid or sonographic Murphy sign to suggest acute cholecystitis.  Common bile duct: Diameter: 8.0 mm  Liver: Intrahepatic biliary dilatation not well demonstrated. No focal hepatic lesions. Normal echogenicity.  IVC: Normal caliber.  Pancreas: Sonographically unremarkable.  Spleen: Normal size and echogenicity without focal lesions.  Right Kidney: Length: 11.2 cm. Normal renal cortical thickness and echogenicity without focal lesions or hydronephrosis.  Left Kidney: Length: 12.2 cm. Normal renal cortical thickness and echogenicity without focal lesions or hydronephrosis.  Abdominal aorta: Normal caliber.  Other findings: No ascites.  IMPRESSION: 1. Probable small gallstones and changes of adenomyomatosis. No findings for acute cholecystitis. 2. Normal caliber common bile duct for age. No common bile duct stones. 3. Unremarkable sonographic appearance of the liver, pancreas, spleen and both kidneys.   Electronically Signed   By: Marijo Sanes M.D.   On: 07/06/2015 10:38   Ct Abdomen Pelvis W Contrast  07/06/2015   CLINICAL DATA:  New onset nausea and vomiting. Diffuse abdominal tenderness. Mansour out bowel obstruction.  EXAM: CT ABDOMEN AND PELVIS WITH CONTRAST   TECHNIQUE: Multidetector CT imaging of the abdomen and pelvis was performed using the standard protocol following bolus administration of intravenous contrast.  CONTRAST:  145mL OMNIPAQUE IOHEXOL 300 MG/ML  SOLN  COMPARISON:  01/07/2012  FINDINGS: Lower chest: Volume loss in both lower lobes with linear atelectasis.  Liver: Mild intrahepatic biliary ductal dilatation. Tiny 6 mm hypodensity in the subcapsular right hepatic lobe, too small to characterize.  Hepatobiliary: Prominent common bile duct measuring 9 mm at the porta hepatis. Gallbladder is physiologically distended. Small layering stones or sludge.  Pancreas: No pancreatic ductal dilatation or surrounding inflammation. Mildly atrophic.  Spleen: Normal in size without focal lesion. Trace perisplenic fluid.  Adrenal glands: No nodule.  Kidneys: Symmetric renal enhancement. No hydronephrosis. Small bilateral cortical cysts.  Stomach/Bowel: Small hiatal hernia. Stomach physiologically distended. There are no dilated or thickened small bowel loops. Small-moderate volume of stool throughout the colon without colonic wall thickening. Cecum is high-riding in the mid abdomen. The appendix is normal.  Vascular/Lymphatic: No retroperitoneal adenopathy. Abdominal aorta is normal in caliber. Atherosclerosis of the abdominal aorta without aneurysm.  Reproductive: Marked enlargement of the prostate gland, measuring at least 9.4 x 8.4 cm in by axial dimension. This causes mass effect on the bladder base.  Bladder: Distended.  No wall thickening.  Other: No free air, free fluid, or intra-abdominal fluid collection. Fat containing bilateral inguinal hernias, left larger than right. No pelvic adenopathy.  Musculoskeletal: There are no acute or suspicious osseous abnormalities.  IMPRESSION: 1. No bowel obstruction.  No signs of bowel inflammation. 2. Mild intrahepatic biliary ductal dilatation, this is new from prior exam. Common bile duct appears prominent measuring 9 mm. No  abnormal gallbladder distention. Significance is uncertain. If patient is able tolerate breath hold technique in there is clinical concern for hepatobiliary pathology, an MRCP could be considered. 3. Marked enlargement of the prostate gland, similar to prior exam.   Electronically Signed   By: Jeb Levering M.D.   On: 07/06/2015 01:12     CBC  Recent Labs Lab 07/05/15 1654 07/06/15 0539 07/07/15 0503  WBC 11.1* 8.3 8.0  HGB 13.8 12.4* 12.4*  HCT 41.6 37.6* 38.8*  PLT 195 213 193  MCV 90.4 90.8 92.2  MCH 30.0 30.0 29.5  MCHC 33.2 33.0 32.0  RDW 13.3 13.4 13.5  LYMPHSABS 0.4*  --   --  MONOABS 0.4  --   --   EOSABS 0.0  --   --   BASOSABS 0.0  --   --     Chemistries   Recent Labs Lab 07/05/15 1654 07/06/15 0539 07/07/15 0503  NA 134* 140 137  K 3.8 4.1 3.8  CL 99* 107 104  CO2 24 26 28   GLUCOSE 219* 125* 120*  BUN 17 11 11   CREATININE 1.16 1.08 1.17  CALCIUM 9.1 8.7* 8.7*  AST 27  --   --   ALT 34  --   --   ALKPHOS 89  --   --   BILITOT 0.5  --   --    ------------------------------------------------------------------------------------------------------------------ estimated creatinine clearance is 54.9 mL/min (by C-G formula based on Cr of 1.17). ------------------------------------------------------------------------------------------------------------------ No results for input(s): HGBA1C in the last 72 hours. ------------------------------------------------------------------------------------------------------------------  Recent Labs  07/09/15 0405  CHOL 138  HDL 37*  LDLCALC 76  TRIG 124  CHOLHDL 3.7   ------------------------------------------------------------------------------------------------------------------ No results for input(s): TSH, T4TOTAL, T3FREE, THYROIDAB in the last 72 hours.  Invalid input(s): FREET3 ------------------------------------------------------------------------------------------------------------------ No results  for input(s): VITAMINB12, FOLATE, FERRITIN, TIBC, IRON, RETICCTPCT in the last 72 hours.  Coagulation profile  Recent Labs Lab 07/05/15 2026  INR 0.98    No results for input(s): DDIMER in the last 72 hours.  Cardiac Enzymes  Recent Labs Lab 07/05/15 2026 07/06/15 0124 07/06/15 0700  TROPONINI <0.03 <0.03 0.03   ------------------------------------------------------------------------------------------------------------------ Invalid input(s): POCBNP     Time Spent in minutes   23 minutes   Xu,Fang MD PhD on 07/10/2015 at 12:18 PM  Between 7am to 7pm - Pager - [phone]  After 7pm go to [email or web address] - password [facility]  [facility]   Office  [phone]

## 2015-07-10 NOTE — Progress Notes (Signed)
Stroke Team Progress Note  HISTORY Travis [name]. is an 81 y.o. male who was on his porch on Saturday. Had acute onset nausea, vomiting and difficulty with gait. Initially patient did not want to go to the hospital but eventually was convinced. After admission with no improvement in symptoms patient was eventually convinced to have a MRI of the brain. Acute infarct diagnosed. Patient remains off balance and has some difficulty coordinating meals as well.  Wife reports patient had difficulty with balance for the past few weeks and has been experiencing some difficulty with memory.   Date last known well: Date: 07/05/2015 Time last known well: Time: 10:00 tPA Given: No: Outside time window . He was admitted to the neuro floor bed for further evaluation and treatment.  SUBJECTIVE No family members present. Dr. Leonie Man discussed possible TEE and loop with the patient. The patient is concerned about his gag reflex and does not want a TEE. We will try to have the loop scheduled today if possible.  OBJECTIVE Most recent Vital Signs: Filed Vitals:   07/09/15 1636 07/09/15 2004 07/10/15 0441 07/10/15 0935  BP: 166/71 176/68 162/82 161/68  Pulse: 54 56 52 54  Temp: 98.1 F (36.7 C) 98 F (36.7 C) 97.8 F (36.6 C) 98.9 F (37.2 C)  TempSrc: Oral Oral Oral Oral  Resp: 17 16 17 18   Height:      Weight:  96.798 kg (213 lb 6.4 oz)    SpO2: 96% 98% 97% 98%   CBG (last 3)   Recent Labs  07/09/15 2002 07/10/15 0806 07/10/15 1129  GLUCAP 118* 110* 129*    IV Fluid Intake:     MEDICATIONS  . clopidogrel  75 mg Oral Daily  . finasteride  5 mg Oral Daily  . heparin  5,000 Units Subcutaneous 3 times per day  . insulin aspart  0-9 Units Subcutaneous TID WC  . lisinopril  5 mg Oral Daily  . senna-docusate  2 tablet Oral BID  . sodium chloride  3 mL Intravenous Q12H  . terazosin  10 mg Oral QHS   PRN:  acetaminophen **OR** acetaminophen, hydrALAZINE, hydrOXYzine, iohexol,  LORazepam, meclizine, morphine injection, ondansetron (ZOFRAN) IV  Diet:  Diet heart healthy/carb modified Room service appropriate?: Yes; Fluid consistency:: Thin  liquids Activity:   Up with assistance  DVT Prophylaxis:  SQ heparin  CLINICALLY SIGNIFICANT STUDIES Basic Metabolic Panel:   Recent Labs Lab 07/06/15 0539 07/07/15 0503  NA 140 137  K 4.1 3.8  CL 107 104  CO2 26 28  GLUCOSE 125* 120*  BUN 11 11  CREATININE 1.08 1.17  CALCIUM 8.7* 8.7*   Liver Function Tests:   Recent Labs Lab 07/05/15 1654  AST 27  ALT 34  ALKPHOS 89  BILITOT 0.5  PROT 7.3  ALBUMIN 4.0   CBC:   Recent Labs Lab 07/05/15 1654 07/06/15 0539 07/07/15 0503  WBC 11.1* 8.3 8.0  NEUTROABS 10.2*  --   --   HGB 13.8 12.4* 12.4*  HCT 41.6 37.6* 38.8*  MCV 90.4 90.8 92.2  PLT 195 213 193   Coagulation:   Recent Labs Lab 07/05/15 2026  LABPROT 13.2  INR 0.98   Cardiac Enzymes:   Recent Labs Lab 07/05/15 2026 07/06/15 0124 07/06/15 0700  TROPONINI <0.03 <0.03 0.03   Urinalysis:   Recent Labs Lab 07/06/15 0727  COLORURINE YELLOW  LABSPEC 1.039*  PHURINE 6.0  GLUCOSEU NEGATIVE  HGBUR NEGATIVE  BILIRUBINUR NEGATIVE  KETONESUR NEGATIVE  PROTEINUR  NEGATIVE  UROBILINOGEN 0.2  NITRITE NEGATIVE  LEUKOCYTESUR NEGATIVE   Lipid Panel    Component Value Date/Time   CHOL 138 07/09/2015 0405   TRIG 124 07/09/2015 0405   HDL 37* 07/09/2015 0405   CHOLHDL 3.7 07/09/2015 0405   VLDL 25 07/09/2015 0405   LDLCALC 76 07/09/2015 0405   HgbA1C  Lab Results  Component Value Date   HGBA1C 6.8* 07/06/2015    Urine Drug Screen:  No results found for: LABOPIA, COCAINSCRNUR, LABBENZ, AMPHETMU, THCU, LABBARB  Alcohol Level: No results for input(s): ETH in the last 168 hours.   Ct Angio Head and Neck W/cm &/or Wo Cm 07/09/2015    Atherosclerotic disease at both carotid bifurcations. 30% stenosis of the proximal right ICA. No stenosis of the left ICA.  Stenoses at both vertebral  artery origins. 30% or less narrowing of the dominant left vertebral artery origin. Estimated 70% stenosis of the nondominant right vertebral artery origin.  The vertebral arteries beyond that are widely patent to the basilar. Major posterior circulation branch vessels show flow on the present study, including both posterior inferior cerebellar arteries, both anterior inferior cerebellar arteries, both superior cerebellar arteries and both posterior cerebral arteries.  Some distal vessel atherosclerotic narrowing and irregularity, more evident in the PCA branches. 50% stenosis of the left P2 segment.  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       Carotid Doppler 1-39% internal carotid artery stenosis bilaterally. The left vertebral artery is patent with antegrade flow. Unable to visualize the right vertebral artery   2D Echocardiogram  Left ventricle: The cavity size was normal. Systolic function was normal. The estimated ejection fraction was in the range of 55% to 60%. Wall motion was normal; there were no regional wall motion abnormalities.   CXR  No acute cardiopulmonary disease.  Basilar atelectasis similar to the prior chest radiograph.   EKG   Sinus rhythm Prolonged PR interval    Therapy Recommendations - Home health physical therapy. (The patient declined skilled nursing facility placement)    Physical Exam   Elderly male currently not in distress. . Afebrile. Head is nontraumatic. Neck is supple without bruit.    Cardiac exam no murmur or gallop. Lungs are clear to auscultation. Distal pulses are well felt. Neurological Exam :   Awake  Alert oriented x 3. Normal speech and language.eye movements full without nystagmus.fundi were not visualized. Vision acuity and fields appear normal. Hearing is normal. Palatal movements are normal. Face symmetric. Tongue midline. Normal strength, tone, reflexes and coordination.  Normal sensation. Gait deferred.   ASSESSMENT [name]. is a 81 y.o. male presenting with  nausea, vomiting, gait ataxia from left cerebellar infarct etiology to be determined. Carotid Dopplers suggest right vertebral artery occlusion. Need further evaluation with CT angiogram Infarct felt to be thrombotic*   On aspirin 325 mg orally every day prior to admission. Now on clopidogrel 75 mg orally every day for secondary stroke prevention. Patient with resultant  Gait ataxia. Stroke work up underway.   LDL 76  HbA1c 6.8  CT angiogram - as noted above   Hospital day # 5  TREATMENT/PLAN  Loop recorder placement - possibly today ( patient declined TEE )  Home health physical therapy ( patient declined skilled nursing facility placement )  Continue Plavix  Okay for discharge once loop has been placed  Follow-up Dr. Leonie Man in 2 months  Outpatient follow-up anterior communicating artery  aneurysm.   Mikey Bussing PA-C [facility] Pager [phone] 07/10/2015, 1:09 PM I have personally examined this patient, reviewed notes, independently viewed imaging studies, participated in medical decision making and plan of care. I have made any additions or clarifications directly to the above note. Agree with note above. Plan 1 recorder placement today and then discharged to his . Stroke service will sign off. Kindly follow-up with Dr. Leonie Man as an outpatient in 2 months or call earlier if necessary  Antony Contras, [place] Pager: [phone] 07/10/2015 2:35 PM     To contact Stroke Continuity provider, please refer to [email or web address] After hours, contact General Neurology

## 2015-07-11 ENCOUNTER — Encounter (HOSPITAL_COMMUNITY): Payer: Self-pay | Admitting: Cardiology

## 2015-07-11 DIAGNOSIS — E785 Hyperlipidemia, unspecified: Secondary | ICD-10-CM

## 2015-07-11 DIAGNOSIS — R627 Adult failure to thrive: Secondary | ICD-10-CM

## 2015-07-11 LAB — MAGNESIUM: MAGNESIUM: 2 mg/dL (ref 1.7–2.4)

## 2015-07-11 LAB — BASIC METABOLIC PANEL
ANION GAP: 3 — AB (ref 5–15)
BUN: 8 mg/dL (ref 6–20)
CALCIUM: 8.7 mg/dL — AB (ref 8.9–10.3)
CO2: 30 mmol/L (ref 22–32)
Chloride: 104 mmol/L (ref 101–111)
Creatinine, Ser: 1.09 mg/dL (ref 0.61–1.24)
Glucose, Bld: 106 mg/dL — ABNORMAL HIGH (ref 65–99)
POTASSIUM: 3.3 mmol/L — AB (ref 3.5–5.1)
Sodium: 137 mmol/L (ref 135–145)

## 2015-07-11 LAB — GLUCOSE, CAPILLARY
GLUCOSE-CAPILLARY: 93 mg/dL (ref 65–99)
Glucose-Capillary: 99 mg/dL (ref 65–99)

## 2015-07-11 MED ORDER — LISINOPRIL 10 MG PO TABS: 10.0000 mg | ORAL_TABLET | Freq: Every day | ORAL | Status: DC

## 2015-07-11 MED ORDER — CLOPIDOGREL BISULFATE 75 MG PO TABS: 75.0000 mg | ORAL_TABLET | Freq: Every day | ORAL | Status: DC

## 2015-07-11 MED ORDER — LISINOPRIL 10 MG PO TABS
10.0000 mg | ORAL_TABLET | Freq: Every day | ORAL | Status: DC
Start: 2015-07-12 — End: 2015-07-11

## 2015-07-11 MED ORDER — MECLIZINE HCL 25 MG PO TABS: 25.0000 mg | ORAL_TABLET | Freq: Three times a day (TID) | ORAL | Status: DC | PRN

## 2015-07-11 MED ORDER — POTASSIUM CHLORIDE CRYS ER 20 MEQ PO TBCR
40.0000 meq | EXTENDED_RELEASE_TABLET | Freq: Once | ORAL | Status: AC
  Administered 2015-07-11: 40 meq via ORAL
  Filled 2015-07-11: qty 2

## 2015-07-11 MED ORDER — METFORMIN HCL 500 MG PO TABS: 500.0000 mg | ORAL_TABLET | Freq: Two times a day (BID) | ORAL | Status: AC

## 2015-07-11 MED ORDER — METFORMIN HCL 500 MG PO TABS: 500.0000 mg | ORAL_TABLET | Freq: Two times a day (BID) | ORAL | Status: DC

## 2015-07-11 NOTE — Progress Notes (Signed)
Physical Therapy Treatment Patient Details Name: Travis [name]. MRN: [id number] DOB: 16-Aug-1930 Today's Date: 07/11/2015    History of Present Illness  81 y.o. male with PMH of prediabetes, hypertension, arthritis, elevated PSA, who presents with nausea, vomiting. Reports nausea typically occurs with moving/changing positions, increased symptoms of nausea with brief gaze stabilization assessment. MRI (+) Large acute left cerebellar infarct involving majority of the mid to inferior aspect of the left cerebellum extending into the vermis and with minimal involvement posterior right cerebellum. Mass effect with narrowing of the fourth ventricle and fourth ventricular outflow placing the patient at risk for development of hydrocephalus    PT Comments    Patient making progress with mobility and gait.  However continues to demonstrate decreased balance during gait, requiring mod assist at times.  Agree with need for SNF at discharge for continued therapy.  Follow Up Recommendations  SNF;Supervision/Assistance - 24 hour     Equipment Recommendations  Rolling walker with 5" wheels    Recommendations for Other Services       Precautions / Restrictions Precautions Precautions: Fall Restrictions Weight Bearing Restrictions: No    Mobility  Bed Mobility Overal bed mobility: Needs Assistance Bed Mobility: Supine to Sit;Sit to Supine     Supine to sit: Supervision Sit to supine: Supervision   General bed mobility comments: Supervision for safety only.  Transfers Overall transfer level: Needs assistance Equipment used: Rolling walker (2 wheeled) Transfers: Sit to/from Stand Sit to Stand: Min assist         General transfer comment: Verbal cues for hand placement.  Assist to steady during transfer.  Ambulation/Gait Ambulation/Gait assistance: Min assist;Mod assist Ambulation Distance (Feet): 400 Feet Assistive device: Rolling walker (2 wheeled) Gait Pattern/deviations:  Step-through pattern;Decreased stride length;Ataxic;Scissoring;Staggering left;Staggering right;Narrow base of support Gait velocity: decreased Gait velocity interpretation: Below normal speed for age/gender General Gait Details: Verbal cues for safe use of RW.  Patient with narrow gait - cues to try to separate feet with each step.  Patient continues to lose balance to both sides, especially during turns.   Stairs            Wheelchair Mobility    Modified Rankin (Stroke Patients Only)       Balance           Standing balance support: Bilateral upper extremity supported Standing balance-Leahy Scale: Poor                      Cognition Arousal/Alertness: Awake/alert Behavior During Therapy: Impulsive Overall Cognitive Status: Impaired/Different from baseline Area of Impairment: Awareness;Safety/judgement         Safety/Judgement: Decreased awareness of deficits;Decreased awareness of safety          Exercises      General Comments        Pertinent Vitals/Pain Pain Assessment: No/denies pain    Home Living                      Prior Function            PT Goals (current goals can now be found in the care plan section) Progress towards PT goals: Progressing toward goals    Frequency  Min 4X/week    PT Plan Current plan remains appropriate    Co-evaluation             End of Session Equipment Utilized During Treatment: Gait belt Activity Tolerance: Patient tolerated treatment well Patient left:  in bed;with call bell/phone within reach;with family/visitor present     Time: 1347-1401 PT Time Calculation (min) (ACUTE ONLY): 14 min  Charges:  $Gait Training: 8-22 mins                    G Codes:      Despina Pole 2015-08-06, 2:32 PM Carita Pian. Sanjuana Kava, [place] Pager [phone]

## 2015-07-11 NOTE — Progress Notes (Signed)
Report given to charge nurse at [facility] pt will be discharging to.  Carole Civil, RN

## 2015-07-11 NOTE — Care Management Important Message (Signed)
Important Message  Patient Details  Name: Travis [name]. MRN: [id number] Date of Birth: 15-Jun-1930   Medicare Important Message Given:  Yes-third notification given    Loann Quill 07/11/2015, 11:18 AM

## 2015-07-11 NOTE — Clinical Social Work Placement (Signed)
   CLINICAL SOCIAL WORK PLACEMENT  NOTE  Date:  07/11/2015  Patient Details  Name: Travis [name]. MRN: [id number] Date of Birth: Nov 19, 1929  Clinical Social Work is seeking post-discharge placement for this patient at the [place] level of care (*CSW will initial, date and re-position this form in  chart as items are completed):  No (Patient had made decision on facility)   Patient/family provided with [facility] Work Department's list of facilities offering this level of care within the geographic area requested by the patient (or if unable, by the patient's family).  Yes   Patient/family informed of their freedom to choose among providers that offer the needed level of care, that participate in Medicare, Medicaid or managed care program needed by the patient, have an available bed and are willing to accept the patient.  No   Patient/family informed of [facility]'s ownership interest in [facility] and [facility], as well as of the fact that they are under no obligation to receive care at these facilities.  PASRR submitted to EDS on 07/11/15     PASRR number received on 07/11/15     Existing PASRR number confirmed on       FL2 transmitted to all facilities in geographic area requested by pt/family on 07/11/15 (Clinical information sent to [facility] only. )     FL2 transmitted to all facilities within larger geographic area on       Patient informed that his/her managed care company has contracts with or will negotiate with certain facilities, including the following:        Yes (Patient informed that CSW contacted [facility] and clinical information forwarded to them.)   Patient/family informed of bed offers received.  Patient chooses bed at [place], [place]     Physician recommends and patient chooses bed at      Patient to be transferred to   on 07/11/15.  Patient to be transferred to facility by Family     Patient family  notified on 07/11/15 of transfer.  Name of family member notified:  Wife Joaquim Lai [place] at the bedside     PHYSICIAN       Additional Comment:    _______________________________________________ Sable Feil, LCSW 07/11/2015, 2:06 PM

## 2015-07-11 NOTE — Clinical Social Work Note (Signed)
Clinical Social Work Assessment  Patient Details  Name: Travis [name]. MRN: [id number] Date of Birth: [birth year]  Date of referral:  07/11/15               Reason for consult:  Facility Placement                Permission sought to share information with:  Family Supports (Wife, Travis [name]) Permission granted to share information::  Yes, Verbal Permission Granted  Name::     Travis [name]  Agency::     Relationship::  Spouse  Contact Information:  [phone]  Housing/Transportation Living arrangements for the past 2 months:  [place] of Information:  Patient Patient Interpreter Needed:  None Criminal Activity/Legal Involvement Pertinent to Current Situation/Hospitalization:  No - Comment as needed Significant Relationships:  Adult Children, Spouse Lives with:  Spouse Do you feel safe going back to the place where you live?  Yes Need for family participation in patient care:  Yes (Comment)  Care giving concerns:  None expressed by patient or wife.   Social Worker assessment / plan:  CSW talked with patient regarding discharge planning and recommendation of ST rehab. Travis [name] in agreement and informed CSW that he knows the [facility] and they have a bed in a semi-private room for him. He added that they will move him to a private room when it becomes available. CSW talked later with patient's wife and daughter who were at the bedside and they were pleased that patient would be discharging to [facility]. The family will provide transport.  Employment status:  Retired Nurse, adult PT Recommendations:  Christmas / Referral to community resources:  [place]  Patient/Family's Response to care:  No concerns expressed  Patient/Family's Understanding of and Emotional Response to Diagnosis, Current Treatment, and Prognosis:  Not discussed  Emotional Assessment Appearance:  Appears stated  age Attitude/Demeanor/Rapport:  Other (Appropriate) Affect (typically observed):  Appropriate, Pleasant Orientation:  Oriented to Self, Oriented to Place, Oriented to  Time, Oriented to Situation Alcohol / Substance use:  Tobacco Use, Alcohol Use (Patient reports that he quit smoking and does drink alcohol, but does not use illicit drugs.) Psych involvement (Current and /or in the community):  No (Comment)  Discharge Needs  Concerns to be addressed:  Discharge Planning Concerns Readmission within the last 30 days:  No Current discharge risk:  None Barriers to Discharge:  No Barriers Identified   Sable Feil, LCSW 07/11/2015, 2:01 PM

## 2015-07-11 NOTE — Discharge Summary (Signed)
Discharge Summary  Travis [name]. OZH:[id number] DOB: 1930-01-21  PCP: Leonard Downing, MD  Admit date: 07/05/2015 Discharge date: 07/11/2015  Time spent: >28mins  Recommendations for Outpatient Follow-up:  1. F/u with PMD within a week for hospital discharge follow up, pmd to continue titrate blood pressure med, started lisinopril, hctz stopped due to poor oral intake. 2. F/u with neurology 3. F/u with cardiology (EP)  For wound check and loop recorder monitor 4. Repeat CT head without contrast in a week to monitor sign of hydrocephalus ( no hydrocephalus on last imaging prior to discharge)  Discharge Diagnoses:  Active Hospital Problems   Diagnosis Date Noted  . Nausea & vomiting 07/05/2015  . Cerebellar infarct   . Cerebral thrombosis with cerebral infarction 07/08/2015  . Rash 07/05/2015  . Sepsis 07/05/2015  . Pre-diabetes   . Hypertension   . Arthritis     Resolved Hospital Problems   Diagnosis Date Noted Date Resolved  No resolved problems to display.    Discharge Condition: stable  Diet recommendation: heart healthy/carb modified  Filed Weights   07/07/15 2026 07/09/15 2004 07/10/15 2021  Weight: 211 lb 4.8 oz (95.845 kg) 213 lb 6.4 oz (96.798 kg) 212 lb 9.6 oz (96.435 kg)    History of present illness:  Travis [name]. is a 81 y.o. male with PMH of prediabetes, hypertension, arthritis, elevated PSA, who presents with nausea, vomiting.  Patient reports that he started having sudden onset nausea and vomiting at about 9:30 AM. He does not have abdominal pain or diarrhea. He vomited more than 10 times with yellow colored materials, without blood in the vomitus. Last bowel movement was in the morning. He reports that 2 days ago, he had several episodes of diarrhea, which have resolved spontaneously. Patient reports that he has chronic mild dysuria and burning on urination, which have not changed. He does not have chest pain, shortness of breath, fever or  chills. He has mild dry cough, which is chronic issue and has not changed. Patient reports that he developed few papular rashes over his front upper chest and left neck after he received Benadryl in the emergency room.  In ED, patient was found to have negative troponin, lipase 24, WBC 11.1, tachycardia, tachypnea, temperature normal, electrolytes okay, negative chest x-ray, blood sugar 219 by BMP. Patient's admitted to inpatient for further evaluation treatment.  Hospital Course:  Principal Problem:   Nausea & vomiting Active Problems:   Hypertension   Arthritis   Pre-diabetes   Rash   Sepsis   Cerebral thrombosis with cerebral infarction   Cerebellar infarct  Acute CVA  - MRI brain showing large left cerebellar acute infarct, with some mass effect on fourth ventricle but no evidence of hydrocephalus, need to repeat CT head without contrast in a week after discharge  - Patient on aspirin at home, was changed to Plavix by neurology  - glycohemoglobin A1c is 6.8 - Carotid Dopplers :1-39% internal carotid artery stenosis bilaterally. The left vertebral artery is patent with antegrade flow. Unable to visualize the right vertebral artery. -CTA headneck:  Atherosclerotic disease at both carotid bifurcations. 30% stenosis of the proximal right ICA. No stenosis of the left ICA. Stenoses at both vertebral artery origins. 30% or less narrowing of the dominant left vertebral artery origin. Estimated 70% stenosis of the nondominant right vertebral artery origin. The vertebral arteries beyond that are widely patent to the basilar. Major posterior circulation branch vessels show flow on the present study, including both  posterior inferior cerebellar arteries, both anterior inferior cerebellar arteries, both superior cerebellar arteries and both posterior cerebral arteries. Some distal vessel atherosclerotic narrowing and irregularity, more evident in the PCA branches. 50% stenosis of the left P2  segment. 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   - 2-D echo with EF 55%, unremarkable, patient declined TEE. - ldl 76, hdl 37 - patient been seen by PT and OT , recommend SNF, patient agreed with SNF. - neurology recommend loop recorder to r/o embolic stroke, EP consulted, s/p loop recorder on 9/22.  Nausea and vomiting - resolved , possibly related to his CVA versus gastroenteritis .  Vertigo - Initially thought to be secondary to BPV versus dehydration , but CT head with evidence of age-indeterminate infarct , so MRI brain was obtained and showing large left cerebellar acute infarct   Hypertension - Continue to hold atenolol and hydrochlorothiazide in the setting of dehydration , continue to hold atenolol giving his bradycardia . - started on Norvasc, will discontinue to allow for permissive attention. -started  lisinopril 10mg  po qd for bp control, in the setting of prediabetes and cva, pmd to continue titrate bp meds. hctz stopped due to poor oral intake.  Prediabetes - CBGs are acceptable - hemoglobin A1c 6.8  -diet modification, started low dose metformin, pmd to continue monitor blood sugar control.   Code Status: Full  Family Communication: None at bedside  Disposition Plan:  SNF   Procedures  Loop recorder implantation on 9/22  Consults  Neurology EP   Discharge Exam: BP 175/76 mmHg  Pulse 54  Temp(Src) 98.4 F (36.9 C) (Oral)  Resp 18  Ht 5\' 9"  (1.753 m)  Wt 212 lb 9.6 oz (96.435 kg)  BMI 31.38 kg/m2  SpO2 98%  Awake Alert, Oriented Supple Neck,No JVD, No cervical lymphadenopathy appriciated.  Symmetrical Chest wall movement, Good air movement bilaterally,  RRR,No Gallops,Rubs or new Murmurs, No Parasternal Heave +ve B.Sounds, Abd Soft, No tenderness, No organomegaly appriciated, No Cyanosis, Clubbing or edema, No new Rash or bruise   Discharge Instructions You were cared for by a  hospitalist during your hospital stay. If you have any questions about your discharge medications or the care you received while you were in the hospital after you are discharged, you can call the unit and asked to speak with the hospitalist on call if the hospitalist that took care of you is not available. Once you are discharged, your primary care physician will handle any further medical issues. Please note that NO REFILLS for any discharge medications will be authorized once you are discharged, as it is imperative that you return to your primary care physician (or establish a relationship with a primary care physician if you do not have one) for your aftercare needs so that they can reassess your need for medications and monitor your lab values.      Discharge Instructions    Ambulatory referral to Neurology    Complete by:  As directed   Dr. Leonie Man requests follow up for this patient in 2 months.     Diet - low sodium heart healthy    Complete by:  As directed   Carb modified diet     Increase activity slowly    Complete by:  As directed             Medication List    STOP taking these medications        aspirin 325  MG EC tablet     hydrochlorothiazide 50 MG tablet  Commonly known as:  HYDRODIURIL      TAKE these medications        clopidogrel 75 MG tablet  Commonly known as:  PLAVIX  Take 1 tablet (75 mg total) by mouth daily.     finasteride 5 MG tablet  Commonly known as:  PROSCAR  Take 5 mg by mouth daily.     lisinopril 10 MG tablet  Commonly known as:  PRINIVIL,ZESTRIL  Take 1 tablet (10 mg total) by mouth daily.  Start taking on:  07/12/2015     meclizine 25 MG tablet  Commonly known as:  ANTIVERT  Take 1 tablet (25 mg total) by mouth 3 (three) times daily as needed for dizziness.     metFORMIN 500 MG tablet  Commonly known as:  GLUCOPHAGE  Take 1 tablet (500 mg total) by mouth 2 (two) times daily with a meal.     REFRESH OP  Place 1 drop into the right  eye daily as needed. For dry eye per patient     terazosin 10 MG capsule  Commonly known as:  HYTRIN  Take 10 mg by mouth at bedtime.       No Known Allergies Follow-up Information    Follow up with SETHI,PRAMOD, MD. Schedule an appointment as soon as possible for a visit in 2 months.   Specialties:  Neurology, Radiology   Contact information:   [address] [place] [address] [phone]       Follow up with [facility] On 07/21/2015.   Why:  at 10:30am for wound check   Contact information:   [place] [address] [place] [address]       Follow up with Leonard Downing, MD In 2 weeks.   Specialty:  Family Medicine   Why:  hospital discharge follow up   Contact information:   [place]  [address] [phone]        The results of significant diagnostics from this hospitalization (including imaging, microbiology, ancillary and laboratory) are listed below for reference.    Significant Diagnostic Studies: Ct Angio Head W/cm &/or Wo Cm  07/09/2015   CLINICAL DATA:  Acute left cerebellar infarction diagnosed 2 days ago.  EXAM: CT ANGIOGRAPHY HEAD AND NECK  TECHNIQUE: Multidetector CT imaging of the head and neck was performed using the standard protocol during bolus administration of intravenous contrast. Multiplanar CT image reconstructions and MIPs were obtained to evaluate the vascular anatomy. Carotid stenosis measurements (when applicable) are obtained utilizing NASCET criteria, using the distal internal carotid diameter as the denominator.  CONTRAST:  12mL OMNIPAQUE IOHEXOL 350 MG/ML SOLN  COMPARISON:  Head CT 07/06/2015.  MRI 07/07/2015.  FINDINGS: CT HEAD  Extensive low-density E remains affecting the inferior cerebellum on the left and to a lesser extent the right side of the cerebellum. There is swelling with mass effect upon the fourth ventricle but no evidence of ventricular obstruction at this  moment, though that is a risk. No hemorrhagic transformation. No new area of infarction is seen. Mild chronic small-vessel changes of the cerebral hemispheric white matter appear the same.  CTA NECK  Aortic arch: Atherosclerosis of the arch but no aneurysm or dissection. Branching pattern of the brachiocephalic vessels from the arch is normal.  Right carotid system: Common carotid artery is widely patent to the bifurcation. At the bifurcation, there is atherosclerotic disease with minimal  diameter of the proximal internal carotid artery measuring 3.5 mm. Compared to a more distal cervical ICA diameter of 5 mm, this indicates a 30% stenosis. Remainder of the cervical internal carotid artery appears widely patent.  Left carotid system: Common carotid artery is widely patent to the bifurcation. There is soft plaque affecting the internal carotid artery bulb, but there is no narrowing of the ICA beyond the diameter of the 5 mm distal cervical internal carotid artery in therefore no stenosis.  Vertebral arteries:There is atherosclerotic disease at both vertebral artery origins. The left vertebral artery is dominant. Stenosis at the origin is no more than 30%. Beyond that, the vessel is widely patent through the cervical region. The nondominant right vertebral artery shows more severe stenosis at its origin, estimated at 70%. Beyond that, the vessel is widely patent through the cervical region  Skeleton: Curvature in degenerative disease of the cervical spine with pronounced facet arthropathy. No acute bone finding.  Other neck: No mass or lymphadenopathy. Benign appearing pleural and parenchymal scarring at both lung apices.  CTA HEAD  Anterior circulation: The internal carotid arteries are widely patent through the siphon regions. The right internal carotid artery supplies primarily at the right middle cerebral artery territory. There is tiny A1 segment on the right. No evidence of stenosis, aneurysm or vascular  malformation. The left internal carotid artery supplies the left middle cerebral artery territory and both anterior cerebral artery territories. 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 No stenosis.  Posterior circulation: Both vertebral arteries are widely patent to the basilar. No vertebral stenosis. Flow is present in both posterior inferior cerebellar arteries on today's study. The anterior inferior cerebellar arteries are patent. Superior cerebellar and posterior cerebral arteries are patent. No proximal stenosis. There is atherosclerotic narrowing of the more distal branch vessels including 50% stenosis of the P2 segment on the left.  Venous sinuses: Patent and normal  Anatomic variants: As above  Delayed phase: None significant  IMPRESSION: Atherosclerotic disease at both carotid bifurcations. 30% stenosis of the proximal right ICA. No stenosis of the left ICA.  Stenoses at both vertebral artery origins. 30% or less narrowing of the dominant left vertebral artery origin. Estimated 70% stenosis of the nondominant right vertebral artery origin.  The vertebral arteries beyond that are widely patent to the basilar. Major posterior circulation branch vessels show flow on the present study, including both posterior inferior cerebellar arteries, both anterior inferior cerebellar arteries, both superior cerebellar arteries and both posterior cerebral arteries.  Some distal vessel atherosclerotic narrowing and irregularity, more evident in the PCA branches. 50% stenosis of the left P2 segment.  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   Electronically Signed   By: Nelson Chimes M.D.   On: 07/09/2015 20:11   Dg Chest 2 View  07/05/2015   CLINICAL DATA:  Nausea vomiting for 9 hours. Occasional cough. No chest pain. Coarse basilar breast sounds.  EXAM: CHEST  2 VIEW  COMPARISON:  11/23/2010  FINDINGS: Cardiac  silhouette is normal in size and configuration. Aorta is mildly tortuous. No mediastinal or hilar masses or evidence of adenopathy.  Mild lung base opacity is noted, similar to the prior chest radiographs, consistent with atelectasis. No convincing pneumonia no pulmonary edema. No pleural effusion or pneumothorax.  Bony thorax is demineralized but grossly intact.  IMPRESSION: 1. No acute cardiopulmonary disease. 2. Basilar atelectasis similar to the prior chest radiograph.  Electronically Signed   By: Lajean Manes M.D.   On: 07/05/2015 18:29   Ct Head Wo Contrast  07/06/2015   CLINICAL DATA:  Vertigo  EXAM: CT HEAD WITHOUT CONTRAST  TECHNIQUE: Contiguous axial images were obtained from the base of the skull through the vertex without intravenous contrast.  COMPARISON:  None.  FINDINGS: No skull fracture is noted. No intracranial hemorrhage or midline shift. There is large area of decreased attenuation in the left cerebellum highly suspicious for ischemic of indeterminate age. Clinical correlation is necessary. Further correlation with MRI with diffusion imaging is recommended. Mild cerebral atrophy. Periventricular patchy subcortical white matter decreased attenuation probable due to chronic small vessel ischemic changes.  IMPRESSION: 1. There is area of decreased attenuation in left cerebellum highly suspicious for infarcts of indeterminate age. Subacute infarct cannot be excluded. Clinical correlation is necessary. Further correlation with MRI is recommended as clinically warranted. No intracranial hemorrhage or midline shift. Mild cerebral atrophy. Periventricular and patchy subcortical white matter decreased attenuation probable due to chronic small vessel ischemic changes.   Electronically Signed   By: Lahoma Crocker M.D.   On: 07/06/2015 15:51   Ct Angio Neck W/cm &/or Wo/cm  07/09/2015   CLINICAL DATA:  Acute left cerebellar infarction diagnosed 2 days ago.  EXAM: CT ANGIOGRAPHY HEAD AND NECK  TECHNIQUE:  Multidetector CT imaging of the head and neck was performed using the standard protocol during bolus administration of intravenous contrast. Multiplanar CT image reconstructions and MIPs were obtained to evaluate the vascular anatomy. Carotid stenosis measurements (when applicable) are obtained utilizing NASCET criteria, using the distal internal carotid diameter as the denominator.  CONTRAST:  57mL OMNIPAQUE IOHEXOL 350 MG/ML SOLN  COMPARISON:  Head CT 07/06/2015.  MRI 07/07/2015.  FINDINGS: CT HEAD  Extensive low-density E remains affecting the inferior cerebellum on the left and to a lesser extent the right side of the cerebellum. There is swelling with mass effect upon the fourth ventricle but no evidence of ventricular obstruction at this moment, though that is a risk. No hemorrhagic transformation. No new area of infarction is seen. Mild chronic small-vessel changes of the cerebral hemispheric white matter appear the same.  CTA NECK  Aortic arch: Atherosclerosis of the arch but no aneurysm or dissection. Branching pattern of the brachiocephalic vessels from the arch is normal.  Right carotid system: Common carotid artery is widely patent to the bifurcation. At the bifurcation, there is atherosclerotic disease with minimal diameter of the proximal internal carotid artery measuring 3.5 mm. Compared to a more distal cervical ICA diameter of 5 mm, this indicates a 30% stenosis. Remainder of the cervical internal carotid artery appears widely patent.  Left carotid system: Common carotid artery is widely patent to the bifurcation. There is soft plaque affecting the internal carotid artery bulb, but there is no narrowing of the ICA beyond the diameter of the 5 mm distal cervical internal carotid artery in therefore no stenosis.  Vertebral arteries:There is atherosclerotic disease at both vertebral artery origins. The left vertebral artery is dominant. Stenosis at the origin is no more than 30%. Beyond that, the  vessel is widely patent through the cervical region. The nondominant right vertebral artery shows more severe stenosis at its origin, estimated at 70%. Beyond that, the vessel is widely patent through the cervical region  Skeleton: Curvature in degenerative disease of the cervical spine with pronounced facet arthropathy. No acute bone finding.  Other neck: No mass or lymphadenopathy. Benign appearing pleural and parenchymal scarring  at both lung apices.  CTA HEAD  Anterior circulation: The internal carotid arteries are widely patent through the siphon regions. The right internal carotid artery supplies primarily at the right middle cerebral artery territory. There is tiny A1 segment on the right. No evidence of stenosis, aneurysm or vascular malformation. The left internal carotid artery supplies the left middle cerebral artery territory and both anterior cerebral artery territories. 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 No stenosis.  Posterior circulation: Both vertebral arteries are widely patent to the basilar. No vertebral stenosis. Flow is present in both posterior inferior cerebellar arteries on today's study. The anterior inferior cerebellar arteries are patent. Superior cerebellar and posterior cerebral arteries are patent. No proximal stenosis. There is atherosclerotic narrowing of the more distal branch vessels including 50% stenosis of the P2 segment on the left.  Venous sinuses: Patent and normal  Anatomic variants: As above  Delayed phase: None significant  IMPRESSION: Atherosclerotic disease at both carotid bifurcations. 30% stenosis of the proximal right ICA. No stenosis of the left ICA.  Stenoses at both vertebral artery origins. 30% or less narrowing of the dominant left vertebral artery origin. Estimated 70% stenosis of the nondominant right vertebral artery origin.  The vertebral arteries beyond that are widely patent to the basilar. Major  posterior circulation branch vessels show flow on the present study, including both posterior inferior cerebellar arteries, both anterior inferior cerebellar arteries, both superior cerebellar arteries and both posterior cerebral arteries.  Some distal vessel atherosclerotic narrowing and irregularity, more evident in the PCA branches. 50% stenosis of the left P2 segment.  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   Electronically Signed   By: Nelson Chimes M.D.   On: 07/09/2015 20:11   Mr Brain Wo Contrast  07/07/2015   CLINICAL DATA:  81 year old hypertensive male with vertigo. Subsequent encounter.  EXAM: MRI HEAD WITHOUT CONTRAST  TECHNIQUE: Multiplanar, multiecho pulse sequences of the brain and surrounding structures were obtained without intravenous contrast.  COMPARISON:  07/06/2015 head CT.  FINDINGS: Exam is motion degraded.  Large acute left cerebellar infarct involving majority of the mid to inferior aspect of the left cerebellum extending into the vermis and with minimal involvement posterior right cerebellum. Mass effect with narrowing of the fourth ventricle and fourth ventricular outflow placing the patient at risk for development of hydrocephalus. Currently no hydrocephalus.  Prominent small vessel disease type changes.  Mild global atrophy.  No intracranial mass lesion noted on this unenhanced exam.  Limited evaluation of intracranial vasculature secondary to motion degradation. The vertebral arteries, basilar artery and internal carotid arteries appear to be patent.  Polypoid opacification inferior maxillary sinuses with mild mucosal thickening ethmoid sinus air cells.  Mild degenerative changes upper cervical spine. Cervical medullary junction, pituitary region pineal region unremarkable. Post right lens replacement without orbital abnormality identified.  IMPRESSION: Exam is motion degraded.  Large acute left cerebellar infarct involving  majority of the mid to inferior aspect of the left cerebellum extending into the vermis and with minimal involvement posterior right cerebellum. Mass effect with narrowing of the fourth ventricle and fourth ventricular outflow placing the patient at risk for development of hydrocephalus. Currently no hydrocephalus. Close CT surveillance recommended.   Electronically Signed   By: Genia Del M.D.   On: 07/07/2015 14:01   US Abdomen Complete  07/06/2015   CLINICAL DATA:  Evaluate biliary dilatation seen on recent CT scan.  EXAM: ULTRASOUND ABDOMEN COMPLETE  COMPARISON:  CT scan 07/06/2015  FINDINGS: Gallbladder: Mobile echogenic foci in the gallbladder without acoustic shadowing. Possible small gallstones. There are also areas of ring down artifact most consistent with adenomyomatosis. No pericholecystic fluid or sonographic Murphy sign to suggest acute cholecystitis.  Common bile duct: Diameter: 8.0 mm  Liver: Intrahepatic biliary dilatation not well demonstrated. No focal hepatic lesions. Normal echogenicity.  IVC: Normal caliber.  Pancreas: Sonographically unremarkable.  Spleen: Normal size and echogenicity without focal lesions.  Right Kidney: Length: 11.2 cm. Normal renal cortical thickness and echogenicity without focal lesions or hydronephrosis.  Left Kidney: Length: 12.2 cm. Normal renal cortical thickness and echogenicity without focal lesions or hydronephrosis.  Abdominal aorta: Normal caliber.  Other findings: No ascites.  IMPRESSION: 1. Probable small gallstones and changes of adenomyomatosis. No findings for acute cholecystitis. 2. Normal caliber common bile duct for age. No common bile duct stones. 3. Unremarkable sonographic appearance of the liver, pancreas, spleen and both kidneys.   Electronically Signed   By: Marijo Sanes M.D.   On: 07/06/2015 10:38   Ct Abdomen Pelvis W Contrast  07/06/2015   CLINICAL DATA:  New onset nausea and vomiting. Diffuse abdominal tenderness. Modi out bowel  obstruction.  EXAM: CT ABDOMEN AND PELVIS WITH CONTRAST  TECHNIQUE: Multidetector CT imaging of the abdomen and pelvis was performed using the standard protocol following bolus administration of intravenous contrast.  CONTRAST:  193mL OMNIPAQUE IOHEXOL 300 MG/ML  SOLN  COMPARISON:  01/07/2012  FINDINGS: Lower chest: Volume loss in both lower lobes with linear atelectasis.  Liver: Mild intrahepatic biliary ductal dilatation. Tiny 6 mm hypodensity in the subcapsular right hepatic lobe, too small to characterize.  Hepatobiliary: Prominent common bile duct measuring 9 mm at the porta hepatis. Gallbladder is physiologically distended. Small layering stones or sludge.  Pancreas: No pancreatic ductal dilatation or surrounding inflammation. Mildly atrophic.  Spleen: Normal in size without focal lesion. Trace perisplenic fluid.  Adrenal glands: No nodule.  Kidneys: Symmetric renal enhancement. No hydronephrosis. Small bilateral cortical cysts.  Stomach/Bowel: Small hiatal hernia. Stomach physiologically distended. There are no dilated or thickened small bowel loops. Small-moderate volume of stool throughout the colon without colonic wall thickening. Cecum is high-riding in the mid abdomen. The appendix is normal.  Vascular/Lymphatic: No retroperitoneal adenopathy. Abdominal aorta is normal in caliber. Atherosclerosis of the abdominal aorta without aneurysm.  Reproductive: Marked enlargement of the prostate gland, measuring at least 9.4 x 8.4 cm in by axial dimension. This causes mass effect on the bladder base.  Bladder: Distended.  No wall thickening.  Other: No free air, free fluid, or intra-abdominal fluid collection. Fat containing bilateral inguinal hernias, left larger than right. No pelvic adenopathy.  Musculoskeletal: There are no acute or suspicious osseous abnormalities.  IMPRESSION: 1. No bowel obstruction.  No signs of bowel inflammation. 2. Mild intrahepatic biliary ductal dilatation, this is new from prior  exam. Common bile duct appears prominent measuring 9 mm. No abnormal gallbladder distention. Significance is uncertain. If patient is able tolerate breath hold technique in there is clinical concern for hepatobiliary pathology, an MRCP could be considered. 3. Marked enlargement of the prostate gland, similar to prior exam.   Electronically Signed   By: Jeb Levering M.D.   On: 07/06/2015 01:12    Microbiology: Recent Results (from the past 240 hour(s))  Culture, blood (x 2)     Status: None   Collection Time: 07/05/15  8:26 PM  Result Value Ref Range Status  Specimen Description BLOOD RIGHT ARM  Final   Special Requests   Final    BOTTLES DRAWN AEROBIC AND ANAEROBIC 5ML AER 1ML ANA   Culture NO GROWTH 5 DAYS  Final   Report Status 07/10/2015 FINAL  Final  Culture, blood (x 2)     Status: None   Collection Time: 07/05/15  8:30 PM  Result Value Ref Range Status   Specimen Description BLOOD LEFT ARM  Final   Special Requests   Final    BOTTLES DRAWN AEROBIC AND ANAEROBIC 5ML AER 1ML ANA   Culture NO GROWTH 5 DAYS  Final   Report Status 07/10/2015 FINAL  Final     Labs: Basic Metabolic Panel:  Recent Labs Lab 07/05/15 1654 07/06/15 0539 07/07/15 0503 07/11/15 0553  NA 134* 140 137 137  K 3.8 4.1 3.8 3.3*  CL 99* 107 104 104  CO2 24 26 28 30   GLUCOSE 219* 125* 120* 106*  BUN 17 11 11 8   CREATININE 1.16 1.08 1.17 1.09  CALCIUM 9.1 8.7* 8.7* 8.7*   Liver Function Tests:  Recent Labs Lab 07/05/15 1654  AST 27  ALT 34  ALKPHOS 89  BILITOT 0.5  PROT 7.3  ALBUMIN 4.0    Recent Labs Lab 07/05/15 1654  LIPASE 24   No results for input(s): AMMONIA in the last 168 hours. CBC:  Recent Labs Lab 07/05/15 1654 07/06/15 0539 07/07/15 0503  WBC 11.1* 8.3 8.0  NEUTROABS 10.2*  --   --   HGB 13.8 12.4* 12.4*  HCT 41.6 37.6* 38.8*  MCV 90.4 90.8 92.2  PLT 195 213 193   Cardiac Enzymes:  Recent Labs Lab 07/05/15 2026 07/06/15 0124 07/06/15 0700  TROPONINI  <0.03 <0.03 0.03   BNP: BNP (last 3 results) No results for input(s): BNP in the last 8760 hours.  ProBNP (last 3 results) No results for input(s): PROBNP in the last 8760 hours.  CBG:  Recent Labs Lab 07/10/15 1129 07/10/15 1717 07/10/15 2020 07/11/15 0755 07/11/15 1148  GLUCAP 129* 105* 151* 93 99       Signed:  Xu,Fang MD, PhD  [facility] 07/11/2015, 12:06 PM

## 2015-07-11 NOTE — Progress Notes (Signed)
Stroke Team Progress Note  HISTORY Travis [name]. is an 81 y.o. male who was on his porch on Saturday. Had acute onset nausea, vomiting and difficulty with gait. Initially patient did not want to go to the hospital but eventually was convinced. After admission with no improvement in symptoms patient was eventually convinced to have a MRI of the brain. Acute infarct diagnosed. Patient remains off balance and has some difficulty coordinating meals as well.  Wife reports patient had difficulty with balance for the past few weeks and has been experiencing some difficulty with memory.   Date last known well: Date: 07/05/2015 Time last known well: Time: 10:00 tPA Given: No: Outside time window . He was admitted to the neuro floor bed for further evaluation and treatment.  SUBJECTIVE Family members present. The patient is status post loop implantation. He is eager to go home. His cerebellar infarct has been 7 days out, clinically doing well. No need to repeat CT at this time.   OBJECTIVE Most recent Vital Signs: Filed Vitals:   07/10/15 1700 07/10/15 2021 07/11/15 0437 07/11/15 0944  BP: 160/72 133/55 155/59 175/76  Pulse: 58 59 55 54  Temp: 98.6 F (37 C) 98.7 F (37.1 C) 98.3 F (36.8 C) 98.4 F (36.9 C)  TempSrc: Oral Oral Oral Oral  Resp: 18 17 20 18   Height:      Weight:  96.435 kg (212 lb 9.6 oz)    SpO2: 96% 95% 96% 98%   CBG (last 3)   Recent Labs  07/10/15 2020 07/11/15 0755 07/11/15 1148  GLUCAP 151* 93 99    IV Fluid Intake:     MEDICATIONS  . clopidogrel  75 mg Oral Daily  . enoxaparin (LOVENOX) injection  40 mg Subcutaneous Q24H  . finasteride  5 mg Oral Daily  . insulin aspart  0-9 Units Subcutaneous TID WC  . [START ON 07/12/2015] lisinopril  10 mg Oral Daily  . sodium chloride  3 mL Intravenous Q12H  . terazosin  10 mg Oral QHS   PRN:  acetaminophen **OR** acetaminophen, hydrALAZINE, hydrOXYzine, iohexol, LORazepam, meclizine, morphine injection,  ondansetron (ZOFRAN) IV  Diet:  Diet heart healthy/carb modified Room service appropriate?: Yes; Fluid consistency:: Thin Diet - low sodium heart healthy  liquids Activity:   Up with assistance  DVT Prophylaxis:  SQ heparin  CLINICALLY SIGNIFICANT STUDIES Basic Metabolic Panel:   Recent Labs Lab 07/07/15 0503 07/11/15 0553 07/11/15 1100  NA 137 137  --   K 3.8 3.3*  --   CL 104 104  --   CO2 28 30  --   GLUCOSE 120* 106*  --   BUN 11 8  --   CREATININE 1.17 1.09  --   CALCIUM 8.7* 8.7*  --   MG  --   --  2.0   Liver Function Tests:   Recent Labs Lab 07/05/15 1654  AST 27  ALT 34  ALKPHOS 89  BILITOT 0.5  PROT 7.3  ALBUMIN 4.0   CBC:   Recent Labs Lab 07/05/15 1654 07/06/15 0539 07/07/15 0503  WBC 11.1* 8.3 8.0  NEUTROABS 10.2*  --   --   HGB 13.8 12.4* 12.4*  HCT 41.6 37.6* 38.8*  MCV 90.4 90.8 92.2  PLT 195 213 193   Coagulation:   Recent Labs Lab 07/05/15 2026  LABPROT 13.2  INR 0.98   Cardiac Enzymes:   Recent Labs Lab 07/05/15 2026 07/06/15 0124 07/06/15 0700  TROPONINI <0.03 <0.03 0.03  Urinalysis:   Recent Labs Lab 07/06/15 0727  COLORURINE YELLOW  LABSPEC 1.039*  PHURINE 6.0  GLUCOSEU NEGATIVE  HGBUR NEGATIVE  BILIRUBINUR NEGATIVE  KETONESUR NEGATIVE  PROTEINUR NEGATIVE  UROBILINOGEN 0.2  NITRITE NEGATIVE  LEUKOCYTESUR NEGATIVE   Lipid Panel    Component Value Date/Time   CHOL 138 07/09/2015 0405   TRIG 124 07/09/2015 0405   HDL 37* 07/09/2015 0405   CHOLHDL 3.7 07/09/2015 0405   VLDL 25 07/09/2015 0405   LDLCALC 76 07/09/2015 0405   HgbA1C  Lab Results  Component Value Date   HGBA1C 6.8* 07/06/2015    Urine Drug Screen:  No results found for: LABOPIA, COCAINSCRNUR, LABBENZ, AMPHETMU, THCU, LABBARB  Alcohol Level: No results for input(s): ETH in the last 168 hours.  IMAGING: I have personally reviewed the radiological images below and agree with the radiology interpretations.  MRI brain Large acute  left cerebellar infarct involving majority of the mid to inferior aspect of the left cerebellum extending into the vermis and with minimal involvement posterior right cerebellum. Mass effect with narrowing of the fourth ventricle and fourth ventricular outflow placing the patient at risk for development of hydrocephalus. Currently no hydrocephalus. Close CT surveillance recommended.  Ct Angio Head and Neck W/cm &/or Wo Cm 07/09/2015    Atherosclerotic disease at both carotid bifurcations. 30% stenosis of the proximal right ICA. No stenosis of the left ICA.  Stenoses at both vertebral artery origins. 30% or less narrowing of the dominant left vertebral artery origin. Estimated 70% stenosis of the nondominant right vertebral artery origin.  The vertebral arteries beyond that are widely patent to the basilar. Major posterior circulation branch vessels show flow on the present study, including both posterior inferior cerebellar arteries, both anterior inferior cerebellar arteries, both superior cerebellar arteries and both posterior cerebral arteries.  Some distal vessel atherosclerotic narrowing and irregularity, more evident in the PCA branches. 50% stenosis of the left P2 segment.  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     Carotid Doppler 1-39% internal carotid artery stenosis bilaterally. The left vertebral artery is patent with antegrade flow. Unable to visualize the right vertebral artery  2D Echocardiogram  Left ventricle: The cavity size was normal. Systolic function was normal. The estimated ejection fraction was in the range of 55% to 60%. Wall motion was normal; there were no regional wall motion abnormalities.  CXR  No acute cardiopulmonary disease.  Basilar atelectasis similar to the prior chest radiograph.  EKG   Sinus rhythm Prolonged PR interval  Physical Exam   Elderly male currently not in distress. . Afebrile. Head  is nontraumatic. Neck is supple without bruit.    Cardiac exam no murmur or gallop. Lungs are clear to auscultation. Distal pulses are well felt. Neurological Exam :   Awake  Alert oriented x 3. Normal speech and language.eye movements full without nystagmus.fundi were not visualized. Vision acuity and fields appear normal. Hearing is normal. Palatal movements are normal. Face symmetric. Tongue midline. Normal strength, tone, reflexes and coordination. Normal sensation. Gait deferred.   ASSESSMENT [name]. is a 81 y.o. male presenting with  nausea, vomiting, gait ataxia from left cerebellar infarct etiology to be determined. Carotid Dopplers suggest right vertebral artery occlusion. Need further evaluation with CT angiogram Infarct felt to be thrombotic.  Refused TEE but loop recorder placed today. On aspirin 325 mg orally every day prior to admission. Now on clopidogrel 75 mg orally every  day for secondary stroke prevention. Patient with resultant  Gait ataxia.    LDL 76  HbA1c 6.8  Hospital day # 6  TREATMENT/PLAN  Loop recorder placeed today ( patient declined TEE )  Home health physical therapy ( patient declined skilled nursing facility placement )  Continue Plavix  Follow-up Dr. Leonie Man in 2 months  Outpatient follow-up anterior communicating artery aneurysm.   Mikey Bussing PA-C [facility] Pager [phone] 07/11/2015, 2:23 PM   I, the attending vascular neurologist, have personally obtained a history, examined the patient, evaluated laboratory data, individually viewed imaging studies and agree with radiology interpretations. I also discussed with wife and daughter at beside and Dr. Florencia Reasons over the phone regarding his care plan. Together with the NP/PA, we formulated the assessment and plan of care which reflects our mutual decision.  I have made any additions or clarifications directly to the above note and agree with the findings and plan as  currently documented.   Neurology will sign off. Please call with questions. Pt will follow up with Dr. Leonie Man at [facility] in about 2 months. Thanks for the consult.  Rosalin Hawking, MD PhD Stroke Neurology 07/12/2015 11:44 AM           To contact Stroke Continuity provider, please refer to [email or web address] After hours, contact General Neurology

## 2015-07-11 NOTE — Progress Notes (Signed)
Travis [name]. to be D/C'd Rehab per MD order.  Discussed prescriptions and follow up appointments with the patient. Prescriptions given to patient, medication list explained in detail. Pt verbalized understanding.    Medication List    STOP taking these medications        aspirin 325 MG EC tablet     hydrochlorothiazide 50 MG tablet  Commonly known as:  HYDRODIURIL      TAKE these medications        clopidogrel 75 MG tablet  Commonly known as:  PLAVIX  Take 1 tablet (75 mg total) by mouth daily.     finasteride 5 MG tablet  Commonly known as:  PROSCAR  Take 5 mg by mouth daily.     lisinopril 10 MG tablet  Commonly known as:  PRINIVIL,ZESTRIL  Take 1 tablet (10 mg total) by mouth daily.  Start taking on:  07/12/2015     meclizine 25 MG tablet  Commonly known as:  ANTIVERT  Take 1 tablet (25 mg total) by mouth 3 (three) times daily as needed for dizziness.     metFORMIN 500 MG tablet  Commonly known as:  GLUCOPHAGE  Take 1 tablet (500 mg total) by mouth 2 (two) times daily with a meal.     REFRESH OP  Place 1 drop into the right eye daily as needed. For dry eye per patient     terazosin 10 MG capsule  Commonly known as:  HYTRIN  Take 10 mg by mouth at bedtime.        Filed Vitals:   07/11/15 0944  BP: 175/76  Pulse: 54  Temp: 98.4 F (36.9 C)  Resp: 18    Skin clean, dry and intact without evidence of skin break down, no evidence of skin tears noted. IV catheter discontinued intact. Site without signs and symptoms of complications. Dressing and pressure applied. Pt denies pain at this time. No complaints noted.  An After Visit Summary was printed and given to the patient. Patient escorted via [place], and D/C home via private auto.  [employer], RN-BC, RN3 [facility] [address] Phone [phone]   '

## 2015-07-12 DIAGNOSIS — E785 Hyperlipidemia, unspecified: Secondary | ICD-10-CM | POA: Insufficient documentation

## 2015-07-14 ENCOUNTER — Other Ambulatory Visit (HOSPITAL_COMMUNITY): Payer: Self-pay | Admitting: Internal Medicine

## 2015-07-14 DIAGNOSIS — R519 Headache, unspecified: Secondary | ICD-10-CM

## 2015-07-14 DIAGNOSIS — R51 Headache: Principal | ICD-10-CM

## 2015-07-17 ENCOUNTER — Ambulatory Visit (HOSPITAL_COMMUNITY)
Admission: RE | Admit: 2015-07-17 | Discharge: 2015-07-17 | Disposition: A | Discharge status: Home / Self Care | Payer: Medicare Other | Source: Ambulatory Visit | Attending: Internal Medicine | Admitting: Internal Medicine

## 2015-07-17 ENCOUNTER — Encounter (HOSPITAL_COMMUNITY): Payer: Self-pay

## 2015-07-17 DIAGNOSIS — G9389 Other specified disorders of brain: Secondary | ICD-10-CM | POA: Diagnosis not present

## 2015-07-17 DIAGNOSIS — R51 Headache: Secondary | ICD-10-CM | POA: Insufficient documentation

## 2015-07-17 DIAGNOSIS — G319 Degenerative disease of nervous system, unspecified: Secondary | ICD-10-CM | POA: Insufficient documentation

## 2015-07-17 DIAGNOSIS — Z8673 Personal history of transient ischemic attack (TIA), and cerebral infarction without residual deficits: Secondary | ICD-10-CM | POA: Insufficient documentation

## 2015-07-17 DIAGNOSIS — R9082 White matter disease, unspecified: Secondary | ICD-10-CM | POA: Diagnosis not present

## 2015-07-17 DIAGNOSIS — R519 Headache, unspecified: Secondary | ICD-10-CM

## 2015-07-21 ENCOUNTER — Ambulatory Visit (INDEPENDENT_AMBULATORY_CARE_PROVIDER_SITE_OTHER): Payer: Self-pay | Admitting: *Deleted

## 2015-07-21 DIAGNOSIS — I6330 Cerebral infarction due to thrombosis of unspecified cerebral artery: Secondary | ICD-10-CM

## 2015-07-21 LAB — CUP PACEART INCLINIC DEVICE CHECK
Date Time Interrogation Session: 20161003165912
MDC IDC SET ZONE DETECTION INTERVAL: 2000 ms
MDC IDC SET ZONE DETECTION INTERVAL: 3000 ms
Zone Setting Detection Interval: 410 ms

## 2015-07-21 NOTE — Progress Notes (Signed)
ILR Wound check appointment. Steri-strips removed prior to arrival. Wound without redness or edema. Incision edges approximated, wound well healed. Loop check in clinic.  Pt with 0 tachy episodes; 0 brady episodes; 0 asystole. Carelink summary reports QMO & ROV w/ WC 10/24/15.

## 2015-08-01 ENCOUNTER — Telehealth: Payer: Self-pay | Admitting: *Deleted

## 2015-08-01 NOTE — Telephone Encounter (Signed)
Spoke with patient regarding tachy episode on LINQ transmission from 07/31/15.  Patient asymptomatic at time of episode.  Episode placed in Dr. Kathalene Frames folder for review.  Patient made aware that our office will call him if Dr. Curt Bears has any additional recommendations.  Patient voices understanding and denies any questions or concerns at this time.

## 2015-08-11 ENCOUNTER — Ambulatory Visit (INDEPENDENT_AMBULATORY_CARE_PROVIDER_SITE_OTHER): Payer: Medicare Other | Admitting: *Deleted

## 2015-08-11 DIAGNOSIS — I6330 Cerebral infarction due to thrombosis of unspecified cerebral artery: Secondary | ICD-10-CM

## 2015-08-11 NOTE — Progress Notes (Signed)
Loop recorder 

## 2015-09-08 ENCOUNTER — Ambulatory Visit (INDEPENDENT_AMBULATORY_CARE_PROVIDER_SITE_OTHER): Payer: Medicare Other | Admitting: *Deleted

## 2015-09-08 DIAGNOSIS — I6330 Cerebral infarction due to thrombosis of unspecified cerebral artery: Secondary | ICD-10-CM | POA: Diagnosis not present

## 2015-09-09 NOTE — Progress Notes (Signed)
Carelink Summary Report / Loop Recorder 

## 2015-09-12 LAB — CUP PACEART REMOTE DEVICE CHECK: MDC IDC SESS DTM: 20161022200531

## 2015-09-12 NOTE — Progress Notes (Signed)
Carelink summary report received. Battery status OK. Normal device function. No new symptom, brady, pause, or AF episodes. 1 tachy episode likely SVT, duration 54min 14sec, peak V 154bpm, patient asymptomatic. Monthly summary reports and ROV with WC on 10/24/15 at 3:00pm.

## 2015-09-22 ENCOUNTER — Ambulatory Visit (INDEPENDENT_AMBULATORY_CARE_PROVIDER_SITE_OTHER): Payer: Medicare Other | Admitting: Neurology

## 2015-09-22 ENCOUNTER — Encounter: Payer: Self-pay | Admitting: Neurology

## 2015-09-22 VITALS — BP 129/78 | HR 83 | Ht 67.0 in | Wt 210.6 lb

## 2015-09-22 DIAGNOSIS — I639 Cerebral infarction, unspecified: Secondary | ICD-10-CM | POA: Diagnosis not present

## 2015-09-22 NOTE — Progress Notes (Signed)
[facility] [address] [place]. [place] [address] [phone]       OFFICE FOLLOW-UP NOTE  Travis [name]. Date of Birth:  12-14-1929 Medical Record Number:  [id number]   HPI: Travis [name] is a pleasant 81 year old Caucasian male seen today for the first office follow-up visit following hospital admission for stroke in September 2016.Travis [name]. is an 81 y.o. male who was on his porch on  07/05/15 when he had acute onset nausea, vomiting and difficulty with gait. Initially patient did not want to go to the hospital but eventually was convinced. After admission with no improvement in symptoms patient was eventually convinced to have a MRI of the brain. Acute infarct diagnosed.  Wife reported patient had difficulty with balance for the past few weeks and has been experiencing some difficulty with memory.  Date last known well: Date: 07/05/2015 Time last known well: Time: 10:00 tPA Given: No: Outside time window . He was admitted to the neuro floor bed for further evaluation and treatment. 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 There is 30% stenosis of the dominant left vertebral artery origin and 70% stenosis of the nondominant right vertebral artery. 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 Carotid Doppler showed no extracranial stenosis. The right vertebral artery was not visualized and left showed antegrade flow. Transthoracic echo showed normal ejection fraction. LDL cholesterol was 76 kg percent. Hemoglobin A1c was 6.8. MRI scan showed a left posterior inferior cerebellar artery infarct. Telemetry monitoring did not reveal cardiac arrhythmias. Patient refused to undergo TEE. Loop recorder was placed and so far he to fibrillation has not been found. Patient states his done well since discharge is able to walk with a cane he still staggers a  bit particularly when he started to move quickly. His fortunately had no falls. He also has significant pain in his left knee and hip from arthritis which also limits his walking. He is tolerating Plavix well without significant bleeding and only minor bruising. His blood pressure is well controlled and today it is 129/78. His sugars also adequately controlled. He has no complaints today.  ROS:   14 system review of systems is positive for  staggering, gait difficulty, balance problems, knee pain, hip pain all other systems negative  PMH:  Past Medical History  Diagnosis Date  . Hypertension   . PSA elevation   . Arthritis   . Pre-diabetes   . Chronic kidney disease     stone 1990  . Stroke [facility])     Social History:  Social History   Social History  . Marital Status: Married    Spouse Name: N/A  . Number of Children: N/A  . Years of Education: N/A   Occupational History  . Not on file.   Social History Main Topics  . Smoking status: Former Smoker -- 2.00 packs/day for 18 years    Types: Cigarettes, Cigars    Quit date: 10/03/1962  . Smokeless tobacco: Former Systems developer    Types: Chew  . Alcohol Use: Yes     Comment: vodka socially  . Drug Use: No  . Sexual Activity: Not on file   Other Topics Concern  . Not on file   Social History Narrative    Medications:   Current Outpatient Prescriptions on File Prior to Visit  Medication Sig Dispense Refill  . clopidogrel (PLAVIX) 75  MG tablet Take 1 tablet (75 mg total) by mouth daily. 30 tablet 3  . finasteride (PROSCAR) 5 MG tablet Take 5 mg by mouth daily.    Marland Kitchen lisinopril (PRINIVIL,ZESTRIL) 10 MG tablet Take 1 tablet (10 mg total) by mouth daily. 30 tablet 3  . meclizine (ANTIVERT) 25 MG tablet Take 1 tablet (25 mg total) by mouth 3 (three) times daily as needed for dizziness. 15 tablet 0  . metFORMIN (GLUCOPHAGE) 500 MG tablet Take 1 tablet (500 mg total) by mouth 2 (two) times daily with a meal. 60 tablet 0  . Polyvinyl  Alcohol-Povidone (REFRESH OP) Place 1 drop into the right eye daily as needed. For dry eye per patient    . terazosin (HYTRIN) 10 MG capsule Take 10 mg by mouth at bedtime.     No current facility-administered medications on file prior to visit.    Allergies:  No Known Allergies  Physical Exam General: well developed, well nourished, seated, in no evident distress Head: head normocephalic and atraumatic.  Neck: supple with no carotid or supraclavicular bruits Cardiovascular: regular rate and rhythm, no murmurs Musculoskeletal: no deformity Skin:  no rash/petichiae Vascular:  Normal pulses all extremities Filed Vitals:   09/22/15 1334  BP: 129/78  Pulse: 83   Neurologic Exam Mental Status: Awake and fully alert. Oriented to place and time. Recent and remote memory intact. Attention span, concentration and fund of knowledge appropriate. Mood and affect appropriate.  Cranial Nerves: Fundoscopic exam reveals sharp disc margins. Pupils equal, briskly reactive to light. Extraocular movements full without nystagmus. Visual fields full to confrontation. Hearing intact. Facial sensation intact. Face, tongue, palate moves normally and symmetrically.  Motor: Normal bulk and tone. Normal strength in all tested extremity muscles. Sensory.: intact to touch ,pinprick .position and vibratory sensation.  Coordination: Rapid alternating movements normal in all extremities. Finger-to-nose and heel-to-shin performed accurately bilaterally. Gait and Station: Arises from chair without difficulty. Stance is normal. Gait demonstrates normal stride length and balance . Able to heel, toe and tandem walk with slight difficulty.  Reflexes: 1+ and symmetric. Toes downgoing.   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 Vascular risk factors of diabetes, hypertension and borderline  hyperlipidemia    PLAN: I had a long d/w patient and wife about his recent stroke, risk for recurrent stroke/TIAs, personally independently reviewed imaging studies and stroke evaluation results and answered questions.Continue Plavix  for secondary stroke prevention and maintain strict control of hypertension with blood pressure goal below 130/90, diabetes with hemoglobin A1c goal below 6.5% and lipids with LDL cholesterol goal below 70 mg/dL. I also advised the patient to eat a healthy diet with plenty of whole grains, cereals, fruits and vegetables, exercise regularly and maintain ideal body weight. I also talked to him about fall risk prevention .Consider possible participation in the RESPECT ESUS cryptogenic stroke trial if interestedGreater than 50% of time during this 25 minute visit was spent on counseling,explanation of diagnosis, planning of further management, discussion with patient and family and coordination of care .Followup in the future with me in  3 months or call earlier if necessary  Antony Contras, MD  Note: This document was prepared with digital dictation and possible smart phrase technology. Any transcriptional errors that result from this process are unintentional

## 2015-09-22 NOTE — Patient Instructions (Signed)
I had a long d/w patient and wife about his recent stroke, risk for recurrent stroke/TIAs, personally independently reviewed imaging studies and stroke evaluation results and answered questions.Continue Plavix  for secondary stroke prevention and maintain strict control of hypertension with blood pressure goal below 130/90, diabetes with hemoglobin A1c goal below 6.5% and lipids with LDL cholesterol goal below 70 mg/dL. I also advised the patient to eat a healthy diet with plenty of whole grains, cereals, fruits and vegetables, exercise regularly and maintain ideal body weight. I also talked to him about fall risk prevention .Consider possible participation in the RESPECT ES[facility] cryptogenic stroke trial if interested Followup in the future with me in  3 months or call earlier if necessary  Stroke Prevention Some medical conditions and behaviors are associated with an increased chance of having a stroke. You may prevent a stroke by making healthy choices and managing medical conditions. HOW CAN I REDUCE MY RISK OF HAVING A STROKE?   Stay physically active. Get at least 30 minutes of activity on most or all days.  Do not smoke. It may also be helpful to avoid exposure to secondhand smoke.  Limit alcohol use. Moderate alcohol use is considered to be:  No more than 2 drinks per day for men.  No more than 1 drink per day for nonpregnant women.  Eat healthy foods. This involves:  Eating 5 or more servings of fruits and vegetables a day.  Making dietary changes that address high blood pressure (hypertension), high cholesterol, diabetes, or obesity.  Manage your cholesterol levels.  Making food choices that are high in fiber and low in saturated fat, trans fat, and cholesterol may control cholesterol levels.  Take any prescribed medicines to control cholesterol as directed by your health care provider.  Manage your diabetes.  Controlling your carbohydrate and sugar intake is recommended to manage  diabetes.  Take any prescribed medicines to control diabetes as directed by your health care provider.  Control your hypertension.  Making food choices that are low in salt (sodium), saturated fat, trans fat, and cholesterol is recommended to manage hypertension.  Ask your health care provider if you need treatment to lower your blood pressure. Take any prescribed medicines to control hypertension as directed by your health care provider.  If you are 5-77 years of age, have your blood pressure checked every 3-5 years. If you are 82 years of age or older, have your blood pressure checked every year.  Maintain a healthy weight.  Reducing calorie intake and making food choices that are low in sodium, saturated fat, trans fat, and cholesterol are recommended to manage weight.  Stop drug abuse.  Avoid taking birth control pills.  Talk to your health care provider about the risks of taking birth control pills if you are over 70 years old, smoke, get migraines, or have ever had a blood clot.  Get evaluated for sleep disorders (sleep apnea).  Talk to your health care provider about getting a sleep evaluation if you snore a lot or have excessive sleepiness.  Take medicines only as directed by your health care provider.  For some people, aspirin or blood thinners (anticoagulants) are helpful in reducing the risk of forming abnormal blood clots that can lead to stroke. If you have the irregular heart rhythm of atrial fibrillation, you should be on a blood thinner unless there is a good reason you cannot take them.  Understand all your medicine instructions.  Make sure that other conditions (such as anemia  or atherosclerosis) are addressed. SEEK IMMEDIATE MEDICAL CARE IF:   You have sudden weakness or numbness of the face, arm, or leg, especially on one side of the body.  Your face or eyelid droops to one side.  You have sudden confusion.  You have trouble speaking (aphasia) or  understanding.  You have sudden trouble seeing in one or both eyes.  You have sudden trouble walking.  You have dizziness.  You have a loss of balance or coordination.  You have a sudden, severe headache with no known cause.  You have new chest pain or an irregular heartbeat. Any of these symptoms may represent a serious problem that is an emergency. Do not wait to see if the symptoms will go away. Get medical help at once. Call your local emergency services (911 in [place] Do not drive yourself to the hospital.   This information is not intended to replace advice given to you by your health care provider. Make sure you discuss any questions you have with your health care provider.   Document Released: 11/11/2004 Document Revised: 10/25/2014 Document Reviewed: 04/06/2013 Elsevier Interactive Patient Education 2016 [place] in the Home  Falls can cause injuries and can affect people from all age groups. There are many simple things that you can do to make your home safe and to help prevent falls. WHAT CAN I DO ON THE OUTSIDE OF MY HOME?  Regularly repair the edges of walkways and driveways and fix any cracks.  Remove high doorway thresholds.  Trim any shrubbery on the main path into your home.  Use bright outdoor lighting.  Clear walkways of debris and clutter, including tools and rocks.  Regularly check that handrails are securely fastened and in good repair. Both sides of any steps should have handrails.  Install guardrails along the edges of any raised decks or porches.  Have leaves, snow, and ice cleared regularly.  Use sand or salt on walkways during winter months.  In the garage, clean up any spills right away, including grease or oil spills. WHAT CAN I DO IN THE BATHROOM?  Use night lights.  Install grab bars by the toilet and in the tub and shower. Do not use towel bars as grab bars.  Use non-skid mats or decals on the floor of the tub or  shower.  If you need to sit down while you are in the shower, use a plastic, non-slip stool.[place]land Kitchen  Keep the floor dry. Immediately clean up any water that spills on the floor.  Remove soap buildup in the tub or shower on a regular basis.  Attach bath mats securely with double-sided non-slip rug tape.  Remove throw rugs and other tripping hazards from the floor. WHAT CAN I DO IN THE BEDROOM?  Use night lights.  Make sure that a bedside light is easy to reach.  Do not use oversized bedding that drapes onto the floor.  Have a firm chair that has side arms to use for getting dressed.  Remove throw rugs and other tripping hazards from the floor. WHAT CAN I DO IN THE KITCHEN?   Clean up any spills right away.  Avoid walking on wet floors.  Place frequently used items in easy-to-reach places.  If you need to reach for something above you, use a sturdy step stool that has a grab bar.  Keep electrical cables out of the way.  Do not use floor polish or wax that makes floors slippery. If you have to  use wax, make sure that it is non-skid floor wax.  Remove throw rugs and other tripping hazards from the floor. WHAT CAN I DO IN THE STAIRWAYS?  Do not leave any items on the stairs.  Make sure that there are handrails on both sides of the stairs. Fix handrails that are broken or loose. Make sure that handrails are as long as the stairways.  Check any carpeting to make sure that it is firmly attached to the stairs. Fix any carpet that is loose or worn.  Avoid having throw rugs at the top or bottom of stairways, or secure the rugs with carpet tape to prevent them from moving.  Make sure that you have a light switch at the top of the stairs and the bottom of the stairs. If you do not have them, have them installed. WHAT ARE SOME OTHER FALL PREVENTION TIPS?  Wear closed-toe shoes that fit well and support your feet. Wear shoes that have rubber soles or low heels.  When you use a  stepladder, make sure that it is completely opened and that the sides are firmly locked. Have someone hold the ladder while you are using it. Do not climb a closed stepladder.  Add color or contrast paint or tape to grab bars and handrails in your home. Place contrasting color strips on the first and last steps.  Use mobility aids as needed, such as canes, walkers, scooters, and crutches.  Turn on lights if it is dark. Replace any light bulbs that burn out.  Set up furniture so that there are clear paths. Keep the furniture in the same spot.  Fix any uneven floor surfaces.  Choose a carpet design that does not hide the edge of steps of a stairway.  Be aware of any and all pets.  Review your medicines with your healthcare provider. Some medicines can cause dizziness or changes in blood pressure, which increase your risk of falling. Talk with your health care provider about other ways that you can decrease your risk of falls. This may include working with a physical therapist or trainer to improve your strength, balance, and endurance.   This information is not intended to replace advice given to you by your health care provider. Make sure you discuss any questions you have with your health care provider.   Document Released: 09/24/2002 Document Revised: 02/18/2015 Document Reviewed: 11/08/2014 Elsevier Interactive Patient Education [employer].

## 2015-09-24 ENCOUNTER — Encounter: Payer: Self-pay | Admitting: Cardiology

## 2015-10-07 ENCOUNTER — Encounter: Payer: Self-pay | Admitting: Cardiology

## 2015-10-08 ENCOUNTER — Ambulatory Visit (INDEPENDENT_AMBULATORY_CARE_PROVIDER_SITE_OTHER): Payer: Medicare Other | Admitting: *Deleted

## 2015-10-08 DIAGNOSIS — I6330 Cerebral infarction due to thrombosis of unspecified cerebral artery: Secondary | ICD-10-CM

## 2015-10-09 NOTE — Progress Notes (Signed)
Carelink Summary Report / Loop Recorder 

## 2015-10-16 LAB — CUP PACEART REMOTE DEVICE CHECK: MDC IDC SESS DTM: 20161121200815

## 2015-10-21 ENCOUNTER — Encounter: Payer: Self-pay | Admitting: Cardiology

## 2015-10-24 ENCOUNTER — Encounter: Payer: Self-pay | Admitting: Cardiology

## 2015-10-24 ENCOUNTER — Encounter: Payer: Medicare Other | Admitting: Cardiology

## 2015-10-24 ENCOUNTER — Ambulatory Visit (INDEPENDENT_AMBULATORY_CARE_PROVIDER_SITE_OTHER): Payer: Medicare Other | Admitting: Cardiology

## 2015-10-24 VITALS — BP 120/74 | HR 72 | Ht 69.0 in | Wt 209.4 lb

## 2015-10-24 DIAGNOSIS — I639 Cerebral infarction, unspecified: Secondary | ICD-10-CM | POA: Diagnosis not present

## 2015-10-24 NOTE — Progress Notes (Signed)
Electrophysiology Office Note   Date:  10/24/2015   ID:  Travis Rocks., DOB 1929-10-20, MRN [id number]  PCP:  Travis Downing, MD   Primary Electrophysiologist:  Travis Haw, MD    Chief Complaint  Patient presents with  . Pacemaker Check    loop recorder 90 day post     History of Present Illness: Travis Demont. is a 82 y.o. male who presents today for electrophysiology evaluation.   He presents today for follow-up of his Linq monitor. He had a cryptogenic stroke in September 2016.his symptoms at that time were nausea, vomiting, and gait difficulties. He had also been having difficulty with memory. At the time he was in sinus rhythm and had an echocardiogram which showed a normal EF and no wall motion abnormalities.   Today, he denies symptoms of palpitations, chest pain, shortness of breath, orthopnea, PND, lower extremity edema, claudication, dizziness, presyncope, syncope, bleeding, or neurologic sequela. The patient is tolerating medications without difficulties and is otherwise without complaint today.    Past Medical History  Diagnosis Date  . Hypertension   . PSA elevation   . Arthritis   . Pre-diabetes   . Chronic kidney disease     stone 1990  . Stroke [name]    Past Surgical History  Procedure Laterality Date  . Knee surgery    . Replacement total knee  11/20/10    left  . Joint replacement    . Inguinal hernia repair  10/06/2011    Procedure: HERNIA REPAIR INGUINAL ADULT;  Surgeon: Travis Bowie, MD;  Location: [facility];  Service: General;  Laterality: Left;  Open Left Inguinal Hernia Repair with Mesh  . Umbilical hernia repair  10/06/2011    Procedure: HERNIA REPAIR UMBILICAL ADULT;  Surgeon: Travis Bowie, MD;  Location: [facility];  Service: General;  Laterality: N/A;  . Ep implantable device N/A 07/10/2015    Procedure: Loop Recorder Insertion;  Surgeon: Travis Overholser Meredith Leeds, MD;  Location: [place] [facility];  Service:  Cardiovascular;  Laterality: N/A;     Current Outpatient Prescriptions  Medication Sig Dispense Refill  . clopidogrel (PLAVIX) 75 MG tablet Take 1 tablet (75 mg total) by mouth daily. 30 tablet 3  . finasteride (PROSCAR) 5 MG tablet Take 5 mg by mouth daily.    . hydrochlorothiazide (HYDRODIURIL) 25 MG tablet     . lisinopril (PRINIVIL,ZESTRIL) 10 MG tablet Take 1 tablet (10 mg total) by mouth daily. 30 tablet 3  . metFORMIN (GLUCOPHAGE) 500 MG tablet Take 1 tablet (500 mg total) by mouth 2 (two) times daily with a meal. 60 tablet 0  . Polyvinyl Alcohol-Povidone (REFRESH OP) Place 1 drop into the right eye daily as needed. For dry eye per patient    . terazosin (HYTRIN) 10 MG capsule Take 10 mg by mouth at bedtime.     No current facility-administered medications for this visit.    Allergies:   Review of patient's allergies indicates no known allergies.   Social History:  The patient  reports that he quit smoking about 53 years ago. His smoking use included Cigarettes and Cigars. He has a 36 pack-year smoking history. He has quit using smokeless tobacco. His smokeless tobacco use included Chew. He reports that he drinks alcohol. He reports that he does not use illicit drugs.   Family History:  The patient's family history includes Heart disease in his father.    ROS:  Please see the history of  present illness.   All other systems are reviewed and negative.    PHYSICAL EXAM: VS:  BP 120/74 mmHg  Pulse 72  Ht 5\' 9"  (1.753 m)  Wt 209 lb 6.4 oz (94.983 kg)  BMI 30.91 kg/m2 , BMI Body mass index is 30.91 kg/(m^2). GEN: Well nourished, well developed, in no acute distress HEENT: normal Neck: no JVD, carotid bruits, or masses Cardiac: RRR; no murmurs, rubs, or gallops,no edema  Respiratory:  clear to auscultation bilaterally, normal work of breathing GI: soft, nontender, nondistended, + BS MS: no deformity or atrophy Skin: warm and dry,  device pocket is well healed Neuro:  Strength  and sensation are intact Psych: euthymic mood, full affect  EKG:  EKG is not ordered today.    Device interrogation is reviewed today in detail.  See PaceArt for details.   Recent Labs: 07/05/2015: ALT 34 07/07/2015: Hemoglobin 12.4*; Platelets 193 07/11/2015: BUN 8; Creatinine, Ser 1.09; Magnesium 2.0; Potassium 3.3*; Sodium 137    Lipid Panel     Component Value Date/Time   CHOL 138 07/09/2015 0405   TRIG 124 07/09/2015 0405   HDL 37* 07/09/2015 0405   CHOLHDL 3.7 07/09/2015 0405   VLDL 25 07/09/2015 0405   LDLCALC 76 07/09/2015 0405     Wt Readings from Last 3 Encounters:  10/24/15 209 lb 6.4 oz (94.983 kg)  09/22/15 210 lb 9.6 oz (95.528 kg)  07/10/15 212 lb 9.6 oz (96.435 kg)      Other studies Reviewed: Additional studies/ records that were reviewed today include: TTE 07/08/15 Review of the above records today demonstrates:  - Left ventricle: The cavity size was normal. Systolic function was normal. The estimated ejection fraction was in the range of 55% to 60%. Wall motion was normal; there were no regional wall motion abnormalities. - Aortic valve: Trileaflet; mildly thickened, mildly calcified leaflets. There was mild regurgitation. - Aorta: Aortic root dimension: 40 mm (ED). Ascending aorta diameter: 42 mm (ED). - Aortic root: The aortic root was mildly dilated. - Ascending aorta: The ascending aorta was mildly dilated.    ASSESSMENT AND PLAN:  1.  Cryptogenic stroke: no evidence of atrial fibrillation on his Linq monitor. He has had sinus pauses of up to 4 seconds. We [name] continue to monitor this as he is not symptomatic at all. I have discussed with him the pauses and the options for pacemaker.for his pauses we [name] see him back in one year. I told him that if he does become more symptomatically ceased to call the clinic and we [name] hope to correlate his symptoms with his Linq monitor results.  Current medicines are reviewed at length with the  patient today.   The patient does not have concerns regarding his medicines.  The following changes were made today:  none  Labs/ tests ordered today include:  No orders of the defined types were placed in this encounter.     Disposition:   FU with Hartley Urton 1 year  Signed, Tashea Othman Meredith Leeds, MD  10/24/2015 11:41 AM     [facility] [address] [place] [address] [phone] (office) [phone] (fax)

## 2015-10-24 NOTE — Patient Instructions (Signed)
Medication Instructions:  Your physician recommends that you continue on your current medications as directed. Please refer to the Current Medication list given to you today.  Labwork: None ordered  Testing/Procedures: None ordered  Follow-Up: Your physician wants you to follow-up in: 1 year with Dr. Camnitz.  You will receive a reminder letter in the mail two months in advance. If you don't receive a letter, please call our office to schedule the follow-up appointment.  If you need a refill on your cardiac medications before your next appointment, please call your pharmacy.  Thank you for choosing [facility]!!   Carlis Blanchard, RN [phone]      

## 2015-10-25 LAB — CUP PACEART REMOTE DEVICE CHECK: MDC IDC SESS DTM: 20161221200705

## 2015-10-28 ENCOUNTER — Telehealth: Payer: Self-pay | Admitting: Cardiology

## 2015-10-28 NOTE — Telephone Encounter (Signed)
New message    1. What dental office are you calling from? Dr. Radford Pax   2. What is your office phone and fax number? [phone]  / fax [phone]   3. What type of procedure is the patient having performed?  Extraction   4. What date is procedure scheduled? Pending   5. What is your question (ex. Antibiotics prior to procedure, holding medication-we need to know how long dentist wants pt to hold med)? plavix 75 mg

## 2015-10-28 NOTE — Telephone Encounter (Signed)
New message     Pt said his loop recorder indicated that he was having some pauses.  Talk to the nurse regarding this and a need for a pacemaker

## 2015-10-28 NOTE — Telephone Encounter (Signed)
Patient requested the times of the pause episodes. Times given to patient.  Patient asked about whether or not Dr.Camnitz thought that getting a ppm was necessary at this point due to his recent pause episodes. I explained to patient that per Dr.Camnitz's note he wanted to continue to monitor his ILR being that he was asymptomatic with the pauses.   Patient stated that he still wanted to know Dr.Camnitz's thoughts about the ppm regardless of this information.   Will inform Dr.Camnitz and notify patient of any recommendations.  Patient also asked about how long he would be able to hold his Plavix for his upcoming dental procedure. I told patient to have his dentist to fax a form for [place] to fill out with this information. Fax number given.

## 2015-10-29 ENCOUNTER — Telehealth: Payer: Self-pay | Admitting: Neurology

## 2015-10-29 NOTE — Telephone Encounter (Signed)
Rn call patients dentist office and spoke to [place]. Rn explain Dr.Sethi is out of the office this week. Message will be sent to [place]. Rn explain a phone call will be made with recommendations.

## 2015-10-29 NOTE — Telephone Encounter (Signed)
Advised patient LINQ monitor report reviewed by Dr. Curt Bears and not concerning at this time. Confirmed that he is asymptomatic w/ pauses.  Informed patient that if at any time symptoms begin then he should call office to discuss, but that w/o symptoms PPM implant is not needed.  Patient verbalized understanding and agreeable to plan.

## 2015-10-29 NOTE — Telephone Encounter (Signed)
Informed dental office that neurology started this medication and to discuss with them about stopping it. They will call Dr. Clydene Fake office to discuss.

## 2015-10-29 NOTE — Telephone Encounter (Signed)
Travis [name] with Dr [facility] dental called said pt is having tooth extracted and needs to know how long pt needs to be off plavix prior to surgery. She can be reached at [phone].

## 2015-10-30 NOTE — Telephone Encounter (Signed)
Rn call Baxter Flattery at Dr. [facility] office at [phone] about pts clearance letter. Rn explain letter was type and sign by the MD for tooth extraction procedure. Rn stated per Dr.Sethi there is a small but acceptable pre procedure risk of TIA or Stroke. Baxter Flattery gave fax number at [phone]. Letter fax and receive and confirm.

## 2015-10-30 NOTE — Telephone Encounter (Signed)
Letter signed 10/29/15

## 2015-11-07 ENCOUNTER — Ambulatory Visit (INDEPENDENT_AMBULATORY_CARE_PROVIDER_SITE_OTHER): Payer: Medicare Other | Admitting: *Deleted

## 2015-11-07 DIAGNOSIS — I639 Cerebral infarction, unspecified: Secondary | ICD-10-CM

## 2015-11-07 NOTE — Progress Notes (Signed)
Carelink Summary Report / Loop Recorder 

## 2015-11-19 ENCOUNTER — Encounter: Payer: Self-pay | Admitting: Cardiology

## 2015-11-20 ENCOUNTER — Telehealth: Payer: Self-pay | Admitting: *Deleted

## 2015-11-20 NOTE — Telephone Encounter (Signed)
Called patient regarding tachy episodes on [employer] transmissions from 11/15/15 and 11/18/15.  Patient states he was asymptomatic during these episodes and that he may have been "digging" at the time.  He is aware to call with worsening symptoms, questions, or concerns.  Patient denies any additional questions at this time.

## 2015-11-30 ENCOUNTER — Encounter: Payer: Self-pay | Admitting: Cardiology

## 2015-12-08 ENCOUNTER — Ambulatory Visit (INDEPENDENT_AMBULATORY_CARE_PROVIDER_SITE_OTHER): Payer: Medicare Other | Admitting: *Deleted

## 2015-12-08 DIAGNOSIS — I639 Cerebral infarction, unspecified: Secondary | ICD-10-CM | POA: Diagnosis not present

## 2015-12-10 NOTE — Progress Notes (Signed)
Carelink Summary Report / Loop Recorder 

## 2015-12-12 ENCOUNTER — Encounter: Payer: Self-pay | Admitting: Cardiology

## 2015-12-15 ENCOUNTER — Telehealth: Payer: Self-pay | Admitting: *Deleted

## 2015-12-15 NOTE — Telephone Encounter (Signed)
Spoke to patient about pause episode from 2/19. Patient denied any symptoms from that date. He states that he is normally dizziness, but did not has not noticed any increased dizziness or syncope. Will inform Dr.Camnitz and notify patient if anything further is recommended.  Patient voiced understanding.

## 2015-12-26 LAB — CUP PACEART REMOTE DEVICE CHECK: Date Time Interrogation Session: 20170120203623

## 2015-12-31 ENCOUNTER — Encounter: Payer: Self-pay | Admitting: Neurology

## 2015-12-31 ENCOUNTER — Ambulatory Visit (INDEPENDENT_AMBULATORY_CARE_PROVIDER_SITE_OTHER): Payer: Medicare Other | Admitting: Neurology

## 2015-12-31 ENCOUNTER — Ambulatory Visit: Payer: Medicare Other | Admitting: Neurology

## 2015-12-31 VITALS — BP 124/83 | HR 96 | Ht 69.0 in | Wt 204.0 lb

## 2015-12-31 DIAGNOSIS — I639 Cerebral infarction, unspecified: Secondary | ICD-10-CM

## 2015-12-31 NOTE — Progress Notes (Signed)
[facility] [address] [place]. [place] [address] [phone]       OFFICE FOLLOW-UP NOTE  [name]. Date of Birth:  [birth year] Medical Record Number:  [id number]   HPI: Mr Travis [name] is a pleasant 82 year old Caucasian male seen today for the first office follow-up visit following hospital admission for stroke in September 2016.Princess Bruins Travis [name]. is an 82 y.o. male who was on his porch on  07/05/15 when he had acute onset nausea, vomiting and difficulty with gait. Initially patient did not want to go to the hospital but eventually was convinced. After admission with no improvement in symptoms patient was eventually convinced to have a MRI of the brain. Acute infarct diagnosed.  Wife reported patient had difficulty with balance for the past few weeks and has been experiencing some difficulty with memory.  Date last known well: Date: 07/05/2015 Time last known well: Time: 10:00 tPA Given: No: Outside time window . He was admitted to the neuro floor bed for further evaluation and treatment. 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 There is 30% stenosis of the dominant left vertebral artery origin and 70% stenosis of the nondominant right vertebral artery. 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 Carotid Doppler showed no extracranial stenosis. The right vertebral artery was not visualized and left showed antegrade flow. Transthoracic echo showed normal ejection fraction. LDL cholesterol was 76 kg percent. Hemoglobin A1c was 6.8. MRI scan showed a left posterior inferior cerebellar artery infarct. Telemetry monitoring did not reveal cardiac arrhythmias. Patient refused to undergo TEE. Loop recorder was placed and so far he to fibrillation has not been found. Patient states his done well since discharge is able to walk with a cane he still staggers a  bit particularly when he started to move quickly. His fortunately had no falls. He also has significant pain in his left knee and hip from arthritis which also limits his walking. He is tolerating Plavix well without significant bleeding and only minor bruising. His blood pressure is well controlled and today it is 129/78. His sugars also adequately controlled. He has no complaints today. Update 12/31/2015 : He returns for follow-up after last visit 3 months ago. He is accompanied by his wife. He continues to have balance difficulties and often loses his balance but is able to catch himself. He has not fortunately had any major falls or injuries. Remains on Plavix and does bruise easily but has not had any bleeding problems. Patient has decided not to participate in the Respect ESUS trial. He states his blood pressure is well controlled and it is 1-4/83 today. His sugars have also been well controlled. He has no new complaints today.he is concerned about the co-pay for his visit at the cardiology device clinic as he misunderstood that there would be no co-pay  ROS:   14 system review of systems is positive for  staggering, gait difficulty, balance problems, knee pain, hip pain all other systems negative  PMH:  Past Medical History  Diagnosis Date  . Hypertension   . PSA elevation   . Arthritis   . Pre-diabetes   . Chronic kidney disease     stone 1990  . Stroke [facility])     Social History:  Social History   Social History  . Marital Status: Married    Spouse Name: N/A  . Number of Children: N/A  .  Years of Education: N/A   Occupational History  . Not on file.   Social History Main Topics  . Smoking status: Former Smoker -- 2.00 packs/day for 18 years    Types: Cigarettes, Cigars    Quit date: 10/03/1962  . Smokeless tobacco: Former Systems developer    Types: Chew  . Alcohol Use: 0.6 oz/week    1 Shots of liquor per week     Comment: vodka socially  . Drug Use: No  . Sexual Activity: Not on  file   Other Topics Concern  . Not on file   Social History Narrative    Medications:   Current Outpatient Prescriptions on File Prior to Visit  Medication Sig Dispense Refill  . clopidogrel (PLAVIX) 75 MG tablet Take 1 tablet (75 mg total) by mouth daily. 30 tablet 3  . finasteride (PROSCAR) 5 MG tablet Take 5 mg by mouth daily.    . hydrochlorothiazide (HYDRODIURIL) 25 MG tablet     . lisinopril (PRINIVIL,ZESTRIL) 10 MG tablet Take 1 tablet (10 mg total) by mouth daily. 30 tablet 3  . metFORMIN (GLUCOPHAGE) 500 MG tablet Take 1 tablet (500 mg total) by mouth 2 (two) times daily with a meal. 60 tablet 0  . Polyvinyl Alcohol-Povidone (REFRESH OP) Place 1 drop into the right eye daily as needed. For dry eye per patient    . terazosin (HYTRIN) 10 MG capsule Take 10 mg by mouth at bedtime.     No current facility-administered medications on file prior to visit.    Allergies:  No Known Allergies  Physical Exam General: well developed, well nourished Elderly Caucasian male, seated, in no evident distress Head: head normocephalic and atraumatic.  Neck: supple with no carotid or supraclavicular bruits Cardiovascular: regular rate and rhythm, no murmurs Musculoskeletal: no deformity Skin:  no rash but scattered petichiae present Vascular:  Normal pulses all extremities Filed Vitals:   12/31/15 1055  BP: 124/83  Pulse: 96   Neurologic Exam Mental Status: Awake and fully alert. Oriented to place and time. Recent and remote memory intact. Attention span, concentration and fund of knowledge appropriate. Mood and affect appropriate.  Cranial Nerves: Fundoscopic exam not done   Pupils equal, briskly reactive to light. Extraocular movements full without nystagmus. Visual fields full to confrontation. Hearing intact. Facial sensation intact. Face, tongue, palate moves normally and symmetrically.  Motor: Normal bulk and tone. Normal strength in all tested extremity muscles. Sensory.: intact  to touch ,pinprick .position and vibratory sensation.  Coordination: Rapid alternating movements normal in all extremities. Finger-to-nose and heel-to-shin performed accurately bilaterally. Gait and Station: Arises from chair without difficulty. Stance is normal. Gait demonstrates normal stride length and balance .But favoring right knee due to pain  Reflexes: 1+ and symmetric. Toes downgoing.   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 Vascular risk factors of diabetes, hypertension and borderline hyperlipidemia    PLAN:  had a long d/w patient and wife about his recent stroke, risk for recurrent stroke/TIAs, personally independently reviewed imaging studies and stroke evaluation results and answered questions.Continue Plavix for secondary stroke prevention and maintain strict control of hypertension with blood pressure goal below 130/90, diabetes with hemoglobin A1c goal below 6.5% and lipids with LDL cholesterol goal below 70 mg/dL. I also advised the patient to eat a healthy diet with plenty of whole grains, cereals, fruits and vegetables, exercise regularly and maintain  ideal body weight Followup in the future with stroke nurse practitioner in one year or call earlier if necessary   Antony Contras, MD  Note: This document was prepared with digital dictation and possible smart phrase technology. Any transcriptional errors that result from this process are unintentional

## 2015-12-31 NOTE — Patient Instructions (Signed)
I had a long d/w patient and wife about his recent stroke, risk for recurrent stroke/TIAs, personally independently reviewed imaging studies and stroke evaluation results and answered questions.Continue Plavix for secondary stroke prevention and maintain strict control of hypertension with blood pressure goal below 130/90, diabetes with hemoglobin A1c goal below 6.5% and lipids with LDL cholesterol goal below 70 mg/dL. I also advised the patient to eat a healthy diet with plenty of whole grains, cereals, fruits and vegetables, exercise regularly and maintain ideal body weight Followup in the future with stroke nurse practitioner in one year or call earlier if necessary

## 2016-01-06 ENCOUNTER — Ambulatory Visit (INDEPENDENT_AMBULATORY_CARE_PROVIDER_SITE_OTHER): Payer: Medicare Other | Admitting: *Deleted

## 2016-01-06 DIAGNOSIS — I639 Cerebral infarction, unspecified: Secondary | ICD-10-CM

## 2016-01-07 NOTE — Progress Notes (Signed)
Carelink Summary Report / Loop Recorder 

## 2016-01-22 ENCOUNTER — Encounter: Payer: Medicare Other | Admitting: Cardiology

## 2016-01-29 NOTE — Progress Notes (Signed)
Electrophysiology Office Note   Date:  01/29/2016   ID:  Travis Rocks., DOB July 01, 1930, MRN [id number]  PCP:  Travis Downing, MD   Primary Electrophysiologist:  Travis Haw, MD    No chief complaint on file.    History of Present Illness: Travis Herne. is a 82 y.o. male who presents today for electrophysiology evaluation.   He presents today for follow-up of his Linq monitor. He had a cryptogenic stroke in September 2016.his symptoms at that time were nausea, vomiting, and gait difficulties. He had also been having difficulty with memory. At the time he was in sinus rhythm and had an echocardiogram which showed a normal EF and no wall motion abnormalities. He does continue to feel dizzy. He is able to work in the yard and do activities around the house, and his dizziness is only been mild. He is recovering from his stroke and doing well and not having any major complaints.   Today, he denies symptoms of palpitations, chest pain, shortness of breath, orthopnea, PND, lower extremity edema, claudication, presyncope, syncope, bleeding, or neurologic sequela. The patient is tolerating medications without difficulties and is otherwise without complaint today.    Past Medical History  Diagnosis Date  . Hypertension   . PSA elevation   . Arthritis   . Pre-diabetes   . Chronic kidney disease     stone 1990  . Stroke [facility])    Past Surgical History  Procedure Laterality Date  . Knee surgery    . Replacement total knee  11/20/10    left  . Joint replacement    . Inguinal hernia repair  10/06/2011    Procedure: HERNIA REPAIR INGUINAL ADULT;  Surgeon: Travis Bowie, MD;  Location: [facility];  Service: General;  Laterality: Left;  Open Left Inguinal Hernia Repair with Mesh  . Umbilical hernia repair  10/06/2011    Procedure: HERNIA REPAIR UMBILICAL ADULT;  Surgeon: Travis Bowie, MD;  Location: [facility];  Service: General;  Laterality: N/A;  . Ep implantable  device N/A 07/10/2015    Procedure: Loop Recorder Insertion;  Surgeon: Travis Dall Meredith Leeds, MD;  Location: [place] [facility];  Service: Cardiovascular;  Laterality: N/A;     Current Outpatient Prescriptions  Medication Sig Dispense Refill  . clopidogrel (PLAVIX) 75 MG tablet Take 1 tablet (75 mg total) by mouth daily. 30 tablet 3  . finasteride (PROSCAR) 5 MG tablet Take 5 mg by mouth daily.    . hydrochlorothiazide (HYDRODIURIL) 25 MG tablet     . lisinopril (PRINIVIL,ZESTRIL) 10 MG tablet Take 1 tablet (10 mg total) by mouth daily. 30 tablet 3  . metFORMIN (GLUCOPHAGE) 500 MG tablet Take 1 tablet (500 mg total) by mouth 2 (two) times daily with a meal. 60 tablet 0  . Polyvinyl Alcohol-Povidone (REFRESH OP) Place 1 drop into the right eye daily as needed. For dry eye per patient    . terazosin (HYTRIN) 10 MG capsule Take 10 mg by mouth at bedtime.     No current facility-administered medications for this visit.    Allergies:   Review of patient's allergies indicates no known allergies.   Social History:  The patient  reports that he quit smoking about 53 years ago. His smoking use included Cigarettes and Cigars. He has a 36 pack-year smoking history. He has quit using smokeless tobacco. His smokeless tobacco use included Chew. He reports that he drinks about 0.6 oz of alcohol per week. He  reports that he does not use illicit drugs.   Family History:  The patient's family history includes Heart disease in his father.    ROS:  Please see the history of present illness.   All other systems are reviewed and positive for palpitations, diarrhea, balance problems, dizziness.    PHYSICAL EXAM: VS:  There were no vitals taken for this visit. , BMI There is no weight on file to calculate BMI. GEN: Well nourished, well developed, in no acute distress HEENT: normal Neck: no JVD, carotid bruits, or masses Cardiac: RRR; no murmurs, rubs, or gallops,no edema  Respiratory:  clear to auscultation  bilaterally, normal work of breathing GI: soft, nontender, nondistended, + BS MS: no deformity or atrophy Skin: warm and dry,  device pocket is well healed Neuro:  Strength and sensation are intact Psych: euthymic mood, full affect  EKG:  EKG is ordered today. ECG today shows Sinus rhythm, RBBB, LAFB, rate 68   Device interrogation is reviewed today in detail.  See PaceArt for details.   Recent Labs: 07/05/2015: ALT 34 07/07/2015: Hemoglobin 12.4*; Platelets 193 07/11/2015: BUN 8; Creatinine, Ser 1.09; Magnesium 2.0; Potassium 3.3*; Sodium 137    Lipid Panel     Component Value Date/Time   CHOL 138 07/09/2015 0405   TRIG 124 07/09/2015 0405   HDL 37* 07/09/2015 0405   CHOLHDL 3.7 07/09/2015 0405   VLDL 25 07/09/2015 0405   LDLCALC 76 07/09/2015 0405     Wt Readings from Last 3 Encounters:  12/31/15 204 lb (92.534 kg)  10/24/15 209 lb 6.4 oz (94.983 kg)  09/22/15 210 lb 9.6 oz (95.528 kg)      Other studies Reviewed: Additional studies/ records that were reviewed today include: TTE 07/08/15 Review of the above records today demonstrates:  - Left ventricle: The cavity size was normal. Systolic function was normal. The estimated ejection fraction was in the range of 55% to 60%. Wall motion was normal; there were no regional wall motion abnormalities. - Aortic valve: Trileaflet; mildly thickened, mildly calcified leaflets. There was mild regurgitation. - Aorta: Aortic root dimension: 40 mm (ED). Ascending aorta diameter: 42 mm (ED). - Aortic root: The aortic root was mildly dilated. - Ascending aorta: The ascending aorta was mildly dilated.    ASSESSMENT AND PLAN:  1.  Cryptogenic stroke: No further symptoms of stroke. He is feeling well and recovering well. Linq monitor shows no evidence of atrial fibrillation.  2. Dizziness: At this time he is dizzy most of the day. He says that his dizziness is only mild. His monitor did show episodes of SVT as well as  pauses up to 5 seconds. Some of these pauses are occurring when he is sleeping. He does not know that he is having pauses. We'll continue to monitor. I did discuss with him the option of pacemaker and he would like to hold off for the moment.  Current medicines are reviewed at length with the patient today.   The patient does not have concerns regarding his medicines.  The following changes were made today:    Labs/ tests ordered today include:  No orders of the defined types were placed in this encounter.     Disposition:   FU with Birt Reinoso 1 year  Signed, Atonya Templer Meredith Leeds, MD  01/29/2016 9:26 PM     [place] [address] [phone] (office) [phone] (fax)

## 2016-01-30 ENCOUNTER — Encounter: Payer: Self-pay | Admitting: Cardiology

## 2016-01-30 ENCOUNTER — Ambulatory Visit (INDEPENDENT_AMBULATORY_CARE_PROVIDER_SITE_OTHER): Payer: Medicare Other | Admitting: Cardiology

## 2016-01-30 VITALS — BP 120/62 | HR 68 | Ht 69.0 in | Wt 205.2 lb

## 2016-01-30 DIAGNOSIS — I639 Cerebral infarction, unspecified: Secondary | ICD-10-CM | POA: Diagnosis not present

## 2016-01-30 LAB — CUP PACEART INCLINIC DEVICE CHECK: MDC IDC SESS DTM: 20170414115142

## 2016-01-30 MED ORDER — TERAZOSIN HCL 10 MG PO CAPS: 10.0000 mg | ORAL_CAPSULE | Freq: Every day | ORAL | Status: AC

## 2016-01-30 MED ORDER — LISINOPRIL 10 MG PO TABS: 10.0000 mg | ORAL_TABLET | Freq: Every day | ORAL | Status: DC

## 2016-01-30 MED ORDER — CLOPIDOGREL BISULFATE 75 MG PO TABS: 75.0000 mg | ORAL_TABLET | Freq: Every day | ORAL | Status: DC

## 2016-01-30 MED ORDER — HYDROCHLOROTHIAZIDE 25 MG PO TABS: 25.0000 mg | ORAL_TABLET | Freq: Every day | ORAL | Status: DC

## 2016-01-30 NOTE — Patient Instructions (Signed)
Medication Instructions:  Your physician recommends that you continue on your current medications as directed. Please refer to the Current Medication list given to you today.   Labwork: None ordered   Testing/Procedures: None ordered   Follow-Up: Your physician wants you to follow-up in: 12 months with Dr Cora Daniels will receive a reminder letter in the mail two months in advance. If you don't receive a letter, please call our office to schedule the follow-up appointment.   Any Other Special Instructions Will Be Listed Below (If Applicable).     If you need a refill on your cardiac medications before your next appointment, please call your pharmacy.

## 2016-02-05 ENCOUNTER — Ambulatory Visit (INDEPENDENT_AMBULATORY_CARE_PROVIDER_SITE_OTHER): Payer: Medicare Other | Admitting: *Deleted

## 2016-02-05 DIAGNOSIS — I639 Cerebral infarction, unspecified: Secondary | ICD-10-CM | POA: Diagnosis not present

## 2016-02-06 NOTE — Progress Notes (Signed)
Carelink Summary Report / Loop Recorder 

## 2016-02-20 LAB — CUP PACEART REMOTE DEVICE CHECK: MDC IDC SESS DTM: 20170219203648

## 2016-03-08 ENCOUNTER — Ambulatory Visit (INDEPENDENT_AMBULATORY_CARE_PROVIDER_SITE_OTHER): Payer: Medicare Other | Admitting: *Deleted

## 2016-03-08 DIAGNOSIS — I639 Cerebral infarction, unspecified: Secondary | ICD-10-CM

## 2016-03-09 NOTE — Progress Notes (Signed)
Carelink Summary Report / Loop Recorder 

## 2016-03-13 LAB — CUP PACEART REMOTE DEVICE CHECK: MDC IDC SESS DTM: 20170321204133

## 2016-03-15 LAB — CUP PACEART REMOTE DEVICE CHECK: Date Time Interrogation Session: 20170420210629

## 2016-03-15 NOTE — Progress Notes (Signed)
Carelink summary report received. Battery status OK. Normal device function. No new symptom episodes, brady episodes. No new AF episodes. 1 tachy episode-SVT, 5 pause episodes - reviewed w patient at appt 01/30/16. Monthly summary reports and ROV/PRN

## 2016-03-19 ENCOUNTER — Other Ambulatory Visit: Payer: Self-pay | Admitting: Cardiology

## 2016-04-01 ENCOUNTER — Other Ambulatory Visit: Payer: Self-pay | Admitting: Surgery

## 2016-04-05 ENCOUNTER — Ambulatory Visit (INDEPENDENT_AMBULATORY_CARE_PROVIDER_SITE_OTHER): Payer: Medicare Other | Admitting: *Deleted

## 2016-04-05 DIAGNOSIS — I639 Cerebral infarction, unspecified: Secondary | ICD-10-CM | POA: Diagnosis not present

## 2016-04-06 NOTE — Progress Notes (Signed)
Carelink Summary Report / Loop Recorder 

## 2016-04-17 LAB — CUP PACEART REMOTE DEVICE CHECK
MDC IDC SESS DTM: 20170520210742
MDC IDC SESS DTM: 20170619211057

## 2016-05-05 ENCOUNTER — Ambulatory Visit (INDEPENDENT_AMBULATORY_CARE_PROVIDER_SITE_OTHER): Payer: Medicare Other | Admitting: *Deleted

## 2016-05-05 DIAGNOSIS — I639 Cerebral infarction, unspecified: Secondary | ICD-10-CM | POA: Diagnosis not present

## 2016-05-06 NOTE — Progress Notes (Signed)
Carelink Summary Report / Loop Recorder 

## 2016-06-04 ENCOUNTER — Ambulatory Visit (INDEPENDENT_AMBULATORY_CARE_PROVIDER_SITE_OTHER): Payer: Medicare Other | Admitting: *Deleted

## 2016-06-04 DIAGNOSIS — I639 Cerebral infarction, unspecified: Secondary | ICD-10-CM

## 2016-06-04 LAB — CUP PACEART REMOTE DEVICE CHECK: MDC IDC SESS DTM: 20170719213733

## 2016-06-07 NOTE — Progress Notes (Signed)
Carelink Summary Report / Loop Report 

## 2016-06-30 LAB — CUP PACEART REMOTE DEVICE CHECK: MDC IDC SESS DTM: 20170818213850

## 2016-07-05 ENCOUNTER — Ambulatory Visit (INDEPENDENT_AMBULATORY_CARE_PROVIDER_SITE_OTHER): Payer: Medicare Other | Admitting: *Deleted

## 2016-07-05 DIAGNOSIS — I639 Cerebral infarction, unspecified: Secondary | ICD-10-CM | POA: Diagnosis not present

## 2016-07-05 NOTE — Progress Notes (Signed)
Carelink Summary Report / Loop Recorder 

## 2016-07-12 ENCOUNTER — Encounter: Payer: Self-pay | Admitting: Cardiology

## 2016-07-30 LAB — CUP PACEART REMOTE DEVICE CHECK: Date Time Interrogation Session: 20170917220605

## 2016-08-03 ENCOUNTER — Ambulatory Visit (INDEPENDENT_AMBULATORY_CARE_PROVIDER_SITE_OTHER): Payer: Medicare Other | Admitting: *Deleted

## 2016-08-03 DIAGNOSIS — I639 Cerebral infarction, unspecified: Secondary | ICD-10-CM | POA: Diagnosis not present

## 2016-08-04 NOTE — Progress Notes (Signed)
Carelink Summary Report / Loop Recorder 

## 2016-09-02 ENCOUNTER — Ambulatory Visit (INDEPENDENT_AMBULATORY_CARE_PROVIDER_SITE_OTHER): Payer: Medicare Other | Admitting: *Deleted

## 2016-09-02 DIAGNOSIS — I639 Cerebral infarction, unspecified: Secondary | ICD-10-CM

## 2016-09-03 NOTE — Progress Notes (Signed)
Carelink Summary Report / Loop Recorder 

## 2016-09-04 LAB — CUP PACEART REMOTE DEVICE CHECK
Implantable Pulse Generator Implant Date: 20160922
MDC IDC SESS DTM: 20171018000819

## 2016-09-04 NOTE — Progress Notes (Signed)
Carelink summary report received. Battery status OK. Normal device function. No new symptom episodes, brady  episodes. No new AF episodes. 6 tachy episodes, previously reviewed.  2 pause episodes. Monthly summary reports and ROV/PRN

## 2016-09-16 ENCOUNTER — Encounter: Payer: Self-pay | Admitting: *Deleted

## 2016-09-16 ENCOUNTER — Telehealth: Payer: Self-pay | Admitting: *Deleted

## 2016-09-16 NOTE — Telephone Encounter (Signed)
Spoke with patient, assisted him in sending manual transmission for review.  [employer] Carelink website is currently down, will check back tomorrow for transmission.  Patient reports he does not have a symptom activator, but he does report pre-syncopal symptoms correlating with yawning.  He has not experienced full syncope.  Advised patient that I will order him a new symptom activator from [employer] customer service, and that I will mail him instructions for using it.  Patient is agreeable to this plan.  He verbalizes understanding of instructions to use symptom activator for presycope or syncope not requiring urgent medical assistance.  Patient verbalizes understanding of information that symptom activator does not replace calling 911 (if emergent).  Patient activator ordered, should arrive in 7-10 business days.  Instructions and info letter mailed to patient's home address.  Reviewed with Dr. Conception Chancy should be seen in office to discuss frequent pauses.  Attempted to reach patient, no answer, no VM set up.  Will try again tomorrow.

## 2016-09-22 NOTE — Telephone Encounter (Signed)
LM with patient's wife requesting that patient call back.  [facility] phone number for return call.  Per Dr. Curt Bears, patient should schedule appointment to see him regarding pauses on LINQ.  Spoke with patient and wife.  Patient is agreeable to appointment on 09/27/16 at 3:15pm.  Patient is agreeable to calling the [facility] in the interim if he has any questions, concerns, or presyncopal symptoms.  He is appreciative of call and denies additional questions or concerns at this time.

## 2016-09-24 ENCOUNTER — Encounter: Payer: Self-pay | Admitting: Cardiology

## 2016-09-27 ENCOUNTER — Ambulatory Visit (INDEPENDENT_AMBULATORY_CARE_PROVIDER_SITE_OTHER): Payer: Medicare Other | Admitting: Cardiology

## 2016-09-27 VITALS — BP 139/67 | Ht 69.0 in | Wt 208.0 lb

## 2016-09-27 DIAGNOSIS — I639 Cerebral infarction, unspecified: Secondary | ICD-10-CM

## 2016-09-27 LAB — CUP PACEART INCLINIC DEVICE CHECK
Date Time Interrogation Session: 20171211172032
Implantable Pulse Generator Implant Date: 20160922

## 2016-09-27 NOTE — Patient Instructions (Addendum)
Medication Instructions:  Your physician recommends that you continue on your current medications as directed. Please refer to the Current Medication list given to you today.  * If you need a refill on your cardiac medications before your next appointment, please call your pharmacy.   Labwork: None ordered  Testing/Procedures: None ordered  Follow-Up: No follow up is needed at this time with Dr. Camnitz.  He will see you on an as needed basis.   Thank you for choosing [facility]!!   Saliyah Gillin, RN [phone]     

## 2016-09-27 NOTE — Progress Notes (Signed)
Electrophysiology Office Note   Date:  09/27/2016   ID:  Travis Rocks., DOB 18-Apr-1930, MRN [id number]  PCP:  Leonard Downing, MD   Primary Electrophysiologist:  Constance Haw, MD    Chief Complaint  Patient presents with  . Dizziness     History of Present Illness: Travis Shor. is a 82 y.o. male who presents today for electrophysiology evaluation.   He presents today for follow-up of his Linq monitor. He had a cryptogenic stroke in September 2016.his symptoms at that time were nausea, vomiting, and gait difficulties. He had also been having difficulty with memory. At the time he was in sinus rhythm and had an echocardiogram which showed a normal EF and no wall motion abnormalities. He does continue to feel dizzy. He is able to work in the yard and do activities around the house, and his dizziness is only been mild. He is recovering from his stroke and doing well and not having any major complaints.   Today, he denies symptoms of palpitations, chest pain, shortness of breath, orthopnea, PND, lower extremity edema, claudication, presyncope, syncope, bleeding, or neurologic sequela. The patient is tolerating medications without difficulties and is otherwise without complaint today.    Past Medical History:  Diagnosis Date  . Arthritis   . Chronic kidney disease    stone 1990  . Hypertension   . Pre-diabetes   . PSA elevation   . Stroke [facility])    Past Surgical History:  Procedure Laterality Date  . EP IMPLANTABLE DEVICE N/A 07/10/2015   Procedure: Loop Recorder Insertion;  Surgeon: Lavene Penagos Meredith Leeds, MD;  Location: [facility];  Service: Cardiovascular;  Laterality: N/A;  . INGUINAL HERNIA REPAIR  10/06/2011   Procedure: HERNIA REPAIR INGUINAL ADULT;  Surgeon: Harl Bowie, MD;  Location: [facility];  Service: General;  Laterality: Left;  Open Left Inguinal Hernia Repair with Mesh  . JOINT REPLACEMENT    . KNEE SURGERY    . REPLACEMENT TOTAL  KNEE  11/20/10   left  . UMBILICAL HERNIA REPAIR  10/06/2011   Procedure: HERNIA REPAIR UMBILICAL ADULT;  Surgeon: Harl Bowie, MD;  Location: [facility];  Service: General;  Laterality: N/A;     Current Outpatient Prescriptions  Medication Sig Dispense Refill  . clopidogrel (PLAVIX) 75 MG tablet Take 1 tablet by mouth  daily 90 tablet 3  . finasteride (PROSCAR) 5 MG tablet Take 5 mg by mouth daily.    . hydrochlorothiazide (HYDRODIURIL) 25 MG tablet Take 1 tablet (25 mg total) by mouth daily. 90 tablet 3  . lisinopril (PRINIVIL,ZESTRIL) 10 MG tablet Take 1 tablet (10 mg total) by mouth daily. 90 tablet 3  . metFORMIN (GLUCOPHAGE) 500 MG tablet Take 1 tablet (500 mg total) by mouth 2 (two) times daily with a meal. 60 tablet 0  . Polyvinyl Alcohol-Povidone (REFRESH OP) Place 1 drop into the right eye daily as needed. For dry eye per patient    . terazosin (HYTRIN) 10 MG capsule Take 1 capsule (10 mg total) by mouth at bedtime. 90 capsule 3   No current facility-administered medications for this visit.     Allergies:   Patient has no known allergies.   Social History:  The patient  reports that he quit smoking about 54 years ago. His smoking use included Cigarettes and Cigars. He has a 36.00 pack-year smoking history. He has quit using smokeless tobacco. His smokeless tobacco use included Chew. He reports that he  drinks about 0.6 oz of alcohol per week . He reports that he does not use drugs.   Family History:  The patient's family history includes Heart disease in his father.    ROS:  Please see the history of present illness.   All other systems are reviewed and positive for diarrhea, difficulty uriniating.    PHYSICAL EXAM: VS:  BP 139/67 (BP Location: Left Arm, Patient Position: Sitting)   Ht 5\' 9"  (1.753 m)   Wt 208 lb (94.3 kg)   BMI 30.72 kg/m  , BMI Body mass index is 30.72 kg/m. GEN: Well nourished, well developed, in no acute distress  HEENT: normal  Neck: no JVD,  carotid bruits, or masses Cardiac: RRR; no murmurs, rubs, or gallops,no edema  Respiratory:  clear to auscultation bilaterally, normal work of breathing GI: soft, nontender, nondistended, + BS MS: no deformity or atrophy  Skin: warm and dry,  device pocket is well healed Neuro:  Strength and sensation are intact Psych: euthymic mood, full affect  EKG:  EKG is not ordered today. Personal review of the ECG ordered 01/30/16 shows Sinus rhythm, RBBB, LAFB, rate 68   Device interrogation is reviewed today in detail.  See PaceArt for details.   Recent Labs: No results found for requested labs within last 8760 hours.    Lipid Panel     Component Value Date/Time   CHOL 138 07/09/2015 0405   TRIG 124 07/09/2015 0405   HDL 37 (L) 07/09/2015 0405   CHOLHDL 3.7 07/09/2015 0405   VLDL 25 07/09/2015 0405   LDLCALC 76 07/09/2015 0405     Wt Readings from Last 3 Encounters:  09/27/16 208 lb (94.3 kg)  01/30/16 205 lb 3.2 oz (93.1 kg)  12/31/15 204 lb (92.5 kg)      Other studies Reviewed: Additional studies/ records that were reviewed today include: TTE 07/08/15 Review of the above records today demonstrates:  - Left ventricle: The cavity size was normal. Systolic function was normal. The estimated ejection fraction was in the range of 55% to 60%. Wall motion was normal; there were no regional wall motion abnormalities. - Aortic valve: Trileaflet; mildly thickened, mildly calcified leaflets. There was mild regurgitation. - Aorta: Aortic root dimension: 40 mm (ED). Ascending aorta diameter: 42 mm (ED). - Aortic root: The aortic root was mildly dilated. - Ascending aorta: The ascending aorta was mildly dilated.    ASSESSMENT AND PLAN:  1.  Cryptogenic stroke: No further symptoms of stroke. He is feeling well and recovering well. Linq monitor shows no evidence of atrial fibrillation.  2. Dizziness: At this time he is dizzy most of the day. He says that his dizziness is  only mild. His monitor did show episodes of SVT as well as pauses up to 5 seconds. He says that he does not feel like the pauses are concerning for his symptoms. He says that they're happening when he is sleeping. He does not want any further investigation into the pauses. He feels like his dizziness is likely due to vertigo.  Current medicines are reviewed at length with the patient today.   The patient does not have concerns regarding his medicines.  The following changes were made today:    Labs/ tests ordered today include:  No orders of the defined types were placed in this encounter.    Disposition:   FU with Para Cossey 1 year  Signed, Phuc Kluttz Meredith Leeds, MD  09/27/2016 3:52 PM     [place] [id number]  [address] [place] [address] [phone] (office) [phone] (fax)

## 2016-10-01 ENCOUNTER — Ambulatory Visit (INDEPENDENT_AMBULATORY_CARE_PROVIDER_SITE_OTHER): Payer: Medicare Other | Admitting: *Deleted

## 2016-10-01 ENCOUNTER — Other Ambulatory Visit: Payer: Self-pay | Admitting: Cardiology

## 2016-10-01 DIAGNOSIS — I639 Cerebral infarction, unspecified: Secondary | ICD-10-CM

## 2016-10-04 NOTE — Progress Notes (Signed)
Carelink Summary Report / Loop Recorder 

## 2016-10-14 LAB — CUP PACEART REMOTE DEVICE CHECK
Date Time Interrogation Session: 20171117013534
MDC IDC PG IMPLANT DT: 20160922

## 2016-11-01 ENCOUNTER — Ambulatory Visit (INDEPENDENT_AMBULATORY_CARE_PROVIDER_SITE_OTHER): Payer: Medicare Other | Admitting: *Deleted

## 2016-11-01 DIAGNOSIS — I639 Cerebral infarction, unspecified: Secondary | ICD-10-CM

## 2016-11-02 NOTE — Progress Notes (Signed)
Carelink Summary Report 

## 2016-11-11 IMAGING — CT CT HEAD W/O CM
2 series · 15 of 30 positions shown, 19 images · non-contrast
Comparison: None.

CLINICAL DATA: Vertigo

EXAM:
CT HEAD WITHOUT CONTRAST
TECHNIQUE: Contiguous axial images were obtained from the base of the skull
through the vertex without intravenous contrast.

[Series 201: head w/o, idose (1) · axial · non-contrast · 0.45mm/px · z∈[+97,+232]mm · 13 of 33 slices shown, 17 images]
[im 3/33  brain]
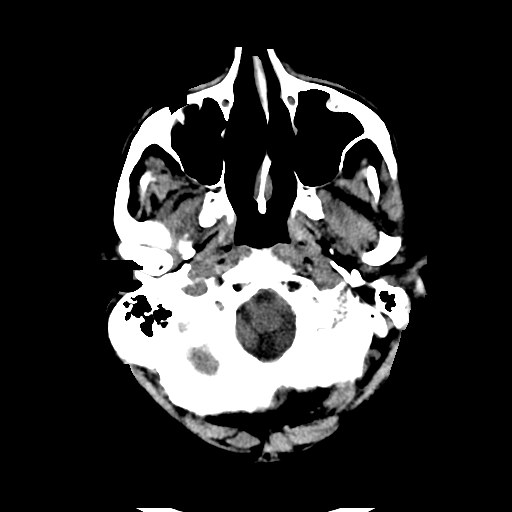
[im 3/33  bone]
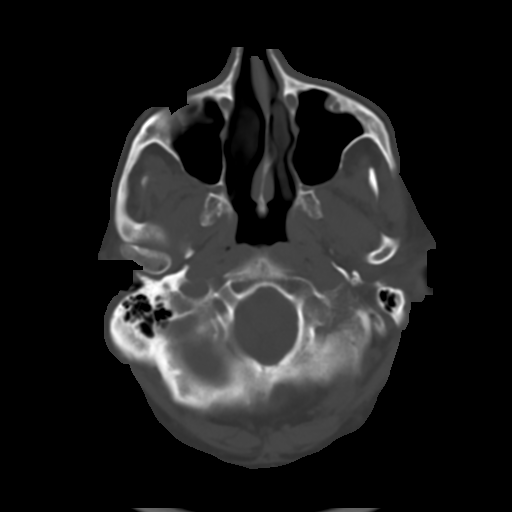
[im 5/33  brain]
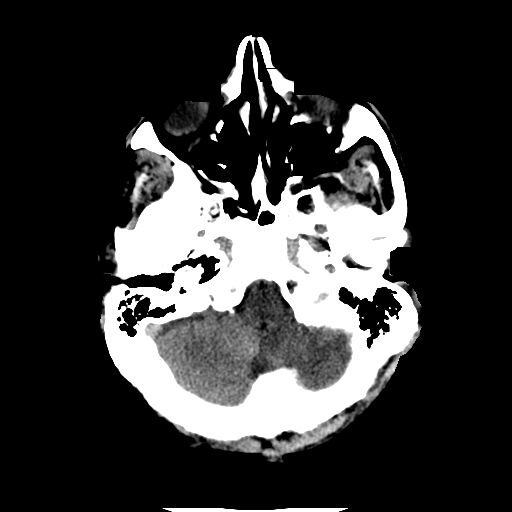
[im 7/33  brain]
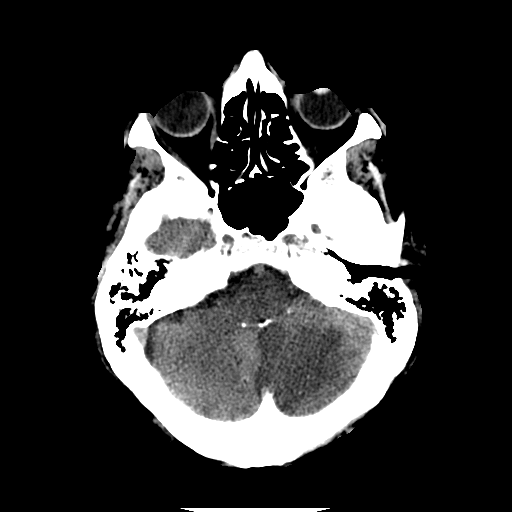
[im 10/33  brain]
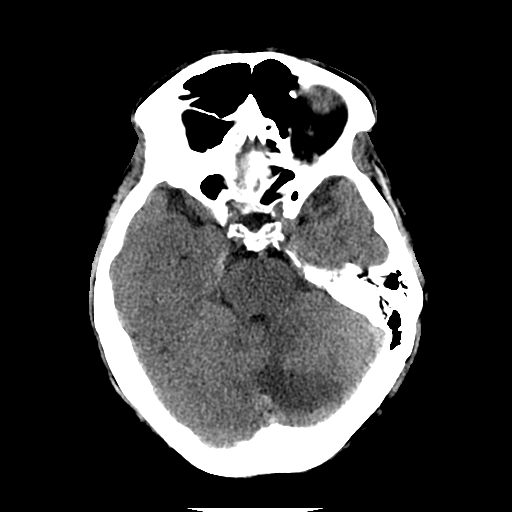
[im 12/33  brain]
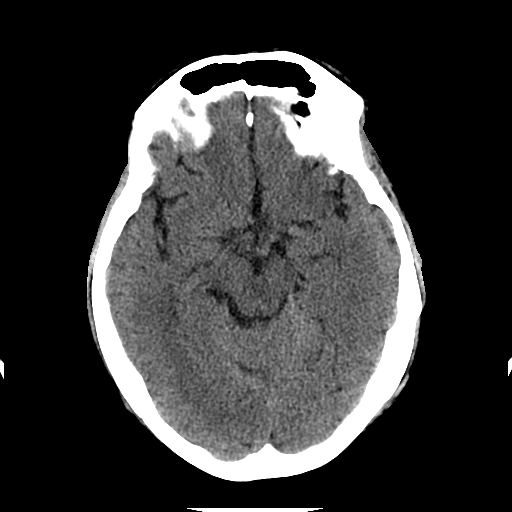
[im 12/33  bone]
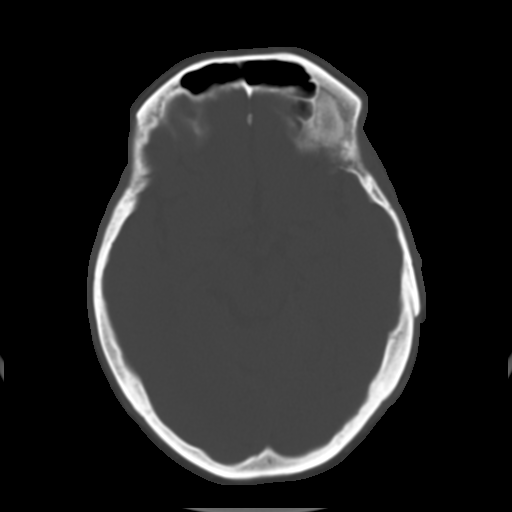
[im 14/33  brain]
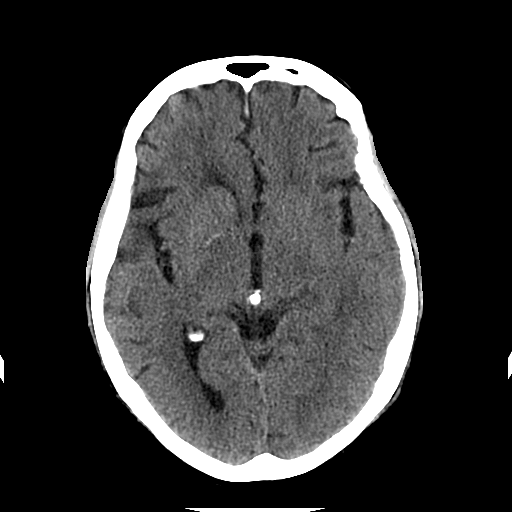
[im 17/33  brain]
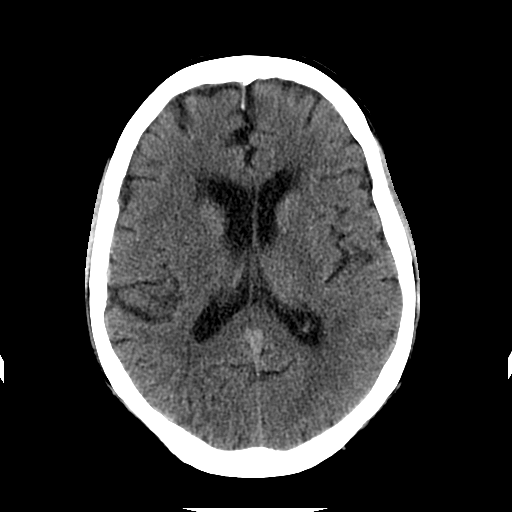
[im 19/33  brain]
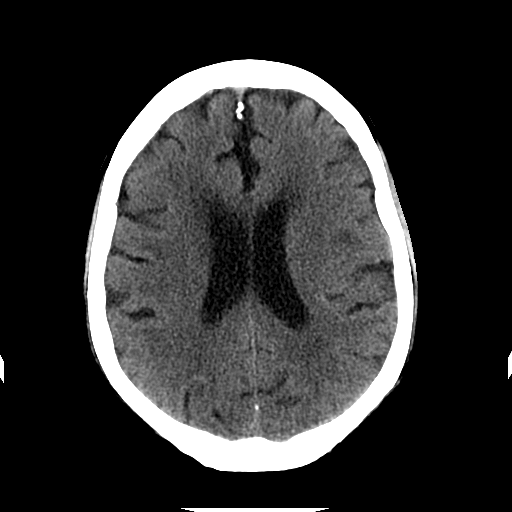
[im 21/33  brain]
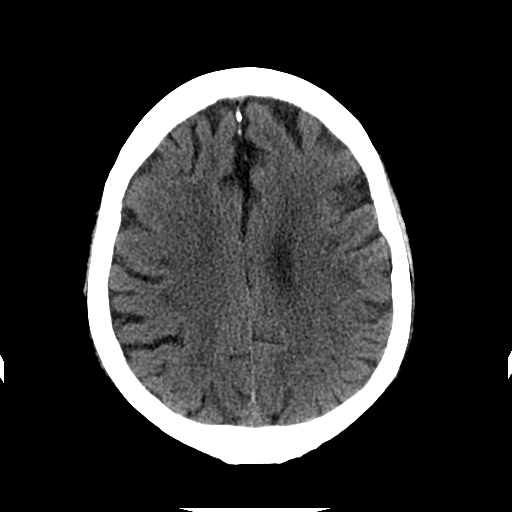
[im 21/33  bone]
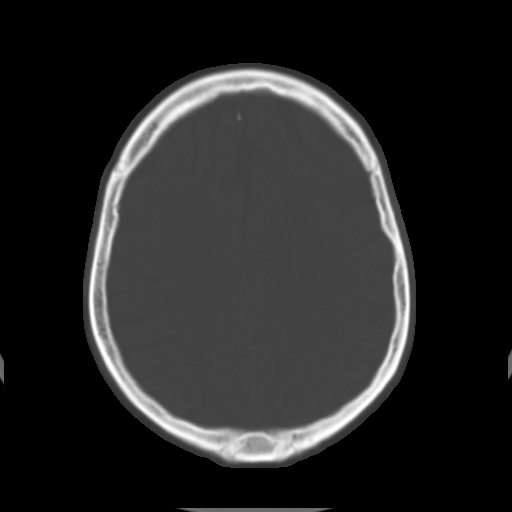
[im 23/33  brain]
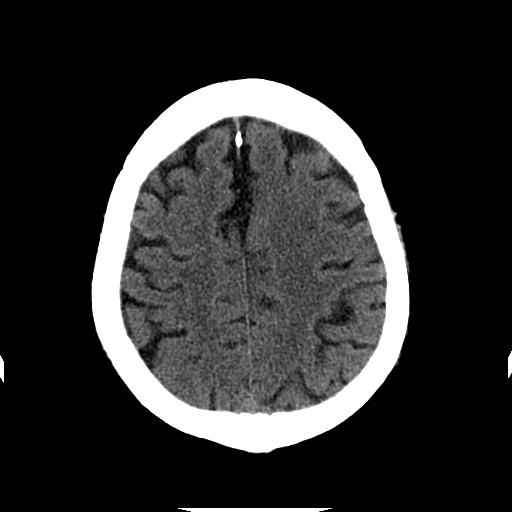
[im 26/33  brain]
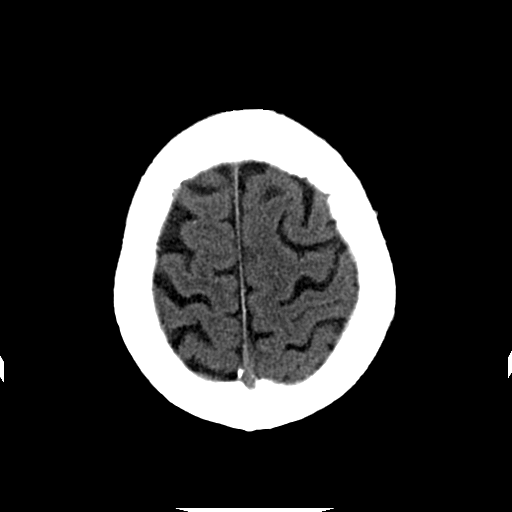
[im 28/33  brain]
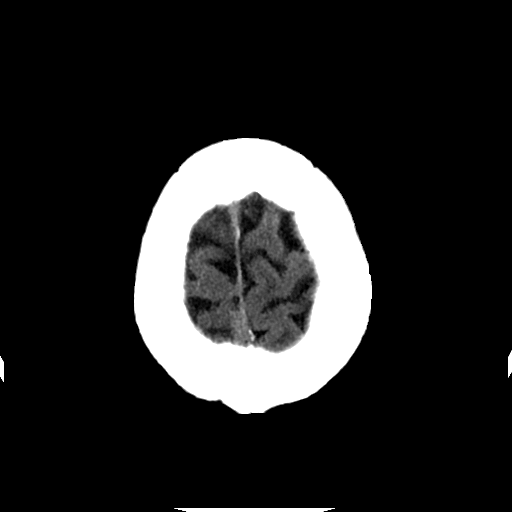
[im 30/33  brain]
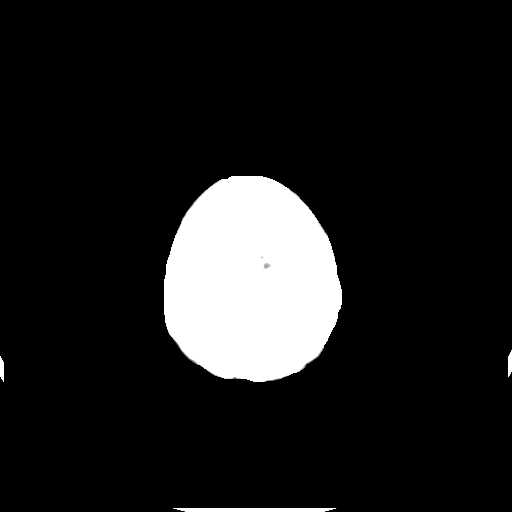
[im 30/33  bone]
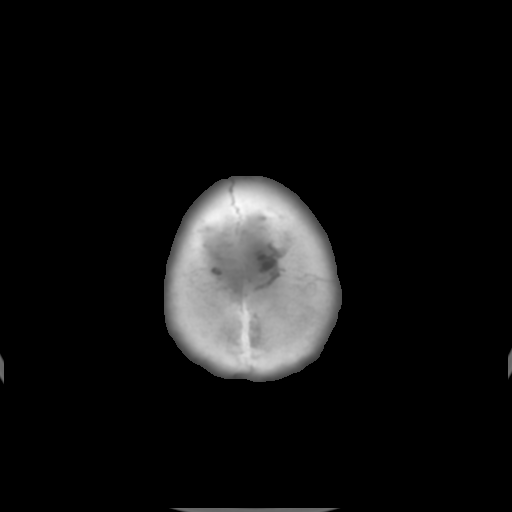

[Series 202: head w/o bone, idose (1) · axial · non-contrast · 0.45mm/px · z∈[+97,+117]mm · 2 of 33 slices shown]
[im 3/33  bone]
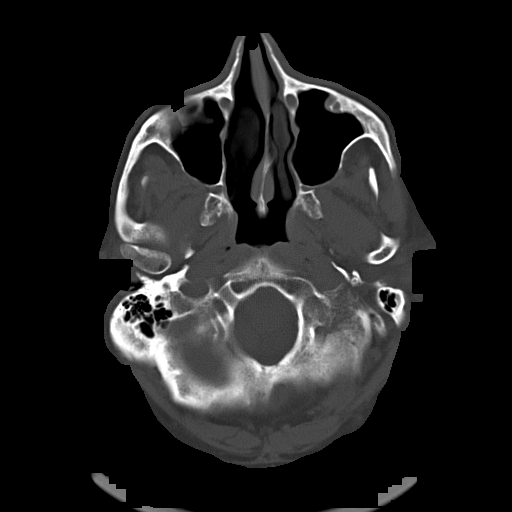
[im 7/33  bone]
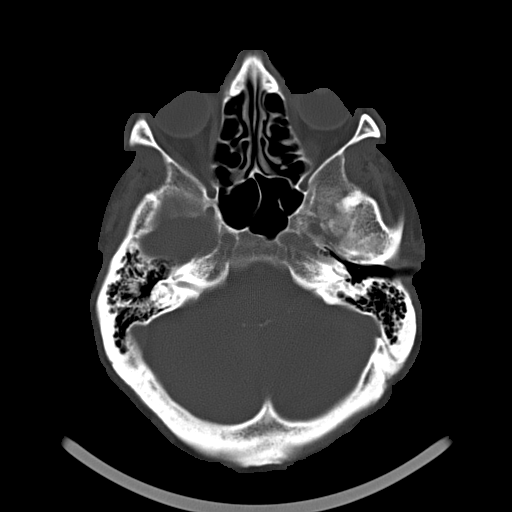

[15 of 30 positions shown; findings below may reference images not displayed]

FINDINGS: No skull fracture is noted. No intracranial hemorrhage or midline
shift. There is large area of decreased attenuation in the left
cerebellum highly suspicious for ischemic of indeterminate age.
Clinical correlation is necessary. Further correlation with MRI with
diffusion imaging is recommended. Mild cerebral atrophy.
Periventricular patchy subcortical white matter decreased
attenuation probable due to chronic small vessel ischemic changes.
IMPRESSION: 1. There is area of decreased attenuation in left cerebellum highly
suspicious for infarcts of indeterminate age. Subacute infarct
cannot be excluded. Clinical correlation is necessary. Further
correlation with MRI is recommended as clinically warranted. No
intracranial hemorrhage or midline shift. Mild cerebral atrophy.
Periventricular and patchy subcortical white matter decreased
attenuation probable due to chronic small vessel ischemic changes.

## 2016-11-11 IMAGING — CT CT ABD-PELV W/ CM
2 of 5 series · 16 of 46 positions shown, 18 images · IV contrast (APPLIED)
Comparison: 01/07/2012

CLINICAL DATA: New onset nausea and vomiting. Diffuse abdominal
tenderness. Rule out bowel obstruction.

EXAM:
CT ABDOMEN AND PELVIS WITH CONTRAST
TECHNIQUE: Multidetector CT imaging of the abdomen and pelvis was performed
using the standard protocol following bolus administration of
intravenous contrast.
CONTRAST:  100mL OMNIPAQUE IOHEXOL 300 MG/ML  SOLN

[Series 2: abd/ pelvis 5.0 i30f 1 · axial · 0.87mm/px · z∈[+727,+1207]mm · 13 of 109 slices shown, 15 images]
[im 7/109  soft-tissue]
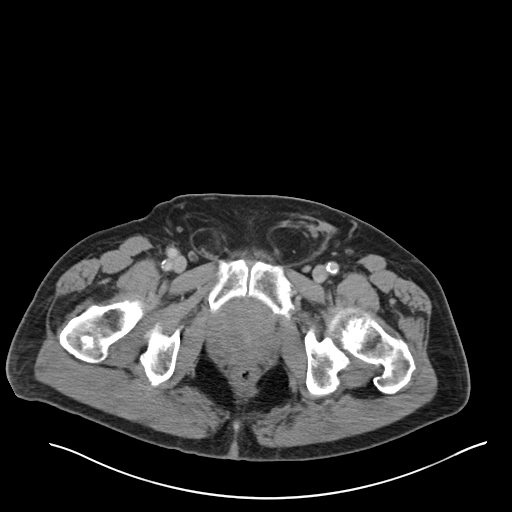
[im 7/109  bone]
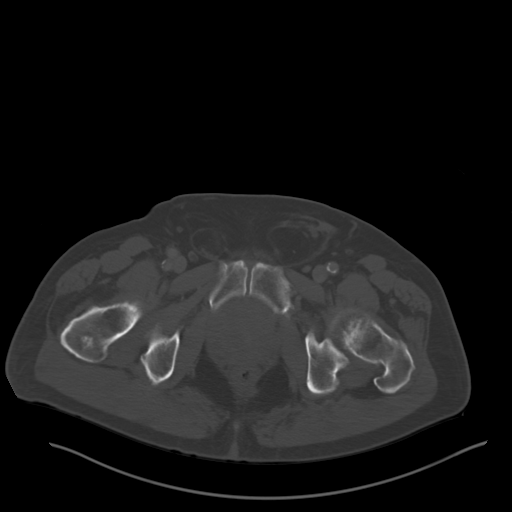
[im 13/109  soft-tissue]
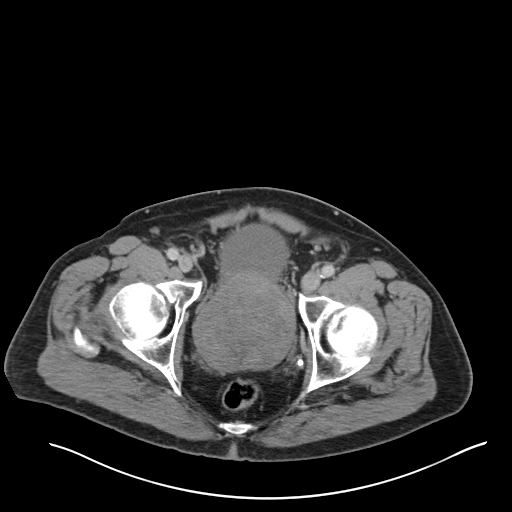
[im 25/109  soft-tissue]
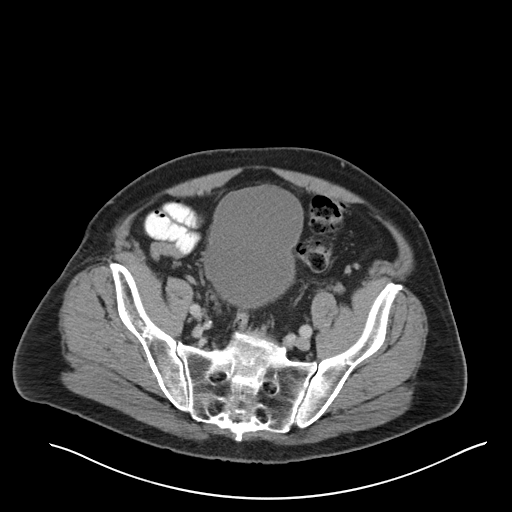
[im 31/109  soft-tissue]
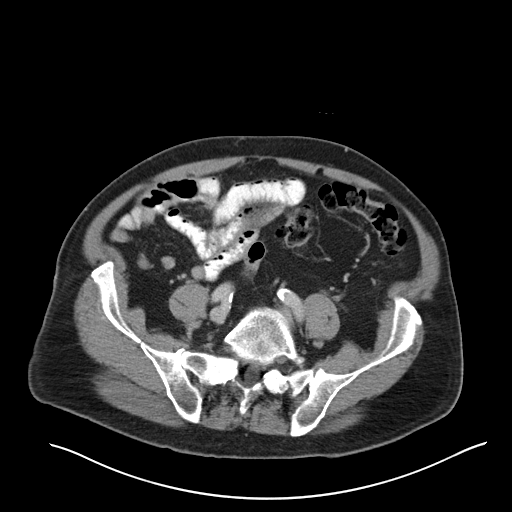
[im 37/109  soft-tissue]
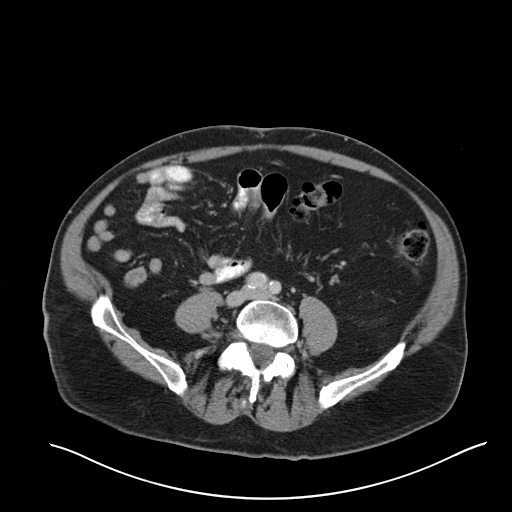
[im 49/109  soft-tissue]
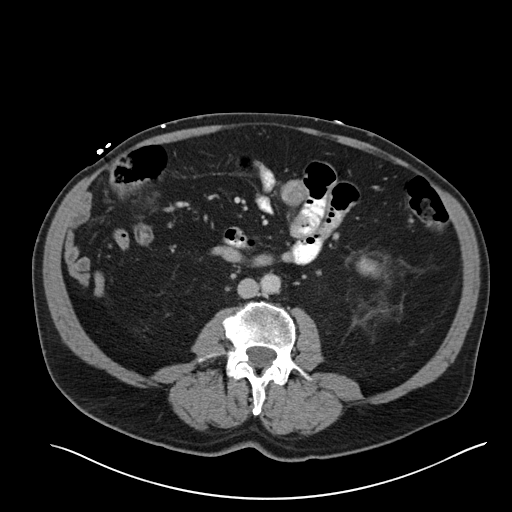
[im 55/109  soft-tissue]
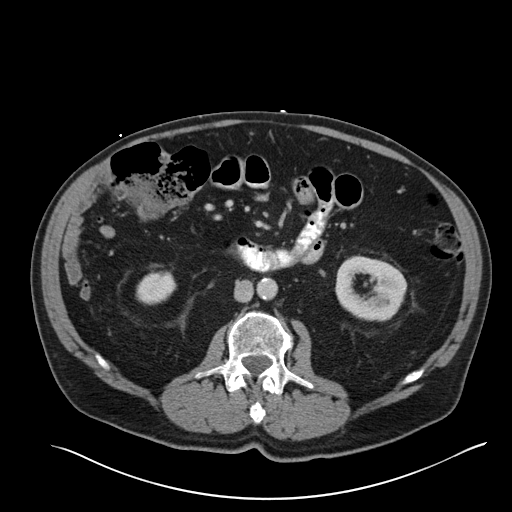
[im 61/109  soft-tissue]
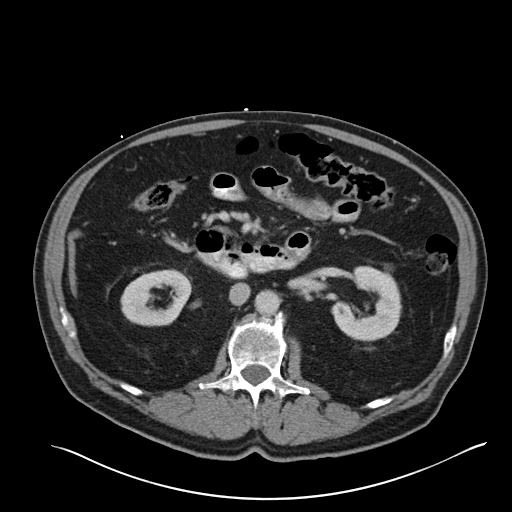
[im 73/109  soft-tissue]
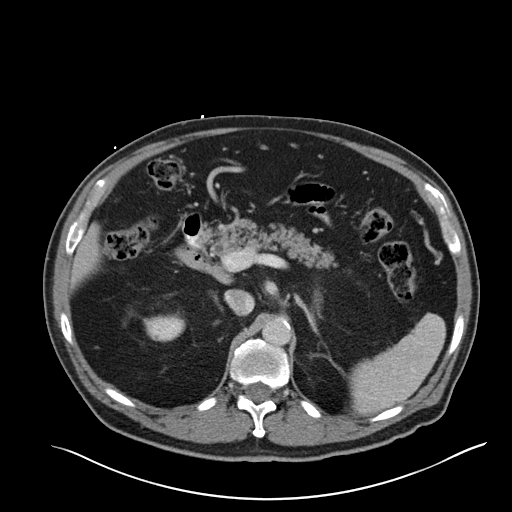
[im 73/109  bone]
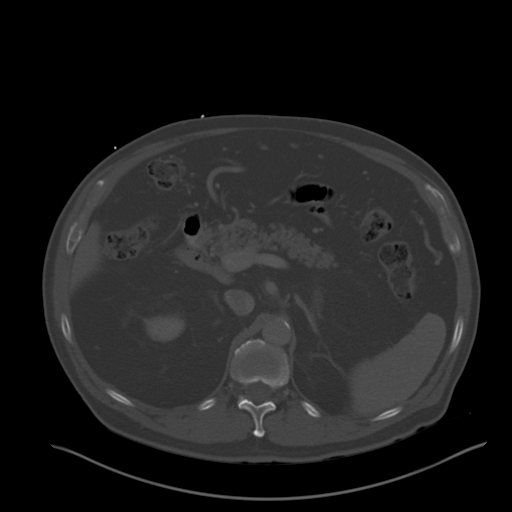
[im 79/109  soft-tissue]
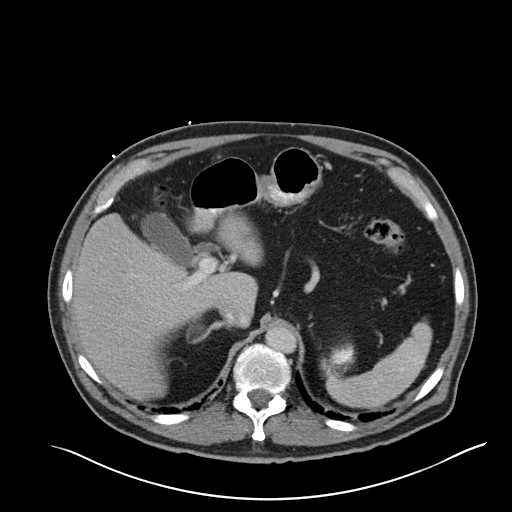
[im 85/109  soft-tissue]
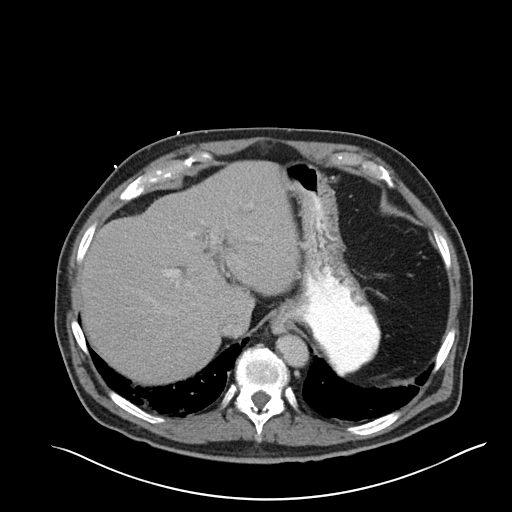
[im 97/109  soft-tissue]
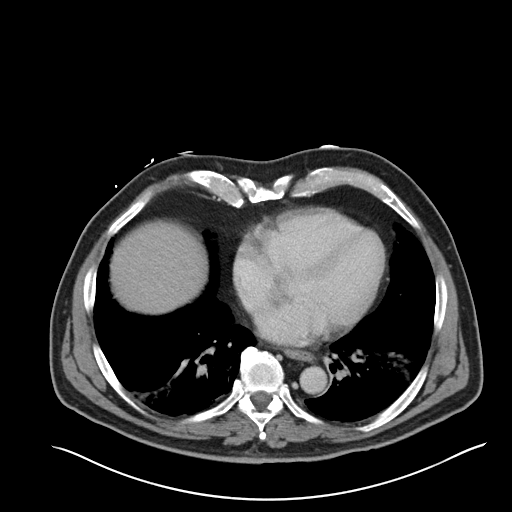
[im 103/109  soft-tissue]
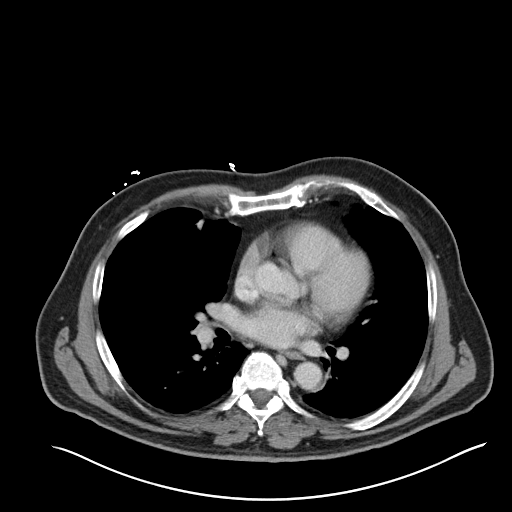

[Series 5: coronal soft tissue · coronal · 0.93mm/px · 3 of 102 slices shown]
[im 34/102  soft-tissue]
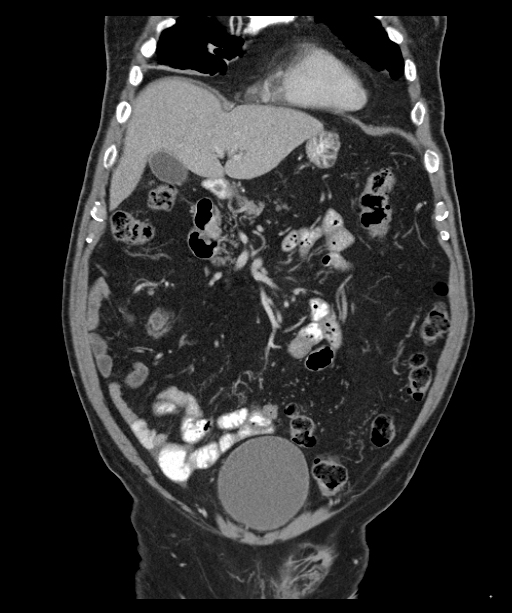
[im 45/102  soft-tissue]
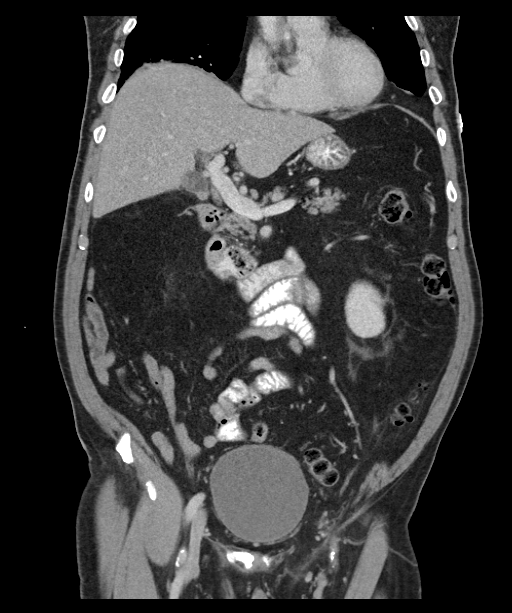
[im 57/102  soft-tissue]
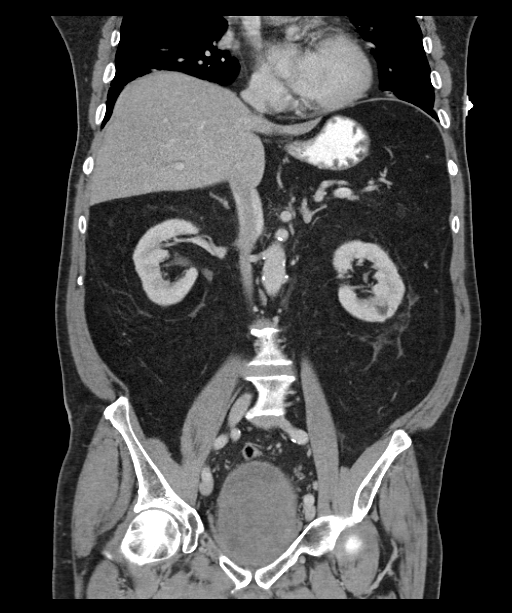

[16 of 46 positions shown; findings below may reference images not displayed]

FINDINGS: Lower chest: Volume loss in both lower lobes with linear
atelectasis.

Liver: Mild intrahepatic biliary ductal dilatation. Tiny 6 mm
hypodensity in the subcapsular right hepatic lobe, too small to
characterize.

Hepatobiliary: Prominent common bile duct measuring 9 mm at the
porta hepatis. Gallbladder is physiologically distended. Small
layering stones or sludge.

Pancreas: No pancreatic ductal dilatation or surrounding
inflammation. Mildly atrophic.

Spleen: Normal in size without focal lesion. Trace perisplenic
fluid.

Adrenal glands: No nodule.

Kidneys: Symmetric renal enhancement. No hydronephrosis. Small
bilateral cortical cysts.

Stomach/Bowel: Small hiatal hernia. Stomach physiologically
distended. There are no dilated or thickened small bowel loops.
Small-moderate volume of stool throughout the colon without colonic
wall thickening. Cecum is high-riding in the mid abdomen. The
appendix is normal.

Vascular/Lymphatic: No retroperitoneal adenopathy. Abdominal aorta
is normal in caliber. Atherosclerosis of the abdominal aorta without
aneurysm.

Reproductive: Marked enlargement of the prostate gland, measuring at
least 9.4 x 8.4 cm in by axial dimension. This causes mass effect on
the bladder base.

Bladder: Distended.  No wall thickening.

Other: No free air, free fluid, or intra-abdominal fluid collection.
Fat containing bilateral inguinal hernias, left larger than right.
No pelvic adenopathy.

Musculoskeletal: There are no acute or suspicious osseous
abnormalities.
IMPRESSION: 1. No bowel obstruction.  No signs of bowel inflammation.
2. Mild intrahepatic biliary ductal dilatation, this is new from
prior exam. Common bile duct appears prominent measuring 9 mm. No
abnormal gallbladder distention. Significance is uncertain. If
patient is able tolerate breath hold technique in there is clinical
concern for hepatobiliary pathology, an MRCP could be considered.
3. Marked enlargement of the prostate gland, similar to prior exam.

## 2016-11-21 LAB — CUP PACEART REMOTE DEVICE CHECK
Date Time Interrogation Session: 20171217014256
MDC IDC PG IMPLANT DT: 20160922

## 2016-11-21 NOTE — Progress Notes (Signed)
Carelink summary report received. Battery status OK. Normal device function. No new symptom episodes or brady episodes. No new AF episodes. 4 tachy- 3 w/ ECGs appear SVT, 2 pauses longest 4 sec's. (All episodes previously noted on in office check) Monthly summary reports and ROV/PRN 

## 2016-12-01 ENCOUNTER — Ambulatory Visit (INDEPENDENT_AMBULATORY_CARE_PROVIDER_SITE_OTHER): Payer: Medicare Other | Admitting: *Deleted

## 2016-12-01 DIAGNOSIS — I639 Cerebral infarction, unspecified: Secondary | ICD-10-CM | POA: Diagnosis not present

## 2016-12-02 NOTE — Progress Notes (Signed)
Carelink Summary Report / Loop Recorder 

## 2016-12-03 ENCOUNTER — Other Ambulatory Visit: Payer: Self-pay | Admitting: Cardiology

## 2016-12-11 LAB — CUP PACEART REMOTE DEVICE CHECK
Implantable Pulse Generator Implant Date: 20160922
MDC IDC SESS DTM: 20180116013926

## 2016-12-11 NOTE — Progress Notes (Signed)
Carelink summary report received. Battery status OK. Normal device function. No new symptom episodes, tachy episodes, or brady episodes. No new AF episodes. 3 pauses-2 w/ ECGs longest 4 sec's, per WC note, pt not interested in further testing for pauses, does not feel that his intermittent s/s correlate. Monthly summary reports and ROV/PRN

## 2016-12-20 ENCOUNTER — Other Ambulatory Visit: Payer: Self-pay | Admitting: Cardiology

## 2016-12-27 LAB — CUP PACEART REMOTE DEVICE CHECK
Date Time Interrogation Session: 20180215013657
MDC IDC PG IMPLANT DT: 20160922

## 2016-12-30 ENCOUNTER — Ambulatory Visit: Payer: Medicare Other | Admitting: Nurse Practitioner

## 2016-12-30 ENCOUNTER — Other Ambulatory Visit: Payer: Self-pay | Admitting: Cardiology

## 2016-12-31 ENCOUNTER — Ambulatory Visit (INDEPENDENT_AMBULATORY_CARE_PROVIDER_SITE_OTHER): Payer: Medicare Other | Admitting: *Deleted

## 2016-12-31 DIAGNOSIS — I6330 Cerebral infarction due to thrombosis of unspecified cerebral artery: Secondary | ICD-10-CM

## 2017-01-03 NOTE — Progress Notes (Signed)
Carelink Summary Report / Loop Recorder 

## 2017-01-08 LAB — CUP PACEART REMOTE DEVICE CHECK
Implantable Pulse Generator Implant Date: 20160922
MDC IDC SESS DTM: 20180317020845

## 2017-01-08 NOTE — Progress Notes (Signed)
Carelink summary report received. Battery status OK. Normal device function. No new symptom episodes, tachy episodes, brady, or pause episodes. No new AF episodes. Monthly summary reports and ROV/PRN 

## 2017-01-31 ENCOUNTER — Ambulatory Visit (INDEPENDENT_AMBULATORY_CARE_PROVIDER_SITE_OTHER): Payer: Medicare Other | Admitting: *Deleted

## 2017-01-31 DIAGNOSIS — I6330 Cerebral infarction due to thrombosis of unspecified cerebral artery: Secondary | ICD-10-CM

## 2017-01-31 NOTE — Progress Notes (Signed)
Carelink Summary Report / Loop Recorder 

## 2017-02-03 ENCOUNTER — Other Ambulatory Visit: Payer: Self-pay

## 2017-02-13 LAB — CUP PACEART REMOTE DEVICE CHECK
Date Time Interrogation Session: 20180416023718
Implantable Pulse Generator Implant Date: 20160922

## 2017-02-13 NOTE — Progress Notes (Signed)
Carelink summary report received. Battery status OK. Normal device function. No new symptom episodes,  brady, or pause episodes. No new AF episodes. 22 tachy- 1 w/ ECG 01/05/17, no symptoms reported per EPIC. Monthly summary reports and ROV/PRN

## 2017-03-01 ENCOUNTER — Ambulatory Visit (INDEPENDENT_AMBULATORY_CARE_PROVIDER_SITE_OTHER): Payer: Medicare Other | Admitting: *Deleted

## 2017-03-01 DIAGNOSIS — I639 Cerebral infarction, unspecified: Secondary | ICD-10-CM

## 2017-03-02 NOTE — Progress Notes (Signed)
Carelink Summary Report / Loop Recorder 

## 2017-03-10 LAB — CUP PACEART REMOTE DEVICE CHECK
Implantable Pulse Generator Implant Date: 20160922
MDC IDC SESS DTM: 20180516023637

## 2017-03-10 NOTE — Progress Notes (Signed)
Carelink summary report received. Battery status OK. Normal device function. No new symptom episodes or brady episodes. No new AF episodes. 1 tachy- appears SVT. 2 pause- longest 4 sec's. No symptoms reported per EPIC, per 09/2016 note, pt did not want investigation for pause episodes. Monthly summary reports and ROV/PRN

## 2017-03-24 ENCOUNTER — Telehealth: Payer: Self-pay | Admitting: *Deleted

## 2017-03-24 NOTE — Telephone Encounter (Signed)
Spoke with patient regarding sending manual transmission. 4 tachy episodes, 1 available ECG. 1 pause episode, no ECG available. Manual Transmission sent successfully. Advised patient will review episode ECGs when received. Patient verbalized understanding.   Advised patient about F/U appointment with Dr. Curt Bears that needs to be scheduled. Patient states he will call back to make appointment.

## 2017-03-31 ENCOUNTER — Ambulatory Visit (INDEPENDENT_AMBULATORY_CARE_PROVIDER_SITE_OTHER): Payer: Medicare Other | Admitting: *Deleted

## 2017-03-31 DIAGNOSIS — I639 Cerebral infarction, unspecified: Secondary | ICD-10-CM | POA: Diagnosis not present

## 2017-04-01 NOTE — Progress Notes (Signed)
Carelink Summary Report / Loop Recorder 

## 2017-04-11 LAB — CUP PACEART REMOTE DEVICE CHECK
Date Time Interrogation Session: 20180615034031
MDC IDC PG IMPLANT DT: 20160922

## 2017-04-11 NOTE — Progress Notes (Signed)
Carelink summary report received. Battery status OK. Normal device function. No new symptom episodes or brady episodes. No new AF episodes. 4 tachy- ECGs appear SVT. 1 pause- 4 sec's (pauses previously noted, pt not interested in tx. at this time per EPIC. No pt symptoms reported per EPIC. Monthly summary reports and ROV/PRN

## 2017-05-02 ENCOUNTER — Ambulatory Visit (INDEPENDENT_AMBULATORY_CARE_PROVIDER_SITE_OTHER): Payer: Medicare Other | Admitting: *Deleted

## 2017-05-02 DIAGNOSIS — I639 Cerebral infarction, unspecified: Secondary | ICD-10-CM

## 2017-05-02 NOTE — Progress Notes (Signed)
Carelink Summary Report / Loop Recorder 

## 2017-05-06 NOTE — Telephone Encounter (Addendum)
Late entry from 03/24/17;   4 tachy episodes appear ST/SVT, longest 17 minutes, Max V rate 167 bpm, asymptomatic. 1 Pause episode, duration 4 seconds, patient asymptomatic. ECGs placed in Dr. Audie Box folder for review.

## 2017-05-16 LAB — CUP PACEART REMOTE DEVICE CHECK
Date Time Interrogation Session: 20180715034441
MDC IDC PG IMPLANT DT: 20160922

## 2017-05-30 ENCOUNTER — Ambulatory Visit (INDEPENDENT_AMBULATORY_CARE_PROVIDER_SITE_OTHER): Payer: Medicare Other | Admitting: *Deleted

## 2017-05-30 DIAGNOSIS — I639 Cerebral infarction, unspecified: Secondary | ICD-10-CM

## 2017-06-04 LAB — CUP PACEART REMOTE DEVICE CHECK
Implantable Pulse Generator Implant Date: 20160922
MDC IDC SESS DTM: 20180814094048

## 2017-06-04 NOTE — Progress Notes (Signed)
Carelink summary report received. Battery status OK. Normal device function. No new symptom episodes, tachy episodes, or brady episodes. No new AF episodes. 4 pause- ECGs appear true pauses. Per EPIC, pt not interested in tx. at this time. Monthly summary reports and ROV/PRN

## 2017-06-13 NOTE — Progress Notes (Signed)
Loop Recorder Summary Report 

## 2017-06-30 ENCOUNTER — Other Ambulatory Visit: Payer: Self-pay | Admitting: Cardiology

## 2017-06-30 ENCOUNTER — Ambulatory Visit (INDEPENDENT_AMBULATORY_CARE_PROVIDER_SITE_OTHER): Payer: Medicare Other | Admitting: *Deleted

## 2017-06-30 DIAGNOSIS — I639 Cerebral infarction, unspecified: Secondary | ICD-10-CM

## 2017-06-30 NOTE — Progress Notes (Signed)
Carelink Summary Report / Loop Recorder 

## 2017-06-30 NOTE — Telephone Encounter (Signed)
Patient was started on this medication by neurology. It has been refilled under Dr Curt Bears, but patient is PRN follow up with him. Please advise. Thanks, MI

## 2017-07-01 NOTE — Telephone Encounter (Signed)
Patient will need to get refilled through neurology if they were original prescriber.

## 2017-07-05 LAB — CUP PACEART REMOTE DEVICE CHECK
Implantable Pulse Generator Implant Date: 20160922
MDC IDC SESS DTM: 20180913100801

## 2017-07-11 ENCOUNTER — Other Ambulatory Visit: Payer: Self-pay | Admitting: Cardiology

## 2017-07-13 ENCOUNTER — Other Ambulatory Visit: Payer: Self-pay | Admitting: *Deleted

## 2017-07-13 NOTE — Telephone Encounter (Signed)
Patient called to request a refill on clopidogrel. I informed him that this needs to be refilled by his neurologist (see refill encounter from 06/30/17), but he states that he has not seen his neurologist for at least two years. He has also called his pcp to see if they will refill it, but he has not received a call back from them yet. He is almost out of medication and would like to know if Dr Curt Bears would refill it again. I have made him aware that as he is PRN follow up with Dr Curt Bears, if the rx is approved it would only for be for one ninety day refill and further refills would need to be addressed by his PCP or neurologist. Patient verbalized his understanding. Okay to refill? Please advise. Thanks, MI

## 2017-07-14 NOTE — Telephone Encounter (Signed)
Do not refill.  I called the patient and informed him that he saw his neurologist last on 12/31/2015 and was supposed to follow up with them in a year.  Advised patient to contact their office to schedule an appt for follow up and to discuss if medication is still needed. Pt responds -  "ok, I'll just go back to aspirin", and hung up.  Will forward this note to neurology for their [facility]

## 2017-07-14 NOTE — Telephone Encounter (Signed)
Agree patient needs neurology f/u appt

## 2017-08-01 ENCOUNTER — Ambulatory Visit (INDEPENDENT_AMBULATORY_CARE_PROVIDER_SITE_OTHER): Payer: Medicare Other | Admitting: *Deleted

## 2017-08-01 DIAGNOSIS — I639 Cerebral infarction, unspecified: Secondary | ICD-10-CM

## 2017-08-02 LAB — CUP PACEART REMOTE DEVICE CHECK
MDC IDC PG IMPLANT DT: 20160922
MDC IDC SESS DTM: 20181013100939

## 2017-08-02 NOTE — Progress Notes (Signed)
Carelink Summary Report / Loop Recorder 

## 2017-08-29 ENCOUNTER — Ambulatory Visit (INDEPENDENT_AMBULATORY_CARE_PROVIDER_SITE_OTHER): Payer: Medicare Other | Admitting: *Deleted

## 2017-08-29 DIAGNOSIS — I639 Cerebral infarction, unspecified: Secondary | ICD-10-CM

## 2017-08-29 NOTE — Progress Notes (Signed)
Carelink Summary Report / Loop Recorder 

## 2017-08-30 ENCOUNTER — Telehealth (INDEPENDENT_AMBULATORY_CARE_PROVIDER_SITE_OTHER): Payer: Self-pay | Admitting: Orthopaedic Surgery

## 2017-08-30 NOTE — Telephone Encounter (Signed)
[name],Travis Delfin Gant. 08/06/1930 [phone]   Please call pt to discuss his hernia.Pt stated he is having problems with his hernia,pt stated possible hernia surgery before with Dr.Blackman. I tried to sched pt for an appt and he stated it wasn't required.

## 2017-08-30 NOTE — Telephone Encounter (Signed)
Called pt he is good to go

## 2017-08-30 NOTE — Telephone Encounter (Signed)
Wrong Dr. Ninfa Linden I think he's trying to find Nedra Hai at [facility]

## 2017-09-05 ENCOUNTER — Other Ambulatory Visit: Payer: Self-pay | Admitting: Nurse Practitioner

## 2017-09-05 ENCOUNTER — Ambulatory Visit
Admission: RE | Admit: 2017-09-05 | Discharge: 2017-09-05 | Disposition: A | Discharge status: Home / Self Care | Payer: Medicare Other | Source: Ambulatory Visit | Attending: Nurse Practitioner | Admitting: Nurse Practitioner

## 2017-09-05 ENCOUNTER — Telehealth: Payer: Self-pay | Admitting: *Deleted

## 2017-09-05 DIAGNOSIS — M79671 Pain in right foot: Secondary | ICD-10-CM

## 2017-09-05 LAB — CUP PACEART REMOTE DEVICE CHECK
MDC IDC PG IMPLANT DT: 20160922
MDC IDC SESS DTM: 20181112104039

## 2017-09-05 NOTE — Telephone Encounter (Signed)
Received Carelink alert for 7 "tachy" episodes on LINQ.  Available ECG from 11/15 suggests SVT, duration 69min 50sec.  Spoke with patient.  He denies noticing any faster HRs, palpitations, or other cardiac symptoms.  Walked patient through manual transmission process for review of 6 additional tachy episodes.  Transmission received.  All episodes occurred on 11/15 between 1807-1851, hovering around 150bpm (tachy detection programmed at 146bpm).  Advised patient that ECGs will be reviewed by Dr. Curt Bears and we will call him back if any additional recommendations.  Patient is agreeable to this plan and denies additional questions or concerns at this time.

## 2017-09-15 ENCOUNTER — Other Ambulatory Visit: Payer: Self-pay | Admitting: Cardiology

## 2017-09-20 ENCOUNTER — Other Ambulatory Visit: Payer: Self-pay | Admitting: Surgery

## 2017-09-21 ENCOUNTER — Telehealth: Payer: Self-pay | Admitting: Cardiology

## 2017-09-21 NOTE — Telephone Encounter (Signed)
° °  [facility] Pre-operative Risk Assessment    Request for surgical clearance:  1. What type of surgery is being performed? Left inguinal hernia repair   2. When is this surgery scheduled? TBD  3. Are there any medications that need to be held prior to surgery and how long? plavix    4. Practice name and name of physician performing surgery? [facility], [place], Dr. Coralie Keens    5. What is your office phone and fax number? PH# [phone], FAX# [phone] ATTN: Malachi Bonds, CMA   6. Anesthesia type (None, local, MAC, general) ? General    Travis [name] 09/21/2017, 3:06 PM  _________________________________________________________________   (provider comments below)

## 2017-09-24 NOTE — Telephone Encounter (Signed)
   Primary [place], MD  Chart reviewed as part of pre-operative protocol coverage. Because of Travis Doig Ragon Jr.'s past medical history and time since last visit, he/she will require a follow-up visit in order to better assess preoperative cardiovascular risk. Last seen 09/2016, was due to f/u with EP 09/2017.  Pre-op covering staff: - Please schedule appointment and call patient to inform them. - Please contact requesting surgeon's office via preferred method (i.e, phone, fax) to inform them of need for appointment prior to surgery.  Charlie Pitter, PA-C  09/24/2017, 2:43 PM

## 2017-09-27 ENCOUNTER — Other Ambulatory Visit: Payer: Self-pay | Admitting: Cardiology

## 2017-09-28 ENCOUNTER — Telehealth: Payer: Self-pay | Admitting: *Deleted

## 2017-09-28 ENCOUNTER — Ambulatory Visit (INDEPENDENT_AMBULATORY_CARE_PROVIDER_SITE_OTHER): Payer: Medicare Other | Admitting: *Deleted

## 2017-09-28 DIAGNOSIS — I639 Cerebral infarction, unspecified: Secondary | ICD-10-CM

## 2017-09-28 NOTE — Telephone Encounter (Signed)
Spoke with patient, assisted him with sending a manual Carelink transmission.  All 3 "tachy" episodes reviewed.  ECGs suggest SVT vs. Sinus tach, occurred on 09/26/17 between 1550-1714 (device time may be slightly off due to daylight savings).  Patient was active at the time of the episodes per his device.  Patient reports he may have been out shoveling snow at the time.  Advised patient that as he continues to be asymptomatic with episodes, likely no changes.  Will plan to review ECGs with Dr. Curt Bears and Ermalinda Barrios, PA for any additional recommendations.  Patient agreeable to plan and denies additional questions or concerns at this time.

## 2017-09-28 NOTE — Telephone Encounter (Signed)
Pt have OV with Dr Curt Bears 12/13 for pre op clearance

## 2017-09-28 NOTE — Progress Notes (Signed)
Carelink Summary Report / Loop Recorder 

## 2017-09-28 NOTE — Telephone Encounter (Addendum)
Spoke with patient to request a manual Carelink transmission for review.  Advised that we received an alert for 3 "tachy" episodes, but only one transmitted automatically.  ECG suggests SVT, duration 28sec, occurred on 09/26/17.  Patient denies dizziness, chest discomfort, syncope, or palpitations.  Patient will attempt to send a transmission and call back if he has any issues.

## 2017-09-29 ENCOUNTER — Encounter: Payer: Self-pay | Admitting: Physician Assistant

## 2017-09-29 ENCOUNTER — Ambulatory Visit: Payer: Medicare Other | Admitting: Physician Assistant

## 2017-09-29 VITALS — BP 124/64 | HR 87 | Ht 69.0 in | Wt 196.8 lb

## 2017-09-29 DIAGNOSIS — I471 Supraventricular tachycardia: Secondary | ICD-10-CM | POA: Diagnosis not present

## 2017-09-29 DIAGNOSIS — I455 Other specified heart block: Secondary | ICD-10-CM

## 2017-09-29 DIAGNOSIS — I10 Essential (primary) hypertension: Secondary | ICD-10-CM | POA: Diagnosis not present

## 2017-09-29 DIAGNOSIS — I639 Cerebral infarction, unspecified: Secondary | ICD-10-CM

## 2017-09-29 DIAGNOSIS — Z01818 Encounter for other preprocedural examination: Secondary | ICD-10-CM | POA: Insufficient documentation

## 2017-09-29 DIAGNOSIS — I452 Bifascicular block: Secondary | ICD-10-CM

## 2017-09-29 HISTORY — DX: Bifascicular block: I45.2

## 2017-09-29 NOTE — Progress Notes (Signed)
Cardiology Office Note    Date:  09/29/2017   ID:  Travis Rocks., DOB [birth year], MRN [id number]  PCP:  Leonard Downing, MD  Cardiologist: Dr. Curt Bears  No chief complaint on file.   History of Present Illness:  Travis Veley. is a 83 y.o. male with history of cryptogenic stroke in September 2016.  At that time he was in normal sinus rhythm and had an echo showing normal LVEF no wall motion abnormalities.  He has a loop recorder.  Last saw Dr. Curt Bears 09/2016 and was doing well.  The monitor showed no evidence of atrial fibrillation.  He did have SVT as well as pauses up to 5 seconds.  He was asymptomatic with the pauses and he was sleeping.  He did not want any further investigation into the pauses and felt like his chronic dizziness is due to vertigo.    Patient is here today for preoperative clearance.  His loop recorder showed 3 episodes SVT versus sinus tachycardia that occurred yesterday while shoveling snow.  His heart rate gets up to 150 bpm but it is always when he is active.  He has had no current episodes of bradycardia or pauses but does have 58 pauses greater than 3 seconds over the lifetime of the monitor.   Past Medical History:  Diagnosis Date  . Arthritis   . Chronic kidney disease    stone 1990  . Hypertension   . Pre-diabetes   . PSA elevation   . Stroke [facility])     Past Surgical History:  Procedure Laterality Date  . EP IMPLANTABLE DEVICE N/A 07/10/2015   Procedure: Loop Recorder Insertion;  Surgeon: Will Meredith Leeds, MD;  Location: [facility];  Service: Cardiovascular;  Laterality: N/A;  . INGUINAL HERNIA REPAIR  10/06/2011   Procedure: HERNIA REPAIR INGUINAL ADULT;  Surgeon: Harl Bowie, MD;  Location: [facility];  Service: General;  Laterality: Left;  Open Left Inguinal Hernia Repair with Mesh  . JOINT REPLACEMENT    . KNEE SURGERY    . REPLACEMENT TOTAL KNEE  11/20/10   left  . UMBILICAL HERNIA REPAIR  10/06/2011   Procedure:  HERNIA REPAIR UMBILICAL ADULT;  Surgeon: Harl Bowie, MD;  Location: [facility];  Service: General;  Laterality: N/A;    Current Medications: Current Meds  Medication Sig  . clopidogrel (PLAVIX) 75 MG tablet Take 1 tablet by mouth  daily  . finasteride (PROSCAR) 5 MG tablet Take 5 mg by mouth daily.  . hydrochlorothiazide (HYDRODIURIL) 25 MG tablet TAKE 1 TABLET BY MOUTH  DAILY  . lisinopril (PRINIVIL,ZESTRIL) 10 MG tablet TAKE 1 TABLET BY MOUTH  DAILY  . metFORMIN (GLUCOPHAGE) 500 MG tablet Take 1 tablet (500 mg total) by mouth 2 (two) times daily with a meal.  . terazosin (HYTRIN) 10 MG capsule Take 1 capsule (10 mg total) by mouth at bedtime.     Allergies:   Patient has no known allergies.   Social History   Socioeconomic History  . Marital status: Married    Spouse name: None  . Number of children: None  . Years of education: None  . Highest education level: None  Social Needs  . Financial resource strain: None  . Food insecurity - worry: None  . Food insecurity - inability: None  . Transportation needs - medical: None  . Transportation needs - non-medical: None  Occupational History  . None  Tobacco Use  . Smoking status: Former  Smoker    Packs/day: 2.00    Years: 18.00    Pack years: 36.00    Types: Cigarettes, Cigars    Last attempt to quit: 10/03/1962    Years since quitting: 55.0  . Smokeless tobacco: Former Systems developer    Types: Chew  Substance and Sexual Activity  . Alcohol use: Yes    Alcohol/week: 0.6 oz    Types: 1 Shots of liquor per week    Comment: vodka socially  . Drug use: No  . Sexual activity: None  Other Topics Concern  . None  Social History Narrative  . None     Family History:  The patient's family history includes Heart disease in his father.   ROS:   Please see the history of present illness.    Review of Systems  Constitution: Negative.  HENT: Negative.   Cardiovascular: Negative.   Respiratory: Negative.   Endocrine: Negative.    Hematologic/Lymphatic: Negative.   Musculoskeletal: Negative.   Gastrointestinal: Negative.   Genitourinary: Negative.   Neurological: Negative.    All other systems reviewed and are negative.   PHYSICAL EXAM:   VS:  Ht 5\' 9"  (1.753 m)   Wt 196 lb 12.8 oz (89.3 kg)   BMI 29.06 kg/m   Physical Exam  GEN: Well nourished, well developed, in no acute distress  HEENT: normal  Neck: no JVD, carotid bruits, or masses Cardiac:RRR; no murmurs, rubs, or gallops  Respiratory:  clear to auscultation bilaterally, normal work of breathing GI: soft, nontender, nondistended, + BS Ext: without cyanosis, clubbing, or edema, Good distal pulses bilaterally MS: no deformity or atrophy  Skin: warm and dry, no rash Neuro:  Alert and Oriented x 3, Strength and sensation are intact Psych: euthymic mood, full affect  Wt Readings from Last 3 Encounters:  09/29/17 196 lb 12.8 oz (89.3 kg)  09/27/16 208 lb (94.3 kg)  01/30/16 205 lb 3.2 oz (93.1 kg)      Studies/Labs Reviewed:   EKG:  EKG is ordered today.  The ekg ordered today demonstrates normal sinus rhythm with right bundle branch block, left anterior fascicular block, bifascicular block  Recent Labs: No results found for requested labs within last 8760 hours.   Lipid Panel    Component Value Date/Time   CHOL 138 07/09/2015 0405   TRIG 124 07/09/2015 0405   HDL 37 (L) 07/09/2015 0405   CHOLHDL 3.7 07/09/2015 0405   VLDL 25 07/09/2015 0405   LDLCALC 76 07/09/2015 0405    Additional studies/ records that were reviewed today include:  2D echo 07/08/15  Review of the above records today demonstrates:  - Left ventricle: The cavity size was normal. Systolic function was   normal. The estimated ejection fraction was in the range of 55%   to 60%. Wall motion was normal; there were no regional wall   motion abnormalities. - Aortic valve: Trileaflet; mildly thickened, mildly calcified   leaflets. There was mild regurgitation. - Aorta:  Aortic root dimension: 40 mm (ED). Ascending aorta   diameter: 42 mm (ED). - Aortic root: The aortic root was mildly dilated. - Ascending aorta: The ascending aorta was mildly dilated.        ASSESSMENT:    1. Preoperative clearance   2. Cryptogenic stroke ([place])   3. SVT (supraventricular tachycardia) ([place])   4. Sinus pause   5. Essential hypertension      PLAN:  In order of problems listed above:  Preoperative clearance patient with history of cryptogenic  stroke but no evidence of afib on loop recorder. No history of CAD or previous workup. Patient asymptomatic with Sinus tachycardia vs SVT with shoveling snow documented on loop recorder.  According to the Revised Cardiac Risk Index (RCRI), his Perioperative Risk of Major Cardiac Event is (%): 0.9  His   according to the [place] Activity Status Index (DASI). He therefore can proceed with hernia repair without any further cardiac workup.  Cryptogenic stroke on Plavix no evidence of atrial fibrillation on loop recorder. Contact neurologist for recommendations on Plavix   SVT and sinus pauses on loop recorder.  SVT vs sinus tachycardia at 150 bpm yesterday while shoveling snow.  Has had in the past always related to activity.  Sinus pauses up to 5 seconds in the past but no current pauses on monitoring from November 19 - September 28, 2017.  Medication Adjustments/Labs and Tests Ordered: Current medicines are reviewed at length with the patient today.  Concerns regarding medicines are outlined above.  Medication changes, Labs and Tests ordered today are listed in the Patient Instructions below. There are no Patient Instructions on file for this visit.   Sumner Boast, PA-C  09/29/2017 11:15 AM    [facility] [address], [place], [place]  [address] Phone: [phone]; Fax: [phone]

## 2017-09-29 NOTE — Patient Instructions (Signed)
Medication Instructions:  Your physician recommends that you continue on your current medications as directed. Please refer to the Current Medication list given to you today.   Labwork: None ordered  Testing/Procedures: None ordered  Follow-Up: Your physician recommends that you schedule a follow-up appointment in: AS SCHEDULED   Any Other Special Instructions Will Be Listed Below (If Applicable).     If you need a refill on your cardiac medications before your next appointment, please call your pharmacy.   

## 2017-10-05 LAB — CUP PACEART REMOTE DEVICE CHECK
Implantable Pulse Generator Implant Date: 20160922
MDC IDC SESS DTM: 20181212110736

## 2017-10-07 NOTE — Telephone Encounter (Signed)
Per result note from Dr. Curt Bears on 10/05/17, no additional recommendations unless patient symptomatic with episodes.

## 2017-10-13 ENCOUNTER — Telehealth (INDEPENDENT_AMBULATORY_CARE_PROVIDER_SITE_OTHER): Payer: Self-pay | Admitting: Orthopaedic Surgery

## 2017-10-13 NOTE — Telephone Encounter (Signed)
Called wrong office. error

## 2017-10-14 NOTE — Pre-Procedure Instructions (Signed)
Travis Bruins Cygan Jr.  10/14/2017      [facility] - [place] - [address] [name] [address] Phone: [phone] Fax: [phone]    Your procedure is scheduled on October 19, 2017.  Report to [facility] Admitting at 1100 AM.  Call this number if you have problems the morning of surgery:  [phone]   Remember:  Do not eat food or drink liquids after midnight.  Take these medicines the morning of surgery with A SIP OF WATER finasteride (proscar).  Stop plavix as instructed by your doctor.  7 days prior to surgery STOP taking any Aspirin (unless otherwise instructed by your surgeon), Aleve, Naproxen, Ibuprofen, Motrin, Advil, Goody's, BC's, all herbal medications, fish oil, and all vitamins  Continue all other medications as instructed by your physician except follow the above medication instructions before surgery  WHAT DO I DO ABOUT MY DIABETES MEDICATION?  Marland Kitchen Do not take oral diabetes medicines (pills) the morning of surgery.  Reviewed and Endorsed by [facility] Patient Education Committee, August 2015   How to Manage Your Diabetes Before and After Surgery  Why is it important to control my blood sugar before and after surgery? . Improving blood sugar levels before and after surgery helps healing and can limit problems. . A way of improving blood sugar control is eating a healthy diet by: o  Eating less sugar and carbohydrates o  Increasing activity/exercise o  Talking with your doctor about reaching your blood sugar goals . High blood sugars (greater than 180 mg/dL) can raise your risk of infections and slow your recovery, so you will need to focus on controlling your diabetes during the weeks before surgery. . Make sure that the doctor who takes care of your diabetes knows about your planned surgery including the date and location.  How do I manage my blood sugar before  surgery? . Check your blood sugar at least 4 times a day, starting 2 days before surgery, to make sure that the level is not too high or low. o Check your blood sugar the morning of your surgery when you wake up and every 2 hours until you get to the Short Stay unit. . If your blood sugar is less than 70 mg/dL, you will need to treat for low blood sugar: o Do not take insulin. o Treat a low blood sugar (less than 70 mg/dL) with  cup of clear juice (cranberry or apple), 4 glucose tablets, OR glucose gel. Recheck blood sugar in 15 minutes after treatment (to make sure it is greater than 70 mg/dL). If your blood sugar is not greater than 70 mg/dL on recheck, call [phone] o  for further instructions. . Report your blood sugar to the short stay nurse when you get to Short Stay.  . If you are admitted to the hospital after surgery: o Your blood sugar will be checked by the staff and you will probably be given insulin after surgery (instead of oral diabetes medicines) to make sure you have good blood sugar levels. o The goal for blood sugar control after surgery is 80-180 mg/dL.    Do not wear jewelry, make-up or nail polish.  Do not wear lotions, powders, or perfumes, or deodorant.  Men may shave face and neck.  Do not bring valuables to the hospital.  [facility] is not responsible for any belongings or valuables.  Contacts, dentures or bridgework may  not be worn into surgery.  Leave your suitcase in the car.  After surgery it may be brought to your room.  For patients admitted to the hospital, discharge time will be determined by your treatment team.  Patients discharged the day of surgery will not be allowed to drive home.   Special instructions:   [facility]- Preparing For Surgery  Before surgery, you can play an important role. Because skin is not sterile, your skin needs to be as free of germs as possible. You can reduce the number of germs on your skin by washing with CHG  (chlorahexidine gluconate) Soap before surgery.  CHG is an antiseptic cleaner which kills germs and bonds with the skin to continue killing germs even after washing.  Please do not use if you have an allergy to CHG or antibacterial soaps. If your skin becomes reddened/irritated stop using the CHG.  Do not shave (including legs and underarms) for at least 48 hours prior to first CHG shower. It is OK to shave your face.  Please follow these instructions carefully.   1. Shower the NIGHT BEFORE SURGERY and the MORNING OF SURGERY with CHG.   2. If you chose to wash your hair, wash your hair first as usual with your normal shampoo.  3. After you shampoo, rinse your hair and body thoroughly to remove the shampoo.  4. Use CHG as you would any other liquid soap. You can apply CHG directly to the skin and wash gently with a scrungie or a clean washcloth.   5. Apply the CHG Soap to your body ONLY FROM THE NECK DOWN.  Do not use on open wounds or open sores. Avoid contact with your eyes, ears, mouth and genitals (private parts). Wash Face and genitals (private parts)  with your normal soap.  6. Wash thoroughly, paying special attention to the area where your surgery will be performed.  7. Thoroughly rinse your body with warm water from the neck down.  8. DO NOT shower/wash with your normal soap after using and rinsing off the CHG Soap.  9. Pat yourself dry with a CLEAN TOWEL.  10. Wear CLEAN PAJAMAS to bed the night before surgery, wear comfortable clothes the morning of surgery  11. Place CLEAN SHEETS on your bed the night of your first shower and DO NOT SLEEP WITH PETS.    Day of Surgery: Do not apply any deodorants/lotions. Please wear clean clothes to the hospital/surgery center.     Please read over the following fact sheets that you were given. Pain Booklet, Coughing and Deep Breathing and Surgical Site Infection Prevention

## 2017-10-17 ENCOUNTER — Encounter (HOSPITAL_COMMUNITY): Payer: Self-pay

## 2017-10-17 ENCOUNTER — Encounter (HOSPITAL_COMMUNITY)
Admission: RE | Admit: 2017-10-17 | Discharge: 2017-10-17 | Disposition: A | Discharge status: Home / Self Care | Payer: Medicare Other | Source: Ambulatory Visit | Attending: Surgery | Admitting: Surgery

## 2017-10-17 ENCOUNTER — Other Ambulatory Visit: Payer: Self-pay

## 2017-10-17 DIAGNOSIS — R7303 Prediabetes: Secondary | ICD-10-CM | POA: Diagnosis not present

## 2017-10-17 DIAGNOSIS — I129 Hypertensive chronic kidney disease with stage 1 through stage 4 chronic kidney disease, or unspecified chronic kidney disease: Secondary | ICD-10-CM | POA: Diagnosis not present

## 2017-10-17 DIAGNOSIS — Z87891 Personal history of nicotine dependence: Secondary | ICD-10-CM | POA: Diagnosis not present

## 2017-10-17 DIAGNOSIS — M1990 Unspecified osteoarthritis, unspecified site: Secondary | ICD-10-CM | POA: Diagnosis not present

## 2017-10-17 DIAGNOSIS — K4091 Unilateral inguinal hernia, without obstruction or gangrene, recurrent: Secondary | ICD-10-CM | POA: Diagnosis present

## 2017-10-17 DIAGNOSIS — N189 Chronic kidney disease, unspecified: Secondary | ICD-10-CM | POA: Diagnosis not present

## 2017-10-17 HISTORY — DX: Personal history of urinary calculi: Z87.442

## 2017-10-17 HISTORY — DX: Type 2 diabetes mellitus without complications: E11.9

## 2017-10-17 LAB — BASIC METABOLIC PANEL
ANION GAP: 9 (ref 5–15)
BUN: 20 mg/dL (ref 6–20)
CALCIUM: 10 mg/dL (ref 8.9–10.3)
CHLORIDE: 101 mmol/L (ref 101–111)
CO2: 24 mmol/L (ref 22–32)
CREATININE: 1.2 mg/dL (ref 0.61–1.24)
GFR calc non Af Amer: 53 mL/min — ABNORMAL LOW (ref >60)
Glucose, Bld: 133 mg/dL — ABNORMAL HIGH (ref 65–99)
Potassium: 4 mmol/L (ref 3.5–5.1)
SODIUM: 134 mmol/L — AB (ref 135–145)

## 2017-10-17 LAB — CBC
HCT: 39.3 % (ref 39.0–52.0)
HEMOGLOBIN: 12.8 g/dL — AB (ref 13.0–17.0)
MCH: 30 pg (ref 26.0–34.0)
MCHC: 32.6 g/dL (ref 30.0–36.0)
MCV: 92 fL (ref 78.0–100.0)
PLATELETS: 239 10*3/uL (ref 150–400)
RBC: 4.27 MIL/uL (ref 4.22–5.81)
RDW: 13.5 % (ref 11.5–15.5)
WBC: 6.9 10*3/uL (ref 4.0–10.5)

## 2017-10-17 LAB — HEMOGLOBIN A1C
HEMOGLOBIN A1C: 6.3 % — AB (ref 4.8–5.6)
Mean Plasma Glucose: 134.11 mg/dL

## 2017-10-17 LAB — GLUCOSE, CAPILLARY: GLUCOSE-CAPILLARY: 142 mg/dL — AB (ref 65–99)

## 2017-10-17 NOTE — Progress Notes (Signed)
Anesthesia Chart Review:  Pt is an 83 year old male scheduled for L inguinal hernia repair with mesh on 10/19/2017 with Coralie Keens, MD  - PCP is Claris Gower, MD - Cardiologist is Allegra Lai, MD. Last office visit 09/29/17 with Ermalinda Barrios, PA who cleared pt for surgery  PMH includes:  HTN, DM, stroke (07/07/15), loop recorder (inserted 07/10/15), SVT episodes and sinus pauses (found on loop recorder), CKD. Former smoker (quit 1963). BMI 29. S/p L inguinal hernia repair, umbilical hernia repair 88/82/80.   Medications include: plavix, hctz, lisinopril, metformin, terazosin.  Last dose plavix 10/16/17.   BP 136/63   Pulse 90   Temp 36.6 C   Resp 20   Ht 5\' 9"  (1.753 m)   Wt 196 lb 3.2 oz (89 kg)   SpO2 99%   BMI 28.97 kg/m   Preoperative labs reviewed.   - HbA1c 6.3, glucose 133  EKG 09/29/17: NSR. RBBB. LAFB.   Carotid duplex 07/08/15:  - Findings suggest 1-39% internal carotid artery stenosis bilaterally. The left vertebral artery is patent with antegrade flow. Unable to visualize the right vertebral artery.  Echo 07/08/15:  - Left ventricle: The cavity size was normal. Systolic function was normal. The estimated ejection fraction was in the range of 55% to 60%. Wall motion was normal; there were no regional wall motion abnormalities. - Aortic valve: Trileaflet; mildly thickened, mildly calcified leaflets. There was mild regurgitation. - Aorta: Aortic root dimension: 40 mm (ED). Ascending aorta diameter: 42 mm (ED). - Aortic root: The aortic root was mildly dilated (28mm). - Ascending aorta: The ascending aorta was mildly dilated (46mm).  If no changes, I anticipate pt can proceed with surgery as scheduled.   Willeen Cass, FNP-BC [facility]/Anesthesiology Phone: [phone] 10/17/2017 1:24 PM

## 2017-10-17 NOTE — Progress Notes (Addendum)
PCP is Dr. Claris Gower Cardiologist is Will Ely his loop recorder  States he no longer sees Dr Leonie Man and that Dr Arelia Sneddon is who manges his plavix He states that he was started on Plavix by Dr Leonie Man 2 years ago after a stroke.  Dr Trevor Mace office called and informed that he was not told to stop plavix.(Spoke with Milderd Meager) also informed her that Mr Gunkel has not taken his plavix today, and stated he would wait to hear from the office before taking it today.   Denies chest pain, fever, but reports a non productive cough.  Reports his fasting CBG's run around 140. Stop bang sent to Dr Arelia Sneddon   Huntley Dec, FNP called and informed of need to review his chart.

## 2017-10-17 NOTE — Progress Notes (Signed)
Informed Travis [name] that he should not take Plavix today and to remain off until after surgery per Dr Ninfa Linden. Voices understanding.

## 2017-10-17 NOTE — Progress Notes (Signed)
   10/17/17 0927  OBSTRUCTIVE SLEEP APNEA  Have you ever been diagnosed with sleep apnea through a sleep study? No  Do you snore loudly (loud enough to be heard through closed doors)?  1  Do you often feel tired, fatigued, or sleepy during the daytime (such as falling asleep during driving or talking to someone)? 0  Has anyone observed you stop breathing during your sleep? 1  Do you have, or are you being treated for high blood pressure? 1  BMI more than 35 kg/m2? 0  Age > 50 (1-yes) 1  Neck circumference greater than:Male 16 inches or larger, Male 17inches or larger? 0  Male Gender (Yes=1) 1  Obstructive Sleep Apnea Score 5  Score 5 or greater  Results sent to PCP

## 2017-10-19 ENCOUNTER — Encounter (HOSPITAL_COMMUNITY): Payer: Self-pay

## 2017-10-19 ENCOUNTER — Ambulatory Visit (HOSPITAL_COMMUNITY): Payer: Medicare Other | Admitting: Emergency Medicine

## 2017-10-19 ENCOUNTER — Other Ambulatory Visit: Payer: Self-pay

## 2017-10-19 ENCOUNTER — Encounter (HOSPITAL_COMMUNITY)
Admission: RE | Disposition: A | Discharge status: Home / Self Care | Payer: Self-pay | Source: Ambulatory Visit | Attending: Surgery

## 2017-10-19 ENCOUNTER — Ambulatory Visit (HOSPITAL_COMMUNITY): Payer: Medicare Other | Admitting: Anesthesiology

## 2017-10-19 ENCOUNTER — Ambulatory Visit (HOSPITAL_COMMUNITY)
Admission: RE | Admit: 2017-10-19 | Discharge: 2017-10-19 | Disposition: A | Discharge status: Home / Self Care | Payer: Medicare Other | Source: Ambulatory Visit | Attending: Surgery | Admitting: Surgery

## 2017-10-19 DIAGNOSIS — Z87891 Personal history of nicotine dependence: Secondary | ICD-10-CM | POA: Insufficient documentation

## 2017-10-19 DIAGNOSIS — M1990 Unspecified osteoarthritis, unspecified site: Secondary | ICD-10-CM | POA: Insufficient documentation

## 2017-10-19 DIAGNOSIS — I129 Hypertensive chronic kidney disease with stage 1 through stage 4 chronic kidney disease, or unspecified chronic kidney disease: Secondary | ICD-10-CM | POA: Diagnosis not present

## 2017-10-19 DIAGNOSIS — K4091 Unilateral inguinal hernia, without obstruction or gangrene, recurrent: Secondary | ICD-10-CM | POA: Diagnosis not present

## 2017-10-19 DIAGNOSIS — R7303 Prediabetes: Secondary | ICD-10-CM | POA: Insufficient documentation

## 2017-10-19 DIAGNOSIS — N189 Chronic kidney disease, unspecified: Secondary | ICD-10-CM | POA: Insufficient documentation

## 2017-10-19 HISTORY — PX: INSERTION OF MESH: SHX5868

## 2017-10-19 HISTORY — PX: INGUINAL HERNIA REPAIR: SHX194

## 2017-10-19 HISTORY — DX: Unilateral inguinal hernia, without obstruction or gangrene, recurrent: K40.91

## 2017-10-19 LAB — GLUCOSE, CAPILLARY
GLUCOSE-CAPILLARY: 114 mg/dL — AB (ref 65–99)
Glucose-Capillary: 112 mg/dL — ABNORMAL HIGH (ref 65–99)

## 2017-10-19 SURGERY — REPAIR, HERNIA, INGUINAL, ADULT
Anesthesia: General | Site: Inguinal | Laterality: Left

## 2017-10-19 MED ORDER — PROPOFOL 10 MG/ML IV BOLUS
INTRAVENOUS | Status: AC
  Filled 2017-10-19: qty 20

## 2017-10-19 MED ORDER — OXYCODONE HCL 5 MG PO TABS: 5.0000 mg | ORAL_TABLET | Freq: Once | ORAL | Status: DC | PRN

## 2017-10-19 MED ORDER — CEFAZOLIN SODIUM-DEXTROSE 2-4 GM/100ML-% IV SOLN
2.0000 g | INTRAVENOUS | Status: AC
  Administered 2017-10-19: 2 g via INTRAVENOUS

## 2017-10-19 MED ORDER — 0.9 % SODIUM CHLORIDE (POUR BTL) OPTIME
TOPICAL | Status: DC | PRN
  Administered 2017-10-19: 1000 mL

## 2017-10-19 MED ORDER — ONDANSETRON HCL 4 MG/2ML IJ SOLN: 4.0000 mg | Freq: Once | INTRAMUSCULAR | Status: DC | PRN

## 2017-10-19 MED ORDER — FENTANYL CITRATE (PF) 100 MCG/2ML IJ SOLN
50.0000 ug | Freq: Once | INTRAMUSCULAR | Status: AC
  Administered 2017-10-19: 50 ug via INTRAVENOUS

## 2017-10-19 MED ORDER — ROCURONIUM BROMIDE 100 MG/10ML IV SOLN
INTRAVENOUS | Status: DC | PRN
  Administered 2017-10-19: 50 mg via INTRAVENOUS

## 2017-10-19 MED ORDER — MIDAZOLAM HCL 2 MG/2ML IJ SOLN
INTRAMUSCULAR | Status: AC
  Administered 2017-10-19: 1 mg via INTRAVENOUS
  Filled 2017-10-19: qty 2

## 2017-10-19 MED ORDER — BUPIVACAINE-EPINEPHRINE (PF) 0.5% -1:200000 IJ SOLN
INTRAMUSCULAR | Status: AC
  Filled 2017-10-19: qty 30

## 2017-10-19 MED ORDER — BUPIVACAINE-EPINEPHRINE 0.5% -1:200000 IJ SOLN
INTRAMUSCULAR | Status: DC | PRN
  Administered 2017-10-19: 30 mL

## 2017-10-19 MED ORDER — LIDOCAINE HCL (CARDIAC) 20 MG/ML IV SOLN
INTRAVENOUS | Status: DC | PRN
  Administered 2017-10-19: 80 mg via INTRAVENOUS

## 2017-10-19 MED ORDER — FENTANYL CITRATE (PF) 250 MCG/5ML IJ SOLN
INTRAMUSCULAR | Status: AC
  Filled 2017-10-19: qty 5

## 2017-10-19 MED ORDER — MIDAZOLAM HCL 2 MG/2ML IJ SOLN
1.0000 mg | Freq: Once | INTRAMUSCULAR | Status: AC
  Administered 2017-10-19: 1 mg via INTRAVENOUS

## 2017-10-19 MED ORDER — SUGAMMADEX SODIUM 200 MG/2ML IV SOLN
INTRAVENOUS | Status: DC | PRN
  Administered 2017-10-19: 177.8 mg via INTRAVENOUS

## 2017-10-19 MED ORDER — PROPOFOL 10 MG/ML IV BOLUS
INTRAVENOUS | Status: DC | PRN
  Administered 2017-10-19: 160 mg via INTRAVENOUS

## 2017-10-19 MED ORDER — FENTANYL CITRATE (PF) 100 MCG/2ML IJ SOLN
INTRAMUSCULAR | Status: DC | PRN
  Administered 2017-10-19 (×2): 50 ug via INTRAVENOUS

## 2017-10-19 MED ORDER — FENTANYL CITRATE (PF) 100 MCG/2ML IJ SOLN: 25.0000 ug | INTRAMUSCULAR | Status: DC | PRN

## 2017-10-19 MED ORDER — ONDANSETRON HCL 4 MG/2ML IJ SOLN
INTRAMUSCULAR | Status: DC | PRN
  Administered 2017-10-19: 4 mg via INTRAVENOUS

## 2017-10-19 MED ORDER — OXYCODONE HCL 5 MG PO TABS: 5.0000 mg | ORAL_TABLET | Freq: Four times a day (QID) | ORAL | 0 refills | Status: DC | PRN

## 2017-10-19 MED ORDER — CHLORHEXIDINE GLUCONATE CLOTH 2 % EX PADS: 6.0000 | MEDICATED_PAD | Freq: Once | CUTANEOUS | Status: DC

## 2017-10-19 MED ORDER — CEFAZOLIN SODIUM-DEXTROSE 2-4 GM/100ML-% IV SOLN
INTRAVENOUS | Status: AC
  Filled 2017-10-19: qty 100

## 2017-10-19 MED ORDER — LACTATED RINGERS IV SOLN
INTRAVENOUS | Status: DC
  Administered 2017-10-19 (×2): via INTRAVENOUS

## 2017-10-19 MED ORDER — FENTANYL CITRATE (PF) 100 MCG/2ML IJ SOLN
INTRAMUSCULAR | Status: AC
  Administered 2017-10-19: 50 ug via INTRAVENOUS
  Filled 2017-10-19: qty 2

## 2017-10-19 MED ORDER — OXYCODONE HCL 5 MG/5ML PO SOLN: 5.0000 mg | Freq: Once | ORAL | Status: DC | PRN

## 2017-10-19 SURGICAL SUPPLY — 45 items
ADH SKN CLS APL DERMABOND .7 (GAUZE/BANDAGES/DRESSINGS) ×1
BLADE CLIPPER SURG (BLADE) ×2 IMPLANT
BLADE SURG 10 STRL SS (BLADE) ×3 IMPLANT
BLADE SURG 15 STRL LF DISP TIS (BLADE) ×1 IMPLANT
BLADE SURG 15 STRL SS (BLADE) ×3
CHLORAPREP W/TINT 26ML (MISCELLANEOUS) ×3 IMPLANT
COVER SURGICAL LIGHT HANDLE (MISCELLANEOUS) ×3 IMPLANT
DERMABOND ADVANCED (GAUZE/BANDAGES/DRESSINGS) ×2
DERMABOND ADVANCED .7 DNX12 (GAUZE/BANDAGES/DRESSINGS) ×1 IMPLANT
DRAIN PENROSE 1/2X12 LTX STRL (WOUND CARE) ×2 IMPLANT
DRAPE LAPAROTOMY TRNSV 102X78 (DRAPE) ×3 IMPLANT
DRAPE UTILITY XL STRL (DRAPES) ×3 IMPLANT
ELECT CAUTERY BLADE 6.4 (BLADE) ×3 IMPLANT
ELECT REM PT RETURN 9FT ADLT (ELECTROSURGICAL) ×3
ELECTRODE REM PT RTRN 9FT ADLT (ELECTROSURGICAL) ×1 IMPLANT
GLOVE BIO SURGEON STRL SZ7 (GLOVE) ×2 IMPLANT
GLOVE BIOGEL PI IND STRL 6.5 (GLOVE) IMPLANT
GLOVE BIOGEL PI IND STRL 7.0 (GLOVE) IMPLANT
GLOVE BIOGEL PI INDICATOR 6.5 (GLOVE) ×2
GLOVE BIOGEL PI INDICATOR 7.0 (GLOVE) ×2
GLOVE SURG SIGNA 7.5 PF LTX (GLOVE) ×3 IMPLANT
GOWN STRL REUS W/ TWL LRG LVL3 (GOWN DISPOSABLE) ×1 IMPLANT
GOWN STRL REUS W/ TWL XL LVL3 (GOWN DISPOSABLE) ×1 IMPLANT
GOWN STRL REUS W/TWL LRG LVL3 (GOWN DISPOSABLE) ×3
GOWN STRL REUS W/TWL XL LVL3 (GOWN DISPOSABLE) ×3
KIT BASIN OR (CUSTOM PROCEDURE TRAY) ×3 IMPLANT
KIT ROOM TURNOVER OR (KITS) ×3 IMPLANT
MESH HERNIA SYS ULTRAPRO LRG (Mesh General) ×2 IMPLANT
NDL HYPO 25GX1X1/2 BEV (NEEDLE) ×1 IMPLANT
NEEDLE HYPO 25GX1X1/2 BEV (NEEDLE) ×3 IMPLANT
NS IRRIG 1000ML POUR BTL (IV SOLUTION) ×3 IMPLANT
PACK SURGICAL SETUP 50X90 (CUSTOM PROCEDURE TRAY) ×3 IMPLANT
PAD ARMBOARD 7.5X6 YLW CONV (MISCELLANEOUS) ×3 IMPLANT
PENCIL BUTTON HOLSTER BLD 10FT (ELECTRODE) ×3 IMPLANT
SPONGE LAP 18X18 X RAY DECT (DISPOSABLE) ×3 IMPLANT
SUT MON AB 4-0 PC3 18 (SUTURE) ×3 IMPLANT
SUT NOVA NAB GS-21 0 18 T12 DT (SUTURE) IMPLANT
SUT SILK 2 0 SH (SUTURE) IMPLANT
SUT VIC AB 2-0 CT1 27 (SUTURE) ×6
SUT VIC AB 2-0 CT1 TAPERPNT 27 (SUTURE) ×1 IMPLANT
SUT VIC AB 3-0 CT1 27 (SUTURE) ×3
SUT VIC AB 3-0 CT1 TAPERPNT 27 (SUTURE) ×1 IMPLANT
SYR CONTROL 10ML LL (SYRINGE) ×3 IMPLANT
TOWEL OR 17X24 6PK STRL BLUE (TOWEL DISPOSABLE) ×1 IMPLANT
TOWEL OR 17X26 10 PK STRL BLUE (TOWEL DISPOSABLE) ×3 IMPLANT

## 2017-10-19 NOTE — Anesthesia Preprocedure Evaluation (Signed)
Anesthesia Evaluation  Patient identified by MRN, date of birth, ID band Patient awake    Reviewed: Allergy & Precautions, NPO status , Patient's Chart, lab work & pertinent test results  Airway Mallampati: II  TM Distance: >3 FB Neck ROM: Full    Dental  (+) Teeth Intact, Dental Advisory Given   Pulmonary former smoker,    breath sounds clear to auscultation       Cardiovascular hypertension,  Rhythm:Regular     Neuro/Psych    GI/Hepatic   Endo/Other  diabetes  Renal/GU      Musculoskeletal   Abdominal   Peds  Hematology   Anesthesia Other Findings   Reproductive/Obstetrics                             Anesthesia Physical Anesthesia Plan  ASA: III  Anesthesia Plan: General   Post-op Pain Management:  Regional for Post-op pain   Induction: Intravenous  PONV Risk Score and Plan: Ondansetron, Midazolam and Metaclopromide  Airway Management Planned: Oral ETT  Additional Equipment:   Intra-op Plan:   Post-operative Plan: Extubation in OR  Informed Consent: I have reviewed the patients History and Physical, chart, labs and discussed the procedure including the risks, benefits and alternatives for the proposed anesthesia with the patient or authorized representative who has indicated his/her understanding and acceptance.   Dental advisory given  Plan Discussed with: CRNA and Anesthesiologist  Anesthesia Plan Comments:         Anesthesia Quick Evaluation

## 2017-10-19 NOTE — Interval H&P Note (Signed)
History and Physical Interval Note: no change in H and P  10/19/2017 12:05 PM  Travis [name].  has presented today for surgery, with the diagnosis of RECURRENT LEFT INGUINAL HERNIA  The various methods of treatment have been discussed with the patient and family. After consideration of risks, benefits and other options for treatment, the patient has consented to  Procedure(s) with comments: LEFT INGUINAL HERNIA REPAIR WITH MESH (Left) - GENERAL AND TAP BLOCK INSERTION OF MESH (Left) - GENERAL AND TAP BLOCK as a surgical intervention .  The patient's history has been reviewed, patient examined, no change in status, stable for surgery.  I have reviewed the patient's chart and labs.  Questions were answered to the patient's satisfaction.     Cecylia Brazill A

## 2017-10-19 NOTE — Discharge Instructions (Signed)
[facility] _______[facility]  UMBILICAL OR INGUINAL HERNIA REPAIR: POST OP INSTRUCTIONS  Always review your discharge instruction sheet given to you by the facility where your surgery was performed. IF YOU HAVE DISABILITY OR FAMILY LEAVE FORMS, YOU MUST BRING THEM TO THE OFFICE FOR PROCESSING.   DO NOT GIVE THEM TO YOUR DOCTOR.  1. A  prescription for pain medication may be given to you upon discharge.  Take your pain medication as prescribed, if needed.  If narcotic pain medicine is not needed, then you may take acetaminophen (Tylenol) or ibuprofen (Advil) as needed. 2. Take your usually prescribed medications unless otherwise directed. If you need a refill on your pain medication, please contact your pharmacy.  They will contact our office to request authorization. Prescriptions will not be filled after 5 pm or on week-ends. 3. You should follow a light diet the first 24 hours after arrival home, such as soup and crackers, etc.  Be sure to include lots of fluids daily.  Resume your normal diet the day after surgery. 4.Most patients will experience some swelling and bruising around the umbilicus or in the groin and scrotum.  Ice packs and reclining will help.  Swelling and bruising can take several days to resolve.  6. It is common to experience some constipation if taking pain medication after surgery.  Increasing fluid intake and taking a stool softener (such as Colace) will usually help or prevent this problem from occurring.  A mild laxative (Milk of Magnesia or Miralax) should be taken according to package directions if there are no bowel movements after 48 hours. 7. Unless discharge instructions indicate otherwise, you may remove your bandages 24-48 hours after surgery, and you may shower at that time.  You may have steri-strips (small skin tapes) in place directly over the incision.  These strips should be left on the skin for 7-10 days.  If your surgeon used skin glue on the  incision, you may shower in 24 hours.  The glue will flake off over the next 2-3 weeks.  Any sutures or staples will be removed at the office during your follow-up visit. 8. ACTIVITIES:  You may resume regular (light) daily activities beginning the next day--such as daily self-care, walking, climbing stairs--gradually increasing activities as tolerated.  You may have sexual intercourse when it is comfortable.  Refrain from any heavy lifting or straining until approved by your doctor.  a.You may drive when you are no longer taking prescription pain medication, you can comfortably wear a seatbelt, and you can safely maneuver your car and apply brakes. b.RETURN TO WORK:   _____________________________________________  9.You should see your doctor in the office for a follow-up appointment approximately 2-3 weeks after your surgery.  Make sure that you call for this appointment within a day or two after you arrive home to insure a convenient appointment time. 10.OTHER INSTRUCTIONS: _NO LIFTING MORE THAN 15 POUNDS FOR 4 TO 6 WEEKS RESUME PLAVIX STARTING TOMORROW OK TO SHOWER STARTING TOMORROW ICE PACK AND TYLENOL ALSO FOR PAIN MIRALAX OR ANOTHER STOOL SOFTENER FOR CONSTIPATION________________________    _____________________________________  WHEN TO CALL YOUR DOCTOR: 1. Fever over 101.0 2. Inability to urinate 3. Nausea and/or vomiting 4. Extreme swelling or bruising 5. Continued bleeding from incision. 6. Increased pain, redness, or drainage from the incision  The clinic staff is available to answer your questions during regular business hours.  Please dont hesitate to call and ask to speak to one of the nurses for clinical  concerns.  If you have a medical emergency, go to the nearest emergency room or call 911.  A surgeon from [facility] is always on call at the hospital   [address], [place], [place], [place]  [address] ?  [address], [place], [place]   [address] [phone] ? [phone] ? FAX [phone] Web site: [email or web address]

## 2017-10-19 NOTE — Transfer of Care (Signed)
Immediate Anesthesia Transfer of Care Note  Patient: Travis [name].  Procedure(s) Performed: LEFT INGUINAL HERNIA REPAIR WITH MESH (Left Inguinal) INSERTION OF MESH LEFT INGUINAL HERNIA (Left Inguinal)  Patient Location: PACU  Anesthesia Type:General and Regional  Level of Consciousness: awake, alert  and oriented  Airway & Oxygen Therapy: Patient Spontanous Breathing and Patient connected to nasal cannula oxygen  Post-op Assessment: Report given to RN and Post -op Vital signs reviewed and stable  Post vital signs: Reviewed and stable  Last Vitals:  Vitals:   10/19/17 1205 10/19/17 1402  BP: 138/70   Pulse: 75   Resp: 17   Temp:  (P) 36.8 C  SpO2: 100%     Last Pain:  Vitals:   10/19/17 1402  TempSrc:   PainSc: (P) 0-No pain      Patients Stated Pain Goal: 2 (15/83/09 4076)  Complications: No apparent anesthesia complications

## 2017-10-19 NOTE — Anesthesia Postprocedure Evaluation (Signed)
Anesthesia Post Note  Patient: Travis [name].  Procedure(s) Performed: LEFT INGUINAL HERNIA REPAIR WITH MESH (Left Inguinal) INSERTION OF MESH LEFT INGUINAL HERNIA (Left Inguinal)     Patient location during evaluation: PACU Anesthesia Type: General Level of consciousness: awake and alert Pain management: pain level controlled Vital Signs Assessment: post-procedure vital signs reviewed and stable Respiratory status: spontaneous breathing, nonlabored ventilation, respiratory function stable and patient connected to nasal cannula oxygen Cardiovascular status: blood pressure returned to baseline and stable Postop Assessment: no apparent nausea or vomiting Anesthetic complications: no    Last Vitals:  Vitals:   10/19/17 1417 10/19/17 1440  BP: 140/82 (!) 141/82  Pulse: 77 83  Resp: (!) 21 16  Temp:  36.9 C  SpO2: 94% 95%    Last Pain:  Vitals:   10/19/17 1420  TempSrc:   PainSc: 0-No pain                 Tiajuana Amass

## 2017-10-19 NOTE — Op Note (Signed)
LEFT INGUINAL HERNIA REPAIR WITH MESH, INSERTION OF MESH LEFT INGUINAL HERNIA  Procedure Note  Travis [name]. 10/19/2017   Pre-op Diagnosis: RECURRENT LEFT INGUINAL HERNIA     Post-op Diagnosis: same  Procedure(s): LEFT INGUINAL HERNIA REPAIR WITH MESH INSERTION OF MESH LEFT INGUINAL HERNIA  Surgeon(s): Coralie Keens, MD  Anesthesia: General  Staff:  Circulator: Beryle Lathe, RN Relief Circulator: Nicholos Johns, RN Scrub Person: Rolan Bucco Circulator Assistant: Rozell Searing, RN  Estimated Blood Loss: Minimal               Findings: The patient was found to have [name] direct inguinal hernia on the left side sliding underneath the lower edge of the mesh.  It was repaired with the Prolene hernia system piece of mesh.  Procedure: The patient was brought to the operating room and identified as the correct patient.  He was placed supine on the operating table and general anesthesia was induced.  His abdomen was then prepped and draped in the usual sterile fashion.  I made [name] left lower quadrant incision through the patient's previous inguinal scar with [name] scalpel.  I took this down through Scarpa's fascia and the external oblique fascia which were fused together with the electrocautery.  It became apparent that the the patient had [name] direct inguinal hernia which was coming underneath the lower edge of the mesh at the shelving edge of the inguinal ligament medially.  At this point I split the old mesh down the middle and identify the testicular cord and structures and control this with [name] Penrose drain.  I then brought [name] piece of large Prolene mesh from the Prolene hernia system onto the field.  I cut the underlay portion slightly smaller and placed it through the fascial defect in the inguinal floor.  I then opened up the overlay portion on the inguinal floor.  I cut [name] slit in it to incorporate around the cord structures.  I then sutured the mesh in place with interrupted  2-0 Vicryl sutures.  I brought around the cord structures inserted and sutured it further around the internal ring.  At this point, there is good closure and repair appear to be achieved.  Hemostasis appeared to be achieved.  I then closed the external oblique fascia with the Scarpa's fascia with interrupted #1 Novafil sutures.  I anesthetized the skin with Marcaine and closed the subtenons tissue with interrupted 3-0 Vicryl sutures and closed the skin with [name] running 4-0 Monocryl.  Dermabond was then applied.  Patient tolerated the procedure well.  All the counts were correct at the end of the procedure.  The patient was then extubated in the operating room and taken in [name] stable condition to the recovery room.          Travis [name]   Date: 10/19/2017  Time: 1:53 PM

## 2017-10-19 NOTE — Anesthesia Procedure Notes (Signed)
Anesthesia Regional Block: TAP block   Pre-Anesthetic Checklist: ,, timeout performed, Correct Patient, Correct Site, Correct Laterality, Correct Procedure, Correct Position, site marked, Risks and benefits discussed,  Surgical consent,  Pre-op evaluation,  At surgeon's request and post-op pain management  Laterality: Left  Prep: chloraprep       Needles:  Injection technique: Single-shot  Needle Type: Stimulator Needle - 80          Additional Needles:   Procedures:,,,, ultrasound used (permanent image in chart),,,,  Narrative:  Start time: 10/19/2017 12:00 PM End time: 10/19/2017 12:05 PM Injection made incrementally with aspirations every 5 mL.  Performed by: Personally   Additional Notes: 20 cc 0.75% Ropivacaine  Injected easily

## 2017-10-19 NOTE — Anesthesia Procedure Notes (Signed)
Procedure Name: Intubation Date/Time: 10/19/2017 1:10 PM Performed by: Clearnce Sorrel, CRNA Pre-anesthesia Checklist: Patient identified, Emergency Drugs available, Suction available, Patient being monitored and Timeout performed Patient Re-evaluated:Patient Re-evaluated prior to induction Oxygen Delivery Method: Circle system utilized Preoxygenation: Pre-oxygenation with 100% oxygen Induction Type: IV induction Ventilation: Oral airway inserted - appropriate to patient size and Mask ventilation without difficulty Laryngoscope Size: Mac and 4 Grade View: Grade I Tube type: Oral Tube size: 7.5 mm Number of attempts: 1 Airway Equipment and Method: Stylet Placement Confirmation: ETT inserted through vocal cords under direct vision,  positive ETCO2 and breath sounds checked- equal and bilateral Secured at: 23 cm Tube secured with: Tape Dental Injury: Teeth and Oropharynx as per pre-operative assessment

## 2017-10-19 NOTE — H&P (Signed)
Travis [name] 09/20/2017 10:51 AM Location: [facility] Patient #: [id number] DOB: 1929/10/27 Married / Language: English / Race: White Male   History of Present Illness (Travis Fee A. Ninfa Linden MD; 09/20/2017 11:00 AM) The patient is a 84 year old male who presents with an inguinal hernia. This gentleman is here for long-term follow-up. I saw him last 1-1/2 years ago. I had performed a previous open hernia repair with mesh as well as an umbilical hernia repair with mesh on him in 2012. He has had a recurrence of the left inguinal hernia. It is been asymptomatic until recently when he began having increasing discomfort in the left groin. He reports the hernia will reduce. He describes the pain as moderate and sharp. He reports no issues from the asymptomatic tiny right inguinal hernia which is only been seen on CT scans in 2016  Past Medical History  Diagnosis Date  . Hypertension   . PSA elevation   . Arthritis   . Pre-diabetes   . Chronic kidney disease     stone 1990          Past Surgical History  Procedure Laterality Date  . Knee surgery    . Replacement total knee  11/20/10    left  . Joint replacement    . Inguinal hernia repair  10/06/2011    Procedure: HERNIA REPAIR INGUINAL ADULT;  Surgeon: Harl Bowie, MD;  Location: [facility];  Service: General;  Laterality: Left;  Open Left Inguinal Hernia Repair with Mesh  . Umbilical hernia repair  10/06/2011    Procedure: HERNIA REPAIR UMBILICAL ADULT;  Surgeon: Harl Bowie, MD;  Location: [facility];  Service: General;  Laterality: N/A;    Allergies Malachi Bonds, CMA; 09/20/2017 10:52 AM) No Known Drug Allergies 04/01/2016  Medication History Malachi Bonds, CMA; 09/20/2017 10:52 AM) Clopidogrel Bisulfate (75MG  Tablet, Oral) Active. Finasteride (5MG  Tablet, Oral) Active. HydroCHLOROthiazide (25MG  Tablet, Oral) Active. MetFORMIN HCl (500MG  Tablet, Oral) Active. Lisinopril (10MG   Tablet, Oral) Active. Terazosin HCl (10MG  Capsule, Oral) Active. Medications Reconciled  Vitals (Chemira Jones CMA; 09/20/2017 10:51 AM) 09/20/2017 10:51 AM Weight: 195 lb Height: 69in Body Surface Area: 2.04 m Body Mass Index: 28.8 kg/m  Temp.: 98.6F(Oral)  Pulse: 148 (Regular)  BP: 136/60 (Sitting, Left Arm, Standard)     Physical Exam (Romelia Bromell A. Ninfa Linden MD; 09/20/2017 11:00 AM) The physical exam findings are as follows: Note:On exam, he is well-appearing. His abdomen is soft. There is a tender but easily reducible left inguinal hernia. I am still uncertain if I can palpate a small right inguinal hernia. Generally well in appearance Lungs clear CV RRR Skin without rash    Assessment & Plan (Chanda Laperle A. Ninfa Linden MD; 09/20/2017 11:01 AM) RECURRENT LEFT INGUINAL HERNIA (K40.91) Impression: He reports that the recurrent left inguinal hernia is causing increasing discomfort so he would like to go ahead and proceed with an open repair with mesh. We again discussed laparoscopic repair which would allow the possibility of repair of the right side. He is interested in proceeding with just an open repair to limit the anesthesia there would be necessary. We can definitely do this with an LMA and tap block by anesthesia. I discussed the surgical procedure with him in detail. We discussed the risks and detail which includes but is not limited to bleeding, infection, injury to surrounding structures, nerve entrapment, chronic pain, cardiopulmonary issues, etc. We will have to get clearance from cardiology preoperatively. He understands and agrees to proceed

## 2017-10-20 ENCOUNTER — Encounter (HOSPITAL_COMMUNITY): Payer: Self-pay | Admitting: Surgery

## 2017-10-28 ENCOUNTER — Ambulatory Visit (INDEPENDENT_AMBULATORY_CARE_PROVIDER_SITE_OTHER): Payer: Medicare Other | Admitting: *Deleted

## 2017-10-28 DIAGNOSIS — I639 Cerebral infarction, unspecified: Secondary | ICD-10-CM

## 2017-11-01 NOTE — Progress Notes (Signed)
Carelink Summary Report / Loop Recorder 

## 2017-11-08 LAB — CUP PACEART REMOTE DEVICE CHECK
Date Time Interrogation Session: 20190111114111
Implantable Pulse Generator Implant Date: 20160922

## 2017-11-15 ENCOUNTER — Other Ambulatory Visit: Payer: Self-pay | Admitting: Cardiology

## 2017-11-23 ENCOUNTER — Other Ambulatory Visit: Payer: Self-pay | Admitting: Cardiology

## 2017-11-28 ENCOUNTER — Ambulatory Visit (INDEPENDENT_AMBULATORY_CARE_PROVIDER_SITE_OTHER): Payer: Medicare Other | Admitting: *Deleted

## 2017-11-28 DIAGNOSIS — I639 Cerebral infarction, unspecified: Secondary | ICD-10-CM | POA: Diagnosis not present

## 2017-11-29 NOTE — Progress Notes (Signed)
Carelink Summary Report / Loop Recorder 

## 2017-12-26 ENCOUNTER — Telehealth: Payer: Self-pay

## 2017-12-26 NOTE — Telephone Encounter (Signed)
Spoke with patient and assisted in remote transmission. Patient reports being asymptomatic and feeling "fine"

## 2017-12-27 LAB — CUP PACEART REMOTE DEVICE CHECK
MDC IDC PG IMPLANT DT: 20160922
MDC IDC SESS DTM: 20190210124035

## 2017-12-30 ENCOUNTER — Ambulatory Visit (INDEPENDENT_AMBULATORY_CARE_PROVIDER_SITE_OTHER): Payer: Medicare Other | Admitting: *Deleted

## 2017-12-30 DIAGNOSIS — I639 Cerebral infarction, unspecified: Secondary | ICD-10-CM

## 2017-12-30 NOTE — Progress Notes (Signed)
Carelink Summary Report / Loop Recorder 

## 2018-01-30 ENCOUNTER — Telehealth: Payer: Self-pay | Admitting: *Deleted

## 2018-01-30 NOTE — Telephone Encounter (Signed)
Spoke with patient to request a manual Carelink transmission for review.  3 tachy episodes noted from 01/29/18, patient active at time of episodes per device, median V rate 150bpm.  1 pause episode on 3/13 (previously reviewed), duration 3sec at 2029.  Patient asymptomatic with all episodes.  Patient denies questions or concerns at this time.  ECGs printed and placed in Dr. Kathalene Frames folder for review.

## 2018-02-01 ENCOUNTER — Ambulatory Visit (INDEPENDENT_AMBULATORY_CARE_PROVIDER_SITE_OTHER): Payer: Medicare Other | Admitting: *Deleted

## 2018-02-01 DIAGNOSIS — I639 Cerebral infarction, unspecified: Secondary | ICD-10-CM | POA: Diagnosis not present

## 2018-02-02 NOTE — Progress Notes (Signed)
Carelink Summary Report / Loop Recorder 

## 2018-02-04 LAB — CUP PACEART REMOTE DEVICE CHECK
Date Time Interrogation Session: 20190315123644
MDC IDC PG IMPLANT DT: 20160922

## 2018-02-23 ENCOUNTER — Other Ambulatory Visit: Payer: Self-pay | Admitting: Cardiology

## 2018-03-03 LAB — CUP PACEART REMOTE DEVICE CHECK
MDC IDC PG IMPLANT DT: 20160922
MDC IDC SESS DTM: 20190417154033

## 2018-03-06 ENCOUNTER — Ambulatory Visit (INDEPENDENT_AMBULATORY_CARE_PROVIDER_SITE_OTHER): Payer: Medicare Other | Admitting: *Deleted

## 2018-03-06 DIAGNOSIS — I639 Cerebral infarction, unspecified: Secondary | ICD-10-CM | POA: Diagnosis not present

## 2018-03-07 NOTE — Progress Notes (Signed)
Carelink Summary Report / Loop Recorder 

## 2018-03-28 LAB — CUP PACEART REMOTE DEVICE CHECK
Date Time Interrogation Session: 20190520160715
MDC IDC PG IMPLANT DT: 20160922

## 2018-04-10 ENCOUNTER — Ambulatory Visit (INDEPENDENT_AMBULATORY_CARE_PROVIDER_SITE_OTHER): Payer: Medicare Other | Admitting: *Deleted

## 2018-04-10 DIAGNOSIS — I639 Cerebral infarction, unspecified: Secondary | ICD-10-CM | POA: Diagnosis not present

## 2018-04-10 NOTE — Progress Notes (Signed)
Carelink Summary Report / Loop Recorder 

## 2018-05-11 ENCOUNTER — Ambulatory Visit (INDEPENDENT_AMBULATORY_CARE_PROVIDER_SITE_OTHER): Payer: Medicare Other | Admitting: *Deleted

## 2018-05-11 DIAGNOSIS — I639 Cerebral infarction, unspecified: Secondary | ICD-10-CM

## 2018-05-12 NOTE — Progress Notes (Signed)
Carelink Summary Report / Loop Recorder 

## 2018-05-23 LAB — CUP PACEART REMOTE DEVICE CHECK
Date Time Interrogation Session: 20190622174140
MDC IDC PG IMPLANT DT: 20160922

## 2018-06-13 ENCOUNTER — Ambulatory Visit (INDEPENDENT_AMBULATORY_CARE_PROVIDER_SITE_OTHER): Payer: Medicare Other | Admitting: *Deleted

## 2018-06-13 DIAGNOSIS — I639 Cerebral infarction, unspecified: Secondary | ICD-10-CM | POA: Diagnosis not present

## 2018-06-14 NOTE — Progress Notes (Signed)
Carelink Summary Report / Loop Recorder 

## 2018-06-20 ENCOUNTER — Telehealth: Payer: Self-pay | Admitting: *Deleted

## 2018-06-20 NOTE — Telephone Encounter (Signed)
Spoke with patient and wife. Requested manual Carelink transmission for review of tachy episode that did not transmit automatically. Discussed pause episode from 06/17/18 at 1613, duration 3sec--patient asymptomatic with episode. He denies any presyncope/syncope, or other cardiac symptoms. Patient agrees to send manual Carelink transmission for review. He denies additional questions or concerns at this time.

## 2018-06-23 NOTE — Telephone Encounter (Signed)
Spoke with patient to request manual Carelink transmission. He reports he forgot to send it a few days ago, but will transmit today. Discussed patient's tachy/pause episodes and he requests an appointment with Dr. Curt Bears to discuss further as he continues to have some occasional dizziness (none correlating with most recent pause episode). Advised patient that I will route this message to Dr. Macky Lower scheduler for assistance. He verbalizes appreciation and denies additional questions at this time.

## 2018-06-26 LAB — CUP PACEART REMOTE DEVICE CHECK
Date Time Interrogation Session: 20190725174032
MDC IDC PG IMPLANT DT: 20160922

## 2018-06-28 NOTE — Telephone Encounter (Signed)
Manual Transmission received. Tachy episode appears SVT, duration 5 minutes 24 seconds, Avg V rate 150 bpm.

## 2018-07-03 NOTE — Telephone Encounter (Signed)
Spoke with patient regarding pause episode noted on LINQ from 06/30/18 at 1357. Patient denies any symptoms with episode. Patient declined f/u when scheduler called last week. After further discussion, he is agreeable to f/u with Dr. Curt Bears as long as it is scheduled in December. He is agreeable for appointment on 09/19/18 at 11:30am. Patient is aware we will continue to monitor for episodes on LINQ. He denies questions or concerns at this time.  ECG printed and placed in Dr. Kathalene Frames folder for review.

## 2018-07-04 ENCOUNTER — Telehealth: Payer: Self-pay | Admitting: *Deleted

## 2018-07-04 NOTE — Telephone Encounter (Signed)
Spoke with patient regarding pause episode noted on LINQ from 07/03/18 at 1633, duration 4sec. Patient denies any symptoms with episode. He is aware we will continue to monitor remotely via Carelink and denies questions or concerns at this time.

## 2018-07-10 ENCOUNTER — Telehealth: Payer: Self-pay | Admitting: *Deleted

## 2018-07-10 NOTE — Telephone Encounter (Signed)
Manual transmission received and reviewed. 17 tachy episodes appears ST vs. SVT. Per device patient active at the time of episodes, longest 9 minutes 13 seconds, Avg V rate 150 bpm. Patient asymptomatic with episodes. ECGs printed and placed in Dr. Curt Bears folder for review.

## 2018-07-10 NOTE — Telephone Encounter (Signed)
Spoke with patient regarding sending a manual transmission to review 17 tachy episodes. Patient states no symptoms associated with tachy episodes. Patient reports he will send manual transmission. Advised patient we will review episodes once we receive the transmission. Patient verbalized understanding.

## 2018-07-11 LAB — CUP PACEART REMOTE DEVICE CHECK
Date Time Interrogation Session: 20190827183913
MDC IDC PG IMPLANT DT: 20160922

## 2018-07-17 ENCOUNTER — Ambulatory Visit (INDEPENDENT_AMBULATORY_CARE_PROVIDER_SITE_OTHER): Payer: Medicare Other | Admitting: *Deleted

## 2018-07-17 DIAGNOSIS — I639 Cerebral infarction, unspecified: Secondary | ICD-10-CM | POA: Diagnosis not present

## 2018-07-18 NOTE — Progress Notes (Signed)
Carelink Summary Report / Loop Recorder 

## 2018-07-19 LAB — CUP PACEART REMOTE DEVICE CHECK
Implantable Pulse Generator Implant Date: 20160922
MDC IDC SESS DTM: 20190929183802

## 2018-08-10 ENCOUNTER — Telehealth: Payer: Self-pay | Admitting: *Deleted

## 2018-08-10 NOTE — Telephone Encounter (Signed)
Spoke with patient regarding 14 tachy episodes noted on LINQ. Available ECG appears ST vs SVT, occurred on 08/09/18 at 1213, duration 27sec, median V rate 150bpm. 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 He agrees to send a manual transmission from his home monitor for review of additional episodes and is aware of upcoming appointment with Dr. Curt Bears on 09/19/18.

## 2018-08-16 NOTE — Telephone Encounter (Signed)
Manual Transmission received and reviewed. 14 tachy episodes appear ST/SVT, longest 6 minutes 22 seconds, Avg V rate 150 bpm. Will place in Dr. Macky Lower folder for review.

## 2018-08-18 ENCOUNTER — Ambulatory Visit (INDEPENDENT_AMBULATORY_CARE_PROVIDER_SITE_OTHER): Payer: Medicare Other | Admitting: *Deleted

## 2018-08-18 DIAGNOSIS — I639 Cerebral infarction, unspecified: Secondary | ICD-10-CM

## 2018-08-20 NOTE — Progress Notes (Signed)
Carelink Summary Report / Loop Recorder 

## 2018-09-01 ENCOUNTER — Other Ambulatory Visit: Payer: Self-pay | Admitting: Cardiology

## 2018-09-08 LAB — CUP PACEART REMOTE DEVICE CHECK
Implantable Pulse Generator Implant Date: 20160922
MDC IDC SESS DTM: 20191101183634

## 2018-09-18 NOTE — Progress Notes (Signed)
Electrophysiology Office Note   Date:  09/19/2018   ID:  [name], DOB [birth year], MRN [id number]  PCP:  Leonard Downing, MD   Primary Electrophysiologist:  Constance Haw, MD    No chief complaint on file.    History of Present Illness: Travis [name]. is a 84 y.o. male who presents today for electrophysiology evaluation.   He presents today for follow-up of his Linq monitor. He had a cryptogenic stroke in September 2016.his symptoms at that time were nausea, vomiting, and gait difficulties. He had also been having difficulty with memory. At the time he was in sinus rhythm and had an echocardiogram which showed a normal EF and no wall motion abnormalities. He does continue to feel dizzy. He is able to work in the yard and do activities around the house, and his dizziness is only been mild. He is recovering from his stroke and doing well and not having any major complaints.   Today, denies symptoms of palpitations, chest pain, shortness of breath, orthopnea, PND, lower extremity edema, claudication, dizziness, presyncope, syncope, bleeding, or neurologic sequela. The patient is tolerating medications without difficulties.  He is overall feeling well.  His main complaints today are of knee pain and pain from his hernia.  He has no chest pain or shortness of breath.   Past Medical History:  Diagnosis Date  . Arthritis   . Chronic kidney disease    stone 1990  . Diabetes mellitus without complication ([place])   . History of kidney stones   . Hypertension   . PSA elevation   . Recurrent inguinal hernia    Left  . Stroke [facility])    Past Surgical History:  Procedure Laterality Date  . EP IMPLANTABLE DEVICE N/A 07/10/2015   Procedure: Loop Recorder Insertion;  Surgeon:  Meredith Leeds, MD;  Location: [facility];  Service: Cardiovascular;  Laterality: N/A;  . EYE SURGERY Right   . INGUINAL HERNIA REPAIR  10/06/2011   Procedure: HERNIA REPAIR INGUINAL  ADULT;  Surgeon: Harl Bowie, MD;  Location: [facility];  Service: General;  Laterality: Left;  Open Left Inguinal Hernia Repair with Mesh  . INGUINAL HERNIA REPAIR Left 10/19/2017   Procedure: LEFT INGUINAL HERNIA REPAIR WITH MESH;  Surgeon: Coralie Keens, MD;  Location: [place];  Service: General;  Laterality: Left;  GENERAL AND TAP BLOCK  . INSERTION OF MESH Left 10/19/2017   Procedure: INSERTION OF MESH LEFT INGUINAL HERNIA;  Surgeon: Coralie Keens, MD;  Location: [place] [facility];  Service: General;  Laterality: Left;  GENERAL AND TAP BLOCK  . JOINT REPLACEMENT    . KNEE SURGERY    . REPLACEMENT TOTAL KNEE  11/20/10   left  . TONSILLECTOMY    . UMBILICAL HERNIA REPAIR  10/06/2011   Procedure: HERNIA REPAIR UMBILICAL ADULT;  Surgeon: Harl Bowie, MD;  Location: [facility];  Service: General;  Laterality: N/A;     Current Outpatient Medications  Medication Sig Dispense Refill  . clopidogrel (PLAVIX) 75 MG tablet Take 1 tablet by mouth  daily (Patient taking differently: Take 75 mg by mouth daily) 90 tablet 3  . finasteride (PROSCAR) 5 MG tablet Take 5 mg by mouth daily.    . hydrochlorothiazide (HYDRODIURIL) 25 MG tablet TAKE 1 TABLET BY MOUTH  DAILY 90 tablet 3  . ibuprofen (ADVIL,MOTRIN) 200 MG tablet Take 100 mg by mouth every 6 (six) hours as needed for headache or moderate pain.     Marland Kitchen  lisinopril (PRINIVIL,ZESTRIL) 10 MG tablet Take 1 tablet (10 mg total) by mouth daily. 90 tablet 3  . metFORMIN (GLUCOPHAGE) 500 MG tablet Take 1 tablet (500 mg total) by mouth 2 (two) times daily with a meal. (Patient taking differently: Take 250 mg by mouth 2 (two) times daily with a meal. ) 60 tablet 0  . oxyCODONE (OXY IR/ROXICODONE) 5 MG immediate release tablet Take 1-2 tablets (5-10 mg total) by mouth every 6 (six) hours as needed for moderate pain, severe pain or breakthrough pain. 25 tablet 0  . terazosin (HYTRIN) 10 MG capsule Take 1 capsule (10 mg total) by mouth at bedtime. 90 capsule 3   No  current facility-administered medications for this visit.     Allergies:   Metformin and related   Social History:  The patient  reports that he quit smoking about 56 years ago. His smoking use included cigarettes and cigars. He has a 36.00 pack-year smoking history. He has quit using smokeless tobacco.  His smokeless tobacco use included chew. He reports that he drinks about 1.0 standard drinks of alcohol per week. He reports that he does not use drugs.   Family History:  The patient's family history includes Heart disease in his father.    ROS:  Please see the history of present illness.   Otherwise, review of systems is positive for none.   All other systems are reviewed and negative.   PHYSICAL EXAM: VS:  BP 118/64   Pulse 83   Ht 5\' 9"  (1.753 m)   Wt 199 lb 6.4 oz (90.4 kg)   SpO2 97%   BMI 29.45 kg/m  , BMI Body mass index is 29.45 kg/m. GEN: Well nourished, well developed, in no acute distress  HEENT: normal  Neck: no JVD, carotid bruits, or masses Cardiac: RRR; no murmurs, rubs, or gallops,no edema  Respiratory:  clear to auscultation bilaterally, normal work of breathing GI: soft, nontender, nondistended, + BS MS: no deformity or atrophy  Skin: warm and dry, device site well healed Neuro:  Strength and sensation are intact Psych: euthymic mood, full affect  EKG:  EKG is ordered today. Personal review of the ekg ordered shows SR, RBBB, LAFB, rate 83  Personal review of the device interrogation today. Results in [facility]: 10/17/2017: BUN 20; Creatinine, Ser 1.20; Hemoglobin 12.8; Platelets 239; Potassium 4.0; Sodium 134    Lipid Panel     Component Value Date/Time   CHOL 138 07/09/2015 0405   TRIG 124 07/09/2015 0405   HDL 37 (L) 07/09/2015 0405   CHOLHDL 3.7 07/09/2015 0405   VLDL 25 07/09/2015 0405   LDLCALC 76 07/09/2015 0405     Wt Readings from Last 3 Encounters:  09/19/18 199 lb 6.4 oz (90.4 kg)  10/19/17 196 lb (88.9 kg)  10/17/17 196  lb 3.2 oz (89 kg)      Other studies Reviewed: Additional studies/ records that were reviewed today include: TTE 07/08/15 Review of the above records today demonstrates:  - Left ventricle: The cavity size was normal. Systolic function was normal. The estimated ejection fraction was in the range of 55% to 60%. Wall motion was normal; there were no regional wall motion abnormalities. - Aortic valve: Trileaflet; mildly thickened, mildly calcified leaflets. There was mild regurgitation. - Aorta: Aortic root dimension: 40 mm (ED). Ascending aorta diameter: 42 mm (ED). - Aortic root: The aortic root was mildly dilated. - Ascending aorta: The ascending aorta was mildly dilated.  ASSESSMENT AND PLAN:  1.  Cryptogenic stroke: No further strokelike symptoms.  He has not had any atrial fibrillation noted on his Linq monitor.  He does not want [place] to continue to follow this.  We  stop remote monitoring.   2.  SVT with intermittent pauses: Currently asymptomatic.  Patient does not wish any further therapy.  No changes.  Current medicines are reviewed at length with the patient today.   The patient does not have concerns regarding his medicines.  The following changes were made today:  none  Labs/ tests ordered today include:  Orders Placed This Encounter  Procedures  . EKG 12-Lead     Disposition:   FU with   PRN year  Signed,  Meredith Leeds, MD  09/19/2018 1:57 PM     [address] [place] [address] [phone] (office) [phone] (fax)

## 2018-09-19 ENCOUNTER — Encounter: Payer: Self-pay | Admitting: Cardiology

## 2018-09-19 ENCOUNTER — Ambulatory Visit: Payer: Medicare Other | Admitting: Cardiology

## 2018-09-19 VITALS — BP 118/64 | HR 83 | Ht 69.0 in | Wt 199.4 lb

## 2018-09-19 DIAGNOSIS — I639 Cerebral infarction, unspecified: Secondary | ICD-10-CM

## 2018-09-19 DIAGNOSIS — I471 Supraventricular tachycardia: Secondary | ICD-10-CM

## 2018-09-19 MED ORDER — LISINOPRIL 10 MG PO TABS: 10.0000 mg | ORAL_TABLET | Freq: Every day | ORAL | 3 refills | Status: DC

## 2018-09-19 NOTE — Patient Instructions (Signed)
Medication Instructions:  Your physician recommends that you continue on your current medications as directed. Please refer to the Current Medication list given to you today.  * If you need a refill on your cardiac medications before your next appointment, please call your pharmacy.   Labwork: None ordered  Testing/Procedures: None ordered  Follow-Up: No follow up is needed at this time with Dr. Camnitz.  He will see you on an as needed basis.   Thank you for choosing [facility]!!   Notnamed Croucher, RN [phone]     

## 2018-09-20 ENCOUNTER — Ambulatory Visit (INDEPENDENT_AMBULATORY_CARE_PROVIDER_SITE_OTHER): Payer: Medicare Other

## 2018-09-20 ENCOUNTER — Telehealth: Payer: Self-pay | Admitting: *Deleted

## 2018-09-20 DIAGNOSIS — I639 Cerebral infarction, unspecified: Secondary | ICD-10-CM | POA: Diagnosis not present

## 2018-09-20 NOTE — Telephone Encounter (Signed)
Spoke with patient regarding pause episode noted on LINQ from 09/19/18 at 1949, duration 4 sec. Patient reports he was asymptomatic with episode. Per Dr. Macky Lower OV note from 09/19/18, patient does not wish any therapy for his SVT or pauses at this time. Will ask Dr. Curt Bears if ok to discontinue remote monitoring.

## 2018-09-21 NOTE — Progress Notes (Signed)
Carelink Summary Report / Loop Recorder 

## 2018-10-05 NOTE — Telephone Encounter (Signed)
If the patient wishes no further monitoring, agree with stopping remotes.

## 2018-10-06 LAB — CUP PACEART INCLINIC DEVICE CHECK
Date Time Interrogation Session: 20191203202140
MDC IDC PG IMPLANT DT: 20160922

## 2018-10-20 ENCOUNTER — Other Ambulatory Visit: Payer: Self-pay | Admitting: Cardiology

## 2018-10-23 ENCOUNTER — Ambulatory Visit: Payer: Medicare Other

## 2018-10-24 ENCOUNTER — Telehealth: Payer: Self-pay | Admitting: Cardiology

## 2018-10-24 LAB — CUP PACEART REMOTE DEVICE CHECK
Implantable Pulse Generator Implant Date: 20160922
MDC IDC SESS DTM: 20200106190641

## 2018-10-24 NOTE — Telephone Encounter (Signed)
Spoke with patient. Advised that 12/4 was last filed charge. Advised that I will unenroll patient from [employer] monitoring and order a return kit to his home address on file. Patient is agreeable to this plan and appreciative of call.

## 2018-10-24 NOTE — Progress Notes (Signed)
Carelink Summary Report / Loop Recorder 

## 2018-10-24 NOTE — Telephone Encounter (Signed)
See phone note from 10/24/18. Encounter closed.

## 2018-10-24 NOTE — Telephone Encounter (Signed)
See phone note dated 09/20/18. Per Dr. Curt Bears, ok to d/c Carelink monitoring per patient request. Last billed summary report was on 09/20/18. Upcoming reports canceled.

## 2018-10-24 NOTE — Telephone Encounter (Signed)
New message:   Patient states that he got bill concerning his device he states that he has ask for them to stop checking this device. Now he keeps getting bills. Please call patient.

## 2018-10-25 ENCOUNTER — Other Ambulatory Visit: Payer: Self-pay | Admitting: Cardiology

## 2018-11-04 LAB — CUP PACEART REMOTE DEVICE CHECK
Implantable Pulse Generator Implant Date: 20160922
MDC IDC SESS DTM: 20191204183818

## 2018-11-23 ENCOUNTER — Other Ambulatory Visit: Payer: Self-pay | Admitting: Cardiology

## 2019-01-01 ENCOUNTER — Other Ambulatory Visit: Payer: Self-pay | Admitting: Cardiology

## 2019-07-09 ENCOUNTER — Other Ambulatory Visit: Payer: Self-pay | Admitting: Cardiology

## 2019-12-16 ENCOUNTER — Other Ambulatory Visit: Payer: Self-pay | Admitting: Cardiology

## 2020-02-25 ENCOUNTER — Emergency Department (HOSPITAL_COMMUNITY)
Admission: EM | Admit: 2020-02-25 | Discharge: 2020-02-25 | Disposition: A | Discharge status: Home / Self Care | Payer: Medicare Other | Attending: Emergency Medicine | Admitting: Emergency Medicine

## 2020-02-25 ENCOUNTER — Encounter (HOSPITAL_COMMUNITY): Payer: Self-pay | Admitting: Emergency Medicine

## 2020-02-25 ENCOUNTER — Other Ambulatory Visit: Payer: Self-pay

## 2020-02-25 ENCOUNTER — Emergency Department (HOSPITAL_COMMUNITY): Payer: Medicare Other

## 2020-02-25 DIAGNOSIS — Z8673 Personal history of transient ischemic attack (TIA), and cerebral infarction without residual deficits: Secondary | ICD-10-CM | POA: Insufficient documentation

## 2020-02-25 DIAGNOSIS — S0990XA Unspecified injury of head, initial encounter: Secondary | ICD-10-CM

## 2020-02-25 DIAGNOSIS — S0003XA Contusion of scalp, initial encounter: Secondary | ICD-10-CM | POA: Insufficient documentation

## 2020-02-25 DIAGNOSIS — Y999 Unspecified external cause status: Secondary | ICD-10-CM | POA: Diagnosis not present

## 2020-02-25 DIAGNOSIS — S199XXA Unspecified injury of neck, initial encounter: Secondary | ICD-10-CM | POA: Diagnosis not present

## 2020-02-25 DIAGNOSIS — Y9301 Activity, walking, marching and hiking: Secondary | ICD-10-CM | POA: Diagnosis not present

## 2020-02-25 DIAGNOSIS — I10 Essential (primary) hypertension: Secondary | ICD-10-CM | POA: Insufficient documentation

## 2020-02-25 DIAGNOSIS — Y929 Unspecified place or not applicable: Secondary | ICD-10-CM | POA: Insufficient documentation

## 2020-02-25 DIAGNOSIS — M542 Cervicalgia: Secondary | ICD-10-CM | POA: Insufficient documentation

## 2020-02-25 DIAGNOSIS — Z7902 Long term (current) use of antithrombotics/antiplatelets: Secondary | ICD-10-CM | POA: Insufficient documentation

## 2020-02-25 DIAGNOSIS — W010XXA Fall on same level from slipping, tripping and stumbling without subsequent striking against object, initial encounter: Secondary | ICD-10-CM | POA: Diagnosis not present

## 2020-02-25 MED ORDER — LISINOPRIL 10 MG PO TABS
10.0000 mg | ORAL_TABLET | Freq: Once | ORAL | Status: AC
  Administered 2020-02-25: 17:00:00 10 mg via ORAL
  Filled 2020-02-25: qty 1

## 2020-02-25 NOTE — ED Triage Notes (Signed)
Pt. Stated, I fell about a hour and half ago. I was going into the house and grabbed the handle and then the other hand I pulled back and then I fell and hit the rt. Side of my head, A knot on that area.  Pt takes Plavix 75mg .

## 2020-02-25 NOTE — ED Provider Notes (Signed)
[facility] EMERGENCY DEPARTMENT Provider Note   CSN: [id number] Arrival date & time: 02/25/20  1615     History Chief Complaint  Patient presents with  . Fall    Princess Bruins Metayer Brooke Bonito. is a 86 y.o. male history of diabetes, HTN, here presenting with head injury.  Patient states that he was walking and had a mechanical fall and hit his head.  He denies passing out or chest pain or shortness of breath prior to the fall.  Patient states that he takes Plavix due to previous stroke.  Patient denies any headaches or vomiting.  Patient was noted to be hypertensive in the ED but took his blood pressure medicine this morning.  The history is provided by the patient.       Past Medical History:  Diagnosis Date  . Arthritis   . Chronic kidney disease    stone 1990  . Diabetes mellitus without complication ([place])   . History of kidney stones   . Hypertension   . PSA elevation   . Recurrent inguinal hernia    Left  . Stroke [facility])     Patient Active Problem List   Diagnosis Date Noted  . SVT (supraventricular tachycardia) ([place]) 09/29/2017  . Sinus pause 09/29/2017  . Preoperative clearance 09/29/2017  . Cryptogenic stroke ([place]) 09/22/2015  . HLD (hyperlipidemia)   . Cerebellar infarct ([place])   . Cerebral thrombosis with cerebral infarction ([place]) 07/08/2015  . Nausea & vomiting 07/05/2015  . RBBB 07/05/2015  . Rash 07/05/2015  . Sepsis ([place]) 07/05/2015  . Hypertension   . Arthritis   . Pre-diabetes   . Essential hypertension   . Nausea and vomiting in adult   . Left inguinal hernia 09/13/2011    Past Surgical History:  Procedure Laterality Date  . EP IMPLANTABLE DEVICE N/A 07/10/2015   Procedure: Loop Recorder Insertion;  Surgeon: Will Meredith Leeds, MD;  Location: [facility];  Service: Cardiovascular;  Laterality: N/A;  . EYE SURGERY Right   . INGUINAL HERNIA REPAIR  10/06/2011   Procedure: HERNIA REPAIR INGUINAL ADULT;  Surgeon: Harl Bowie, MD;  Location: [facility];  Service: General;  Laterality: Left;  Open Left Inguinal Hernia Repair with Mesh  . INGUINAL HERNIA REPAIR Left 10/19/2017   Procedure: LEFT INGUINAL HERNIA REPAIR WITH MESH;  Surgeon: Coralie Keens, MD;  Location: [facility];  Service: General;  Laterality: Left;  GENERAL AND TAP BLOCK  . INSERTION OF MESH Left 10/19/2017   Procedure: INSERTION OF MESH LEFT INGUINAL HERNIA;  Surgeon: Coralie Keens, MD;  Location: [facility];  Service: General;  Laterality: Left;  GENERAL AND TAP BLOCK  . JOINT REPLACEMENT    . KNEE SURGERY    . REPLACEMENT TOTAL KNEE  11/20/10   left  . TONSILLECTOMY    . UMBILICAL HERNIA REPAIR  10/06/2011   Procedure: HERNIA REPAIR UMBILICAL ADULT;  Surgeon: Harl Bowie, MD;  [facility];  Service: General;  Laterality: N/A;       Family History  Problem Relation Age of Onset  . Heart disease Father     Social History   Tobacco Use  . Smoking status: Former Smoker    Packs/day: 2.00    Years: 18.00    Pack years: 36.00    Types: Cigarettes, Cigars    Quit date: 10/03/1962    Years since quitting: 57.4  . Smokeless tobacco: Former Systems developer    Types: Chew  Substance Use Topics  .  Alcohol use: Yes    Alcohol/week: 1.0 standard drinks    Types: 1 Shots of liquor per week    Comment: vodka socially  . Drug use: No    Home Medications Prior to Admission medications   Medication Sig Start Date End Date Taking? Authorizing Provider  clopidogrel (PLAVIX) 75 MG tablet Take 1 tablet by mouth  daily Patient taking differently: Take 75 mg by mouth daily 03/19/16   Camnitz, Ocie Doyne, MD  finasteride (PROSCAR) 5 MG tablet Take 5 mg by mouth daily.    [provider]  hydrochlorothiazide (HYDRODIURIL) 25 MG tablet TAKE 1 TABLET BY MOUTH  DAILY 12/18/19   Camnitz, Ocie Doyne, MD  ibuprofen (ADVIL,MOTRIN) 200 MG tablet Take 100 mg by mouth every 6 (six) hours as needed for headache or moderate pain.     [provider]  lisinopril (ZESTRIL) 10 MG tablet TAKE 1 TABLET BY MOUTH  DAILY 07/09/19   Camnitz, Ocie Doyne, MD  metFORMIN (GLUCOPHAGE) 500 MG tablet Take 1 tablet (500 mg total) by mouth 2 (two) times daily with a meal. Patient taking differently: Take 250 mg by mouth 2 (two) times daily with a meal.  07/11/15   Florencia Reasons, MD  oxyCODONE (OXY IR/ROXICODONE) 5 MG immediate release tablet Take 1-2 tablets (5-10 mg total) by mouth every 6 (six) hours as needed for moderate pain, severe pain or breakthrough pain. 10/19/17   Coralie Keens, MD  terazosin (HYTRIN) 10 MG capsule Take 1 capsule (10 mg total) by mouth at bedtime. 01/30/16   Camnitz, Ocie Doyne, MD    Allergies    Metformin and related  Review of Systems   Review of Systems  All other systems reviewed and are negative.   Physical Exam Updated Vital Signs BP (!) 197/81 (BP Location: Left Arm)   Pulse 62   Temp 98.7 F (37.1 C) (Oral)   Resp 17   Ht 5\' 9"  (1.753 m)   Wt 90.3 kg   SpO2 97%   BMI 29.39 kg/m   Physical Exam Vitals and nursing note reviewed.  Constitutional:      Comments: Chronically ill but no acutely ill   HENT:     Head: Normocephalic.     Comments: R scalp hematoma, no obvious laceration or abrasions     Nose: Nose normal.     Mouth/Throat:     Mouth: Mucous membranes are moist.  Eyes:     Extraocular Movements: Extraocular movements intact.     Pupils: Pupils are equal, round, and reactive to light.  Neck:     Comments: R paracervical tenderness, no midline tenderness, nl ROM of the neck  Cardiovascular:     Rate and Rhythm: Normal rate.     Pulses: Normal pulses.  Pulmonary:     Effort: Pulmonary effort is normal.     Breath sounds: Normal breath sounds.  Abdominal:     General: Abdomen is flat.     Palpations: Abdomen is soft.  Musculoskeletal:        General: Normal range of motion.  Skin:    General: Skin is warm.     Capillary Refill: Capillary refill takes less than 2 seconds.    Neurological:     General: No focal deficit present.     Mental Status: He is alert and oriented to person, place, and time.     Cranial Nerves: No cranial nerve deficit.     Sensory: No sensory deficit.  Motor: No weakness.     Coordination: Coordination normal.  Psychiatric:        Mood and Affect: Mood normal.        Behavior: Behavior normal.     ED Results / Procedures / Treatments   Labs (all labs ordered are listed, but only abnormal results are displayed) Labs Reviewed - No data to display  EKG None  Radiology CT Head Wo Contrast  Result Date: 02/25/2020 CLINICAL DATA:  Recent fall with left-sided headaches and neck pain, initial encounter EXAM: CT HEAD WITHOUT CONTRAST CT CERVICAL SPINE WITHOUT CONTRAST TECHNIQUE: Multidetector CT imaging of the head and cervical spine was performed following the standard protocol without intravenous contrast. Multiplanar CT image reconstructions of the cervical spine were also generated. COMPARISON:  07/17/2015 FINDINGS: CT HEAD FINDINGS Brain: There are changes consistent with prior left cerebellar infarct medially focal volume loss is noted related to the infarct. Mild atrophic changes and chronic white matter ischemic changes are seen. No findings to suggest acute hemorrhage, acute infarction or space-occupying mass lesion is seen. Vascular: No hyperdense vessel or unexpected calcification. Skull: Normal. Negative for fracture or focal lesion. Sinuses/Orbits: Mucosal retention cysts are noted in the maxillary antra bilaterally. Other: None. CT CERVICAL SPINE FINDINGS Alignment: Within normal limits. Skull base and vertebrae: 7 cervical segments are well visualized. Vertebral body height is well maintained. Multilevel facet hypertrophic changes and osteophytic changes are noted. Mild retrolisthesis of C3 with respect to C2 and C4 is noted. Mild disc space narrowing is noted most marked at C3-4, C5-6 and C6-7. Soft tissues and spinal canal:  Surrounding soft tissue structures are within normal limits. Upper chest: Within normal limits as visualized. Other: None IMPRESSION: CT of the head: Prior left cerebellar infarct. Chronic atrophic and ischemic changes. CT of the cervical spine: Multilevel degenerative change without acute abnormality. Electronically Signed   By: Inez Catalina M.D.   On: 02/25/2020 18:30   CT Cervical Spine Wo Contrast  Result Date: 02/25/2020 CLINICAL DATA:  Recent fall with left-sided headaches and neck pain, initial encounter EXAM: CT HEAD WITHOUT CONTRAST CT CERVICAL SPINE WITHOUT CONTRAST TECHNIQUE: Multidetector CT imaging of the head and cervical spine was performed following the standard protocol without intravenous contrast. Multiplanar CT image reconstructions of the cervical spine were also generated. COMPARISON:  07/17/2015 FINDINGS: CT HEAD FINDINGS Brain: There are changes consistent with prior left cerebellar infarct medially focal volume loss is noted related to the infarct. Mild atrophic changes and chronic white matter ischemic changes are seen. No findings to suggest acute hemorrhage, acute infarction or space-occupying mass lesion is seen. Vascular: No hyperdense vessel or unexpected calcification. Skull: Normal. Negative for fracture or focal lesion. Sinuses/Orbits: Mucosal retention cysts are noted in the maxillary antra bilaterally. Other: None. CT CERVICAL SPINE FINDINGS Alignment: Within normal limits. Skull base and vertebrae: 7 cervical segments are well visualized. Vertebral body height is well maintained. Multilevel facet hypertrophic changes and osteophytic changes are noted. Mild retrolisthesis of C3 with respect to C2 and C4 is noted. Mild disc space narrowing is noted most marked at C3-4, C5-6 and C6-7. Soft tissues and spinal canal: Surrounding soft tissue structures are within normal limits. Upper chest: Within normal limits as visualized. Other: None IMPRESSION: CT of the head: Prior left  cerebellar infarct. Chronic atrophic and ischemic changes. CT of the cervical spine: Multilevel degenerative change without acute abnormality. Electronically Signed   By: Inez Catalina M.D.   On: 02/25/2020 18:30    Procedures Procedures (  including critical care time)  Medications Ordered in ED Medications  lisinopril (ZESTRIL) tablet 10 mg (10 mg Oral Given 02/25/20 1652)    ED Course  I have reviewed the triage vital signs and the nursing notes.  Pertinent labs & imaging results that were available during my care of the patient were reviewed by me and considered in my medical decision making (see chart for details).    MDM Rules/Calculators/A&P                      Princess Bruins Haile Brooke Bonito. is a 86 y.o. male here with fall. Patient has R scalp hematoma and is on plavix. Nonfocal neuro exam. Will get CT head/neck. Hypertensive in the ED and is on BP meds so will give extra dose of BP meds.   6:37 PM CT head/neck unremarkable. BP improved to 190s after BP meds. Told him to recheck BP with PCP in a week and continue BP meds.   Final Clinical Impression(s) / ED Diagnoses Final diagnoses:  None    Rx / DC Orders ED Discharge Orders    None       Drenda Freeze, MD 02/25/20 [id number]

## 2020-02-25 NOTE — ED Notes (Signed)
Pt verbalized understanding of discharge instructions. Follow up care reviewed, pt had no further questions. Brought to lobby w/ granddaughter.

## 2020-02-25 NOTE — Discharge Instructions (Signed)
Your CT scan today did not show any bleeding or fractures.  Take Tylenol for pain.  Blood pressure is elevated so continue blood pressure medicine.  Recheck your blood pressure with your doctor in a week.  If your blood pressure is still elevated, you may need to increase your blood pressure medicine.  Return to ER if you have worse headaches, vomiting, weakness.

## 2020-10-18 DIAGNOSIS — C801 Malignant (primary) neoplasm, unspecified: Secondary | ICD-10-CM

## 2020-10-18 HISTORY — DX: Malignant (primary) neoplasm, unspecified: C80.1

## 2020-10-20 ENCOUNTER — Other Ambulatory Visit: Payer: Self-pay | Admitting: Cardiology

## 2020-11-22 ENCOUNTER — Other Ambulatory Visit: Payer: Self-pay | Admitting: Cardiology

## 2020-12-06 ENCOUNTER — Other Ambulatory Visit: Payer: Self-pay | Admitting: Cardiology

## 2020-12-10 ENCOUNTER — Other Ambulatory Visit: Payer: Self-pay | Admitting: Cardiology

## 2020-12-10 NOTE — Telephone Encounter (Signed)
Pt only follows up with our office PRN, has not been seen since 2019.  PT will need to have renewal prescription authorized by PCP

## 2021-08-05 ENCOUNTER — Other Ambulatory Visit: Payer: Self-pay

## 2021-08-05 ENCOUNTER — Ambulatory Visit: Payer: Medicare Other | Admitting: Plastic Surgery

## 2021-08-05 ENCOUNTER — Encounter: Payer: Self-pay | Admitting: Plastic Surgery

## 2021-08-05 VITALS — BP 157/73 | HR 69 | Ht 68.5 in | Wt 198.2 lb

## 2021-08-05 DIAGNOSIS — C4442 Squamous cell carcinoma of skin of scalp and neck: Secondary | ICD-10-CM | POA: Diagnosis not present

## 2021-08-05 NOTE — Progress Notes (Signed)
Referring Provider Leonard Downing, MD [address] [place],  [place] [address]   CC:  Chief Complaint  Patient presents with   Advice Only      Travis [name]. is an 87 y.o. male.  HPI: Patient presents to discuss reconstruction of a planned upcoming Mohs excision of squamous cell carcinoma of the scalp.  He has had numerous skin cancers in the past that have been treated with a variety of methods including topical treatments and excisions.  No previous radiation to the scalp.  Allergies  Allergen Reactions   Metformin And Related Other (See Comments)    Stomach ulcers    Outpatient Encounter Medications as of 08/05/2021  Medication Sig   amLODipine (NORVASC) 10 MG tablet Take 1 tablet by mouth daily.   clopidogrel (PLAVIX) 75 MG tablet Take 1 tablet by mouth  daily   finasteride (PROSCAR) 5 MG tablet Take 5 mg by mouth daily.   hydrochlorothiazide (HYDRODIURIL) 25 MG tablet Take 1 tablet (25 mg total) by mouth daily. Please have PCP authorize renewal maintenance prescription   ibuprofen (ADVIL,MOTRIN) 200 MG tablet Take 100 mg by mouth every 6 (six) hours as needed for headache or moderate pain.    lisinopril (ZESTRIL) 10 MG tablet TAKE 1 TABLET BY MOUTH  DAILY   metFORMIN (GLUCOPHAGE) 500 MG tablet Take 1 tablet (500 mg total) by mouth 2 (two) times daily with a meal. (Patient taking differently: Take 250 mg by mouth 2 (two) times daily with a meal.)   terazosin (HYTRIN) 10 MG capsule Take 1 capsule (10 mg total) by mouth at bedtime.   [DISCONTINUED] oxyCODONE (OXY IR/ROXICODONE) 5 MG immediate release tablet Take 1-2 tablets (5-10 mg total) by mouth every 6 (six) hours as needed for moderate pain, severe pain or breakthrough pain.   No facility-administered encounter medications on file as of 08/05/2021.     Past Medical History:  Diagnosis Date   Arthritis    Chronic kidney disease    stone 1990   Diabetes mellitus without complication ([place])    History of  kidney stones    Hypertension    PSA elevation    Recurrent inguinal hernia    Left   Stroke [facility])     Past Surgical History:  Procedure Laterality Date   EP IMPLANTABLE DEVICE N/A 07/10/2015   Procedure: Loop Recorder Insertion;  Surgeon: Will Meredith Leeds, MD;  Location: [facility];  Service: Cardiovascular;  Laterality: N/A;   EYE SURGERY Right    INGUINAL HERNIA REPAIR  10/06/2011   Procedure: HERNIA REPAIR INGUINAL ADULT;  Surgeon: Harl Bowie, MD;  [facility];  Service: General;  Laterality: Left;  Open Left Inguinal Hernia Repair with Mesh   INGUINAL HERNIA REPAIR Left 10/19/2017   Procedure: LEFT INGUINAL HERNIA REPAIR WITH MESH;  Surgeon: Coralie Keens, MD;  Location: [facility];  Service: General;  Laterality: Left;  GENERAL AND TAP BLOCK   INSERTION OF MESH Left 10/19/2017   Procedure: INSERTION OF MESH LEFT INGUINAL HERNIA;  Surgeon: Coralie Keens, MD;  Location: [facility];  Service: General;  Laterality: Left;  GENERAL AND TAP BLOCK   JOINT REPLACEMENT     KNEE SURGERY     REPLACEMENT TOTAL KNEE  11/20/10   left   TONSILLECTOMY     UMBILICAL HERNIA REPAIR  10/06/2011   Procedure: HERNIA REPAIR UMBILICAL ADULT;  Surgeon: Harl Bowie, MD;  [facility];  Service: General;  Laterality: N/A;  Family History  Problem Relation Age of Onset   Heart disease Father     Social History   Social History Narrative   Not on file     Review of Systems General: Denies fevers, chills, weight loss CV: Denies chest pain, shortness of breath, palpitations  Physical Exam Vitals with BMI 08/05/2021 02/25/2020 02/25/2020  Height 5' 8.5" - -  Weight 198 lbs 3 oz - -  BMI 32.54 - -  Systolic 982 641 583  Diastolic 73 74 81  Pulse 69 64 62    General:  No acute distress,  Alert and oriented, Non-Toxic, Normal speech and affect Examination shows 2 separate areas on the scalp vertex.  I anticipate the defect to be 5 to 6 cm in greatest dimension.  No  obvious surrounding scars.  He has a full head of hair.  Assessment/Plan Patient presents with squamous cell carcinoma of the scalp.  He has a planned Mohs excision.  He seems interested in maintaining his hairbearing scalp so we will more than likely do adjacent tissue transfer to close this.  I drew out the anticipated incision patterns for him.  We discussed risk that include bleeding, infection, damage to surrounding structures need for additional procedures.  We will plan to be available to do this in the operating room shortly after his excision.  All of his questions were answered.  Cindra Presume 08/05/2021, 12:26 PM

## 2021-08-05 NOTE — H&P (View-Only) (Signed)
Referring Provider Leonard Downing, MD [address] [place],  [place] [address]   CC:  Chief Complaint  Patient presents with   Advice Only      Travis [name]. is an 87 y.o. male.  HPI: Patient presents to discuss reconstruction of a planned upcoming Mohs excision of squamous cell carcinoma of the scalp.  He has had numerous skin cancers in the past that have been treated with a variety of methods including topical treatments and excisions.  No previous radiation to the scalp.  Allergies  Allergen Reactions   Metformin And Related Other (See Comments)    Stomach ulcers    Outpatient Encounter Medications as of 08/05/2021  Medication Sig   amLODipine (NORVASC) 10 MG tablet Take 1 tablet by mouth daily.   clopidogrel (PLAVIX) 75 MG tablet Take 1 tablet by mouth  daily   finasteride (PROSCAR) 5 MG tablet Take 5 mg by mouth daily.   hydrochlorothiazide (HYDRODIURIL) 25 MG tablet Take 1 tablet (25 mg total) by mouth daily. Please have PCP authorize renewal maintenance prescription   ibuprofen (ADVIL,MOTRIN) 200 MG tablet Take 100 mg by mouth every 6 (six) hours as needed for headache or moderate pain.    lisinopril (ZESTRIL) 10 MG tablet TAKE 1 TABLET BY MOUTH  DAILY   metFORMIN (GLUCOPHAGE) 500 MG tablet Take 1 tablet (500 mg total) by mouth 2 (two) times daily with a meal. (Patient taking differently: Take 250 mg by mouth 2 (two) times daily with a meal.)   terazosin (HYTRIN) 10 MG capsule Take 1 capsule (10 mg total) by mouth at bedtime.   [DISCONTINUED] oxyCODONE (OXY IR/ROXICODONE) 5 MG immediate release tablet Take 1-2 tablets (5-10 mg total) by mouth every 6 (six) hours as needed for moderate pain, severe pain or breakthrough pain.   No facility-administered encounter medications on file as of 08/05/2021.     Past Medical History:  Diagnosis Date   Arthritis    Chronic kidney disease    stone 1990   Diabetes mellitus without complication ([place])    History of  kidney stones    Hypertension    PSA elevation    Recurrent inguinal hernia    Left   Stroke [facility])     Past Surgical History:  Procedure Laterality Date   EP IMPLANTABLE DEVICE N/A 07/10/2015   Procedure: Loop Recorder Insertion;  Surgeon: Will Meredith Leeds, MD;  Location: [facility];  Service: Cardiovascular;  Laterality: N/A;   EYE SURGERY Right    INGUINAL HERNIA REPAIR  10/06/2011   Procedure: HERNIA REPAIR INGUINAL ADULT;  Surgeon: Harl Bowie, MD;  [facility];  Service: General;  Laterality: Left;  Open Left Inguinal Hernia Repair with Mesh   INGUINAL HERNIA REPAIR Left 10/19/2017   Procedure: LEFT INGUINAL HERNIA REPAIR WITH MESH;  Surgeon: Coralie Keens, MD;  Location: [facility];  Service: General;  Laterality: Left;  GENERAL AND TAP BLOCK   INSERTION OF MESH Left 10/19/2017   Procedure: INSERTION OF MESH LEFT INGUINAL HERNIA;  Surgeon: Coralie Keens, MD;  Location: [facility];  Service: General;  Laterality: Left;  GENERAL AND TAP BLOCK   JOINT REPLACEMENT     KNEE SURGERY     REPLACEMENT TOTAL KNEE  11/20/10   left   TONSILLECTOMY     UMBILICAL HERNIA REPAIR  10/06/2011   Procedure: HERNIA REPAIR UMBILICAL ADULT;  Surgeon: Harl Bowie, MD;  [facility];  Service: General;  Laterality: N/A;  Family History  Problem Relation Age of Onset   Heart disease Father     Social History   Social History Narrative   Not on file     Review of Systems General: Denies fevers, chills, weight loss CV: Denies chest pain, shortness of breath, palpitations  Physical Exam Vitals with BMI 08/05/2021 02/25/2020 02/25/2020  Height 5' 8.5" - -  Weight 198 lbs 3 oz - -  BMI 93.73 - -  Systolic 428 768 115  Diastolic 73 74 81  Pulse 69 64 62    General:  No acute distress,  Alert and oriented, Non-Toxic, Normal speech and affect Examination shows 2 separate areas on the scalp vertex.  I anticipate the defect to be 5 to 6 cm in greatest dimension.  No  obvious surrounding scars.  He has a full head of hair.  Assessment/Plan Patient presents with squamous cell carcinoma of the scalp.  He has a planned Mohs excision.  He seems interested in maintaining his hairbearing scalp so we will more than likely do adjacent tissue transfer to close this.  I drew out the anticipated incision patterns for him.  We discussed risk that include bleeding, infection, damage to surrounding structures need for additional procedures.  We will plan to be available to do this in the operating room shortly after his excision.  All of his questions were answered.  Cindra Presume 08/05/2021, 12:26 PM

## 2021-08-18 NOTE — Progress Notes (Signed)
[employer] representatives was notified via email. Physician's order form was filled out and faxed to [facility]

## 2021-08-18 NOTE — Progress Notes (Signed)
Surgical Instructions    Your procedure is scheduled on Tuesday, November 15th, 2022.   Report to [facility] Main Entrance "A" at 10:30 A.M., then check in with the Admitting office.  Call this number if you have problems the morning of surgery:  [phone]   If you have any questions prior to your surgery date call [phone]: Open Monday-Friday 8am-4pm    Remember:  Do not eat after midnight the night before your surgery  You may drink clear liquids until 09:30 the morning of your surgery.   Clear liquids allowed are: Water, Non-Citrus Juices (without pulp), Carbonated Beverages, Clear Tea, Black Coffee ONLY (NO MILK, CREAM OR POWDERED CREAMER of any kind), and Gatorade    Take these medicines the morning of surgery with A SIP OF WATER:  finasteride (PROSCAR)    Follow your surgeon's instructions on when to stop Plavix.  If no instructions were given by your surgeon then you will need to call the office to get those instructions.     As of today, STOP taking any Aspirin (unless otherwise instructed by your surgeon) Aleve, Naproxen, Ibuprofen, Motrin, Advil, Goody's, BC's, all herbal medications, fish oil, and all vitamins.    WHAT DO I DO ABOUT MY DIABETES MEDICATION?   Do not take metFORMIN (GLUCOPHAGE) the morning of surgery.   HOW TO MANAGE YOUR DIABETES BEFORE AND AFTER SURGERY  Why is it important to control my blood sugar before and after surgery? Improving blood sugar levels before and after surgery helps healing and can limit problems. A way of improving blood sugar control is eating a healthy diet by:  Eating less sugar and carbohydrates  Increasing activity/exercise  Talking with your doctor about reaching your blood sugar goals High blood sugars (greater than 180 mg/dL) can raise your risk of infections and slow your recovery, so you will need to focus on controlling your diabetes during the weeks before surgery. Make sure that the doctor who takes care of  your diabetes knows about your planned surgery including the date and location.  How do I manage my blood sugar before surgery? Check your blood sugar at least 4 times a day, starting 2 days before surgery, to make sure that the level is not too high or low.  Check your blood sugar the morning of your surgery when you wake up and every 2 hours until you get to the Short Stay unit.  If your blood sugar is less than 70 mg/dL, you will need to treat for low blood sugar: Do not take insulin. Treat a low blood sugar (less than 70 mg/dL) with  cup of clear juice (cranberry or apple), 4 glucose tablets, OR glucose gel. Recheck blood sugar in 15 minutes after treatment (to make sure it is greater than 70 mg/dL). If your blood sugar is not greater than 70 mg/dL on recheck, call [phone] for further instructions. Report your blood sugar to the short stay nurse when you get to Short Stay.  If you are admitted to the hospital after surgery: Your blood sugar will be checked by the staff and you will probably be given insulin after surgery (instead of oral diabetes medicines) to make sure you have good blood sugar levels. The goal for blood sugar control after surgery is 80-180 mg/dL.    After your COVID test   You are not required to quarantine however you are required to wear a well-fitting mask when you are out and around people not in your household.  If your mask becomes wet or soiled, replace with a new one.  Wash your hands often with soap and water for 20 seconds or clean your hands with an alcohol-based hand sanitizer that contains at least 60% alcohol.  Do not share personal items.  Notify your provider: if you are in close contact with someone who has COVID  or if you develop a fever of 100.4 or greater, sneezing, cough, sore throat, shortness of breath or body aches.    The day of surgery:          Do not wear jewelry  Do not wear lotions, powders, colognes, or deodorant. Men  may shave face and neck. Do not bring valuables to the hospital.              [facility] is not responsible for any belongings or valuables.  Do NOT Smoke (Tobacco/Vaping)  24 hours prior to your procedure  If you use a CPAP at night, you may bring your mask for your overnight stay.   Contacts, glasses, hearing aids, dentures or partials may not be worn into surgery, please bring cases for these belongings   For patients admitted to the hospital, discharge time will be determined by your treatment team.   Patients discharged the day of surgery will not be allowed to drive home, and someone needs to stay with them for 24 hours.  NO VISITORS WILL BE ALLOWED IN PRE-OP WHERE PATIENTS ARE PREPPED FOR SURGERY.  ONLY 1 SUPPORT PERSON MAY BE PRESENT IN THE WAITING ROOM WHILE YOU ARE IN SURGERY.  IF YOU ARE TO BE ADMITTED, ONCE YOU ARE IN YOUR ROOM YOU WILL BE ALLOWED TWO (2) VISITORS. 1 (ONE) VISITOR MAY STAY OVERNIGHT BUT MUST ARRIVE TO THE ROOM BY 8pm.  Minor children may have two parents present. Special consideration for safety and communication needs will be reviewed on a case by case basis.  Special instructions:    Oral Hygiene is also important to reduce your risk of infection.  Remember - BRUSH YOUR TEETH THE MORNING OF SURGERY WITH YOUR REGULAR TOOTHPASTE   [place]- Preparing For Surgery  Before surgery, you can play an important role. Because skin is not sterile, your skin needs to be as free of germs as possible. You can reduce the number of germs on your skin by washing with CHG (chlorahexidine gluconate) Soap before surgery.  CHG is an antiseptic cleaner which kills germs and bonds with the skin to continue killing germs even after washing.     Please do not use if you have an allergy to CHG or antibacterial soaps. If your skin becomes reddened/irritated stop using the CHG.  Do not shave (including legs and underarms) for at least 48 hours prior to first CHG shower. It is OK  to shave your face.  Please follow these instructions carefully.     Shower the NIGHT BEFORE SURGERY and the MORNING OF SURGERY with CHG Soap.   If you chose to wash your hair, wash your hair first as usual with your normal shampoo. After you shampoo, rinse your hair and body thoroughly to remove the shampoo.  Then [employer] and genitals (private parts) with your normal soap and rinse thoroughly to remove soap.  After that Use CHG Soap as you would any other liquid soap. You can apply CHG directly to the skin and wash gently with a scrungie or a clean washcloth.   Apply the CHG Soap to your body ONLY FROM THE  NECK DOWN.  Do not use on open wounds or open sores. Avoid contact with your eyes, ears, mouth and genitals (private parts). Wash Face and genitals (private parts)  with your normal soap.   Wash thoroughly, paying special attention to the area where your surgery will be performed.  Thoroughly rinse your body with warm water from the neck down.  DO NOT shower/wash with your normal soap after using and rinsing off the CHG Soap.  Pat yourself dry with a CLEAN TOWEL.  Wear CLEAN PAJAMAS to bed the night before surgery  Place CLEAN SHEETS on your bed the night before your surgery  DO NOT SLEEP WITH PETS.   Day of Surgery:  Take a shower with CHG soap. Wear Clean/Comfortable clothing the morning of surgery Do not apply any deodorants/lotions.   Remember to brush your teeth WITH YOUR REGULAR TOOTHPASTE.   Please read over the following fact sheets that you were given.

## 2021-08-19 ENCOUNTER — Encounter (HOSPITAL_COMMUNITY): Payer: Self-pay

## 2021-08-19 ENCOUNTER — Encounter (HOSPITAL_COMMUNITY)
Admission: RE | Admit: 2021-08-19 | Discharge: 2021-08-19 | Disposition: A | Discharge status: Home / Self Care | Payer: Medicare Other | Source: Ambulatory Visit | Attending: Plastic Surgery | Admitting: Plastic Surgery

## 2021-08-19 ENCOUNTER — Other Ambulatory Visit: Payer: Self-pay

## 2021-08-19 VITALS — BP 147/80 | HR 74 | Temp 98.4°F | Resp 18

## 2021-08-19 DIAGNOSIS — Z8673 Personal history of transient ischemic attack (TIA), and cerebral infarction without residual deficits: Secondary | ICD-10-CM | POA: Diagnosis not present

## 2021-08-19 DIAGNOSIS — E119 Type 2 diabetes mellitus without complications: Secondary | ICD-10-CM | POA: Insufficient documentation

## 2021-08-19 DIAGNOSIS — I10 Essential (primary) hypertension: Secondary | ICD-10-CM | POA: Diagnosis not present

## 2021-08-19 DIAGNOSIS — Z01818 Encounter for other preprocedural examination: Secondary | ICD-10-CM | POA: Insufficient documentation

## 2021-08-19 DIAGNOSIS — C4442 Squamous cell carcinoma of skin of scalp and neck: Secondary | ICD-10-CM | POA: Diagnosis not present

## 2021-08-19 DIAGNOSIS — Z87891 Personal history of nicotine dependence: Secondary | ICD-10-CM | POA: Insufficient documentation

## 2021-08-19 DIAGNOSIS — Z7902 Long term (current) use of antithrombotics/antiplatelets: Secondary | ICD-10-CM | POA: Diagnosis not present

## 2021-08-19 HISTORY — DX: Unilateral inguinal hernia, without obstruction or gangrene, not specified as recurrent: K40.90

## 2021-08-19 HISTORY — DX: Cardiac arrhythmia, unspecified: I49.9

## 2021-08-19 LAB — BASIC METABOLIC PANEL
Anion gap: 7 (ref 5–15)
BUN: 20 mg/dL (ref 8–23)
CO2: 25 mmol/L (ref 22–32)
Calcium: 9.7 mg/dL (ref 8.9–10.3)
Chloride: 106 mmol/L (ref 98–111)
Creatinine, Ser: 1.31 mg/dL — ABNORMAL HIGH (ref 0.61–1.24)
GFR, Estimated: 52 mL/min — ABNORMAL LOW (ref >60)
Glucose, Bld: 109 mg/dL — ABNORMAL HIGH (ref 70–99)
Potassium: 4.6 mmol/L (ref 3.5–5.1)
Sodium: 138 mmol/L (ref 135–145)

## 2021-08-19 LAB — CBC
HCT: 38.3 % — ABNORMAL LOW (ref 39.0–52.0)
Hemoglobin: 12.4 g/dL — ABNORMAL LOW (ref 13.0–17.0)
MCH: 30.9 pg (ref 26.0–34.0)
MCHC: 32.4 g/dL (ref 30.0–36.0)
MCV: 95.5 fL (ref 80.0–100.0)
Platelets: 292 10*3/uL (ref 150–400)
RBC: 4.01 MIL/uL — ABNORMAL LOW (ref 4.22–5.81)
RDW: 13.1 % (ref 11.5–15.5)
WBC: 7.6 10*3/uL (ref 4.0–10.5)
nRBC: 0 % (ref 0.0–0.2)

## 2021-08-19 LAB — GLUCOSE, CAPILLARY: Glucose-Capillary: 129 mg/dL — ABNORMAL HIGH (ref 70–99)

## 2021-08-19 NOTE — Progress Notes (Addendum)
PCP - Dr. Claris Gower Cardiologist - Denies currently seeing anyone. Saw Travis [name] in the past, 2016, had a stroke and cardiac device was implanted at that time. Battery died after three years   Chest x-ray - Not indicated EKG - 08/19/21 Stress Test - Denies ECHO - 07/08/15 Cardiac Cath - Denies  Sleep Study - Denies  DM - Type II CBG at PAT appt 129 Checks Blood Sugar at [place] office  Blood Thinner Instructions: Dr. Claudia Desanctis instructed Travis [name] to stop Plavix already    Anesthesia review: Yes patient stated after stroke in 2016 that he had an implantable cardiac device that monitored Travis [name] for 3 years and that just before discontinuing or battery dying the patient was told his heart stopped by the device clinic.   Patient denies shortness of breath, fever, cough and chest pain at PAT appointment   All instructions explained to the patient, with a verbal understanding of the material. Patient agrees to go over the instructions while at home for a better understanding. The opportunity to ask questions was provided.  Called PCP office Dr. Welton Flakes and spoke to Anselmo who is going to fax over A1C results to US done on 08/10/21

## 2021-08-20 ENCOUNTER — Encounter (HOSPITAL_COMMUNITY): Payer: Self-pay

## 2021-08-20 NOTE — Anesthesia Preprocedure Evaluation (Addendum)
Anesthesia Evaluation  Patient identified by MRN, date of birth, ID band Patient awake    Reviewed: Allergy & Precautions, NPO status , Patient's Chart, lab work & pertinent test results  Airway Mallampati: III  TM Distance: >3 FB Neck ROM: Full    Dental  (+) Poor Dentition, Chipped, Missing   Pulmonary former smoker,    Pulmonary exam normal breath sounds clear to auscultation       Cardiovascular hypertension, Pt. on medications Normal cardiovascular exam+ dysrhythmias  Rhythm:Regular Rate:Normal  ECG: rate 67   Neuro/Psych CVA, Residual Symptoms negative psych ROS   GI/Hepatic negative GI ROS, Neg liver ROS,   Endo/Other  diabetes, Oral Hypoglycemic Agents  Renal/GU Renal disease     Musculoskeletal  (+) Arthritis ,   Abdominal   Peds  Hematology  (+) anemia ,   Anesthesia Other Findings Squamous Cell Carcinoma of Scalp  Reproductive/Obstetrics                           Anesthesia Physical Anesthesia Plan  ASA: 3  Anesthesia Plan: General   Post-op Pain Management:    Induction: Intravenous  PONV Risk Score and Plan: 2 and Ondansetron, Dexamethasone and Treatment may vary due to age or medical condition  Airway Management Planned: LMA  Additional Equipment:   Intra-op Plan:   Post-operative Plan: Extubation in OR  Informed Consent: I have reviewed the patients History and Physical, chart, labs and discussed the procedure including the risks, benefits and alternatives for the proposed anesthesia with the patient or authorized representative who has indicated his/her understanding and acceptance.     Dental advisory given  Plan Discussed with: CRNA  Anesthesia Plan Comments: (Reviewed PAT note written 08/20/2021 by Myra Gianotti, PA-C. )      Anesthesia Quick Evaluation

## 2021-08-20 NOTE — Progress Notes (Signed)
Anesthesia Chart Review:  Case: [id number] Date/Time: 09/01/21 1215   Procedures:      reconstruction of scalp defect with adjacent tissue transfer (Scalp) - 1.5 hour     possible application of integra (Scalp)     SKIN GRAFT SPLIT THICKNESS   Anesthesia type: General   Pre-op diagnosis: Squamous Cell Carcinoma of Scalp   Location: [facility] OR ROOM 07 / [place] [facility]   Surgeons: Cindra Presume, MD       DISCUSSION: Patient is an 87 year old male scheduled for the above procedure.  History includes former smoker (quit 10/03/62), HTN, DM2, CVA (07/07/15), dysrhythmia (loop recorder placed 07/10/15, no afib but with runs of nonsustained SVT ~ 07/2018, asymptomatic 4 second pause 07/03/18, he declined further therapy), Bifascicular block (RBBB, LAFB 09/29/17), cancer (SCC of scalp, neck; s/p Mohs procedure), hernia (s/p umbilical & left inguinal hernia repair 10/06/11; left IHR 10/19/17).  On Plavix for CVA 2016. He reported Plavix already on hold.    Patient with known bifascicular block on EKG for several years. He had asymptomatic non-sustained SVT and 4 second pause in 2019, but no specific therapy desired and as needed EP follow-up recommended. He denied chest pain and SOB at PAT RN visit. Last A1c 6.4% last month preoperative labs.   Reviewed case with anesthesiologist Tamela Gammon, MD. Anesthesia team to evaluate on the day of surgery.    VS: BP (!) 147/80   Pulse 74   Temp 36.9 C (Oral)   Resp 18   SpO2 98%   PROVIDERS: Leonard Downing, MD is PCP [phone]) Curt Bears, Will, MD is EP cardiologist (for loop recorder implant 07/10/15). Last visit 09/19/18. No further CVA symptoms and no afib on LINQ monitor. Transmissions in Fall 2019 had shown intermittent ST/SVT episodes, longest lasting 6 minutes 22 seconds and on 07/03/18 a 4 second pause. He was asymptomatic with these episodes. He did not wish any further therapy. No changes made. As needed EP follow-up.    LABS: Labs reviewed: Acceptable  for surgery. A1c 6.4% 08/10/21 (PCP) (all labs ordered are listed, but only abnormal results are displayed)  Labs Reviewed  GLUCOSE, CAPILLARY - Abnormal; Notable for the following components:      Result Value   Glucose-Capillary 129 (*)    All other components within normal limits  CBC - Abnormal; Notable for the following components:   RBC 4.01 (*)    Hemoglobin 12.4 (*)    HCT 38.3 (*)    All other components within normal limits  BASIC METABOLIC PANEL - Abnormal; Notable for the following components:   Glucose, Bld 109 (*)    Creatinine, Ser 1.31 (*)    GFR, Estimated 52 (*)    All other components within normal limits    EKG: Normal sinus rhythm Right bundle branch block Left anterior fascicular block ** Bifascicular block ** Abnormal ECG Confirmed by Loralie Champagne [phone]) on 08/19/2021 11:48:42 PM - Known bifascicular block dating back to at least 09/29/17   CV: Carotid duplex 07/08/15:  Summary: - Findings suggest 1-39% internal carotid artery stenosis bilaterally. The left vertebral artery is patent with antegrade flow. Unable to visualize the right vertebral artery.   Echo 07/08/15:  Study Conclusions: - Left ventricle: The cavity size was normal. Systolic function was normal. The estimated ejection fraction was in the range of 55% to 60%. Wall motion was normal; there were no regional wall motion abnormalities. - Aortic valve: Trileaflet; mildly thickened, mildly calcified leaflets. There was mild regurgitation. -  Aorta: Aortic root dimension: 40 mm (ED). Ascending aorta diameter: 42 mm (ED). - Aortic root: The aortic root was mildly dilated (70mm). - Ascending aorta: The ascending aorta was mildly dilated (94mm).     Past Medical History:  Diagnosis Date   Arthritis    Cancer ([place]) 2022   scalp and neck   Chronic kidney disease    stone 1990   Diabetes mellitus without complication ([place])    History of kidney stones    Hypertension    Inguinal hernia     left   Pneumonia 1960   PSA elevation    Recurrent inguinal hernia    Left   Stroke [facility])     Past Surgical History:  Procedure Laterality Date   EP IMPLANTABLE DEVICE N/A 07/10/2015   Procedure: Loop Recorder Insertion;  Surgeon: Will Meredith Leeds, MD;  Location: [place] [facility];  Service: Cardiovascular;  Laterality: N/A;   EYE SURGERY Right    INGUINAL HERNIA REPAIR  10/06/2011   Procedure: HERNIA REPAIR INGUINAL ADULT;  Surgeon: Harl Bowie, MD;  Location: [facility];  Service: General;  Laterality: Left;  Open Left Inguinal Hernia Repair with Mesh   INGUINAL HERNIA REPAIR Left 10/19/2017   Procedure: LEFT INGUINAL HERNIA REPAIR WITH MESH;  Surgeon: Coralie Keens, MD;  Location: [place];  Service: General;  Laterality: Left;  GENERAL AND TAP BLOCK   INSERTION OF MESH Left 10/19/2017   Procedure: INSERTION OF MESH LEFT INGUINAL HERNIA;  Surgeon: Coralie Keens, MD;  Location: [place];  Service: General;  Laterality: Left;  GENERAL AND TAP BLOCK   JOINT REPLACEMENT     KNEE SURGERY     REPLACEMENT TOTAL KNEE  11/20/10   left   TONSILLECTOMY     UMBILICAL HERNIA REPAIR  10/06/2011   Procedure: HERNIA REPAIR UMBILICAL ADULT;  Surgeon: Harl Bowie, MD;  Location: [facility];  Service: General;  Laterality: N/A;    MEDICATIONS:  amLODipine (NORVASC) 10 MG tablet   clopidogrel (PLAVIX) 75 MG tablet   finasteride (PROSCAR) 5 MG tablet   fluorouracil (EFUDEX) 5 % cream   hydrochlorothiazide (HYDRODIURIL) 25 MG tablet   ibuprofen (ADVIL,MOTRIN) 200 MG tablet   ketoconazole (NIZORAL) 2 % cream   lisinopril (ZESTRIL) 40 MG tablet   metFORMIN (GLUCOPHAGE) 500 MG tablet   terazosin (HYTRIN) 10 MG capsule   No current facility-administered medications for this encounter.    Myra Gianotti, PA-C Surgical Short Stay/Anesthesiology [facility] Phone [phone] [facility] Phone [phone] 08/20/2021 6:07 PM

## 2021-09-01 ENCOUNTER — Ambulatory Visit (HOSPITAL_COMMUNITY): Payer: Medicare Other | Admitting: Vascular Surgery

## 2021-09-01 ENCOUNTER — Encounter (HOSPITAL_COMMUNITY): Payer: Self-pay | Admitting: Plastic Surgery

## 2021-09-01 ENCOUNTER — Telehealth: Payer: Self-pay | Admitting: Physician Assistant

## 2021-09-01 ENCOUNTER — Ambulatory Visit (HOSPITAL_COMMUNITY): Payer: Medicare Other | Admitting: Certified Registered Nurse Anesthetist

## 2021-09-01 ENCOUNTER — Encounter (HOSPITAL_COMMUNITY)
Admission: RE | Disposition: A | Discharge status: Home / Self Care | Payer: Self-pay | Source: Home / Self Care | Attending: Plastic Surgery

## 2021-09-01 ENCOUNTER — Ambulatory Visit (HOSPITAL_COMMUNITY)
Admission: RE | Admit: 2021-09-01 | Discharge: 2021-09-01 | Disposition: A | Discharge status: Home / Self Care | Payer: Medicare Other | Attending: Plastic Surgery | Admitting: Plastic Surgery

## 2021-09-01 DIAGNOSIS — D649 Anemia, unspecified: Secondary | ICD-10-CM | POA: Insufficient documentation

## 2021-09-01 DIAGNOSIS — I10 Essential (primary) hypertension: Secondary | ICD-10-CM | POA: Insufficient documentation

## 2021-09-01 DIAGNOSIS — Z87891 Personal history of nicotine dependence: Secondary | ICD-10-CM | POA: Insufficient documentation

## 2021-09-01 DIAGNOSIS — I6930 Unspecified sequelae of cerebral infarction: Secondary | ICD-10-CM | POA: Diagnosis not present

## 2021-09-01 DIAGNOSIS — C4442 Squamous cell carcinoma of skin of scalp and neck: Secondary | ICD-10-CM

## 2021-09-01 DIAGNOSIS — E119 Type 2 diabetes mellitus without complications: Secondary | ICD-10-CM | POA: Insufficient documentation

## 2021-09-01 DIAGNOSIS — Z428 Encounter for other plastic and reconstructive surgery following medical procedure or healed injury: Secondary | ICD-10-CM | POA: Diagnosis not present

## 2021-09-01 DIAGNOSIS — Z85828 Personal history of other malignant neoplasm of skin: Secondary | ICD-10-CM | POA: Insufficient documentation

## 2021-09-01 DIAGNOSIS — Z7984 Long term (current) use of oral hypoglycemic drugs: Secondary | ICD-10-CM | POA: Insufficient documentation

## 2021-09-01 DIAGNOSIS — M1990 Unspecified osteoarthritis, unspecified site: Secondary | ICD-10-CM | POA: Diagnosis not present

## 2021-09-01 HISTORY — PX: ADJACENT TISSUE TRANSFER/TISSUE REARRANGEMENT: SHX6829

## 2021-09-01 LAB — GLUCOSE, CAPILLARY
Glucose-Capillary: 125 mg/dL — ABNORMAL HIGH (ref 70–99)
Glucose-Capillary: 137 mg/dL — ABNORMAL HIGH (ref 70–99)

## 2021-09-01 SURGERY — ADJACENT TISSUE TRANSFER
Anesthesia: General | Site: Scalp

## 2021-09-01 MED ORDER — ONDANSETRON HCL 4 MG/2ML IJ SOLN
INTRAMUSCULAR | Status: DC | PRN
  Administered 2021-09-01: 4 mg via INTRAVENOUS

## 2021-09-01 MED ORDER — LIDOCAINE-EPINEPHRINE 1 %-1:100000 IJ SOLN
INTRAMUSCULAR | Status: AC
  Filled 2021-09-01: qty 1

## 2021-09-01 MED ORDER — ACETAMINOPHEN 10 MG/ML IV SOLN
INTRAVENOUS | Status: AC
  Filled 2021-09-01: qty 100

## 2021-09-01 MED ORDER — CHLORHEXIDINE GLUCONATE CLOTH 2 % EX PADS: 6.0000 | MEDICATED_PAD | Freq: Once | CUTANEOUS | Status: DC

## 2021-09-01 MED ORDER — EPHEDRINE SULFATE-NACL 50-0.9 MG/10ML-% IV SOSY
PREFILLED_SYRINGE | INTRAVENOUS | Status: DC | PRN
  Administered 2021-09-01: 2.5 mg via INTRAVENOUS

## 2021-09-01 MED ORDER — CEFAZOLIN SODIUM-DEXTROSE 2-4 GM/100ML-% IV SOLN
2.0000 g | INTRAVENOUS | Status: AC
  Administered 2021-09-01: 2 g via INTRAVENOUS
  Filled 2021-09-01: qty 100

## 2021-09-01 MED ORDER — DEXAMETHASONE SODIUM PHOSPHATE 10 MG/ML IJ SOLN
INTRAMUSCULAR | Status: AC
  Filled 2021-09-01: qty 1

## 2021-09-01 MED ORDER — ONDANSETRON HCL 4 MG/2ML IJ SOLN: 4.0000 mg | Freq: Once | INTRAMUSCULAR | Status: DC | PRN

## 2021-09-01 MED ORDER — BUPIVACAINE HCL (PF) 0.25 % IJ SOLN
INTRAMUSCULAR | Status: AC
  Filled 2021-09-01: qty 30

## 2021-09-01 MED ORDER — CHLORHEXIDINE GLUCONATE 0.12 % MT SOLN
15.0000 mL | Freq: Once | OROMUCOSAL | Status: AC
  Administered 2021-09-01: 15 mL via OROMUCOSAL
  Filled 2021-09-01: qty 15

## 2021-09-01 MED ORDER — GLYCOPYRROLATE PF 0.2 MG/ML IJ SOSY
PREFILLED_SYRINGE | INTRAMUSCULAR | Status: AC
  Filled 2021-09-01: qty 1

## 2021-09-01 MED ORDER — FENTANYL CITRATE (PF) 100 MCG/2ML IJ SOLN
25.0000 ug | INTRAMUSCULAR | Status: DC | PRN
  Administered 2021-09-01 (×3): 50 ug via INTRAVENOUS

## 2021-09-01 MED ORDER — DEXAMETHASONE SODIUM PHOSPHATE 10 MG/ML IJ SOLN
INTRAMUSCULAR | Status: DC | PRN
  Administered 2021-09-01: 5 mg via INTRAVENOUS

## 2021-09-01 MED ORDER — BACITRACIN ZINC 500 UNIT/GM EX OINT
TOPICAL_OINTMENT | CUTANEOUS | Status: AC
  Filled 2021-09-01: qty 28.35

## 2021-09-01 MED ORDER — ESMOLOL HCL 100 MG/10ML IV SOLN
INTRAVENOUS | Status: AC
  Filled 2021-09-01: qty 10

## 2021-09-01 MED ORDER — BACITRACIN ZINC 500 UNIT/GM EX OINT
TOPICAL_OINTMENT | CUTANEOUS | Status: DC | PRN
  Administered 2021-09-01: 1 via TOPICAL

## 2021-09-01 MED ORDER — FENTANYL CITRATE (PF) 100 MCG/2ML IJ SOLN
INTRAMUSCULAR | Status: AC
  Filled 2021-09-01: qty 2

## 2021-09-01 MED ORDER — ATROPINE SULFATE 0.4 MG/ML IV SOLN
INTRAVENOUS | Status: AC
  Filled 2021-09-01: qty 1

## 2021-09-01 MED ORDER — EPINEPHRINE PF 1 MG/ML IJ SOLN
INTRAMUSCULAR | Status: AC
  Filled 2021-09-01: qty 1

## 2021-09-01 MED ORDER — PHENYLEPHRINE HCL-NACL 20-0.9 MG/250ML-% IV SOLN
INTRAVENOUS | Status: DC | PRN
  Administered 2021-09-01: 20 ug/min via INTRAVENOUS

## 2021-09-01 MED ORDER — ORAL CARE MOUTH RINSE: 15.0000 mL | Freq: Once | OROMUCOSAL | Status: AC

## 2021-09-01 MED ORDER — ESMOLOL HCL 100 MG/10ML IV SOLN
INTRAVENOUS | Status: DC | PRN
  Administered 2021-09-01 (×2): 20 mg via INTRAVENOUS

## 2021-09-01 MED ORDER — EPHEDRINE 5 MG/ML INJ
INTRAVENOUS | Status: AC
  Filled 2021-09-01: qty 5

## 2021-09-01 MED ORDER — FENTANYL CITRATE (PF) 250 MCG/5ML IJ SOLN
INTRAMUSCULAR | Status: AC
  Filled 2021-09-01: qty 5

## 2021-09-01 MED ORDER — PROPOFOL 10 MG/ML IV BOLUS
INTRAVENOUS | Status: AC
  Filled 2021-09-01: qty 20

## 2021-09-01 MED ORDER — PROPOFOL 10 MG/ML IV BOLUS
INTRAVENOUS | Status: DC | PRN
  Administered 2021-09-01: 100 mg via INTRAVENOUS

## 2021-09-01 MED ORDER — LIDOCAINE 2% (20 MG/ML) 5 ML SYRINGE
INTRAMUSCULAR | Status: DC | PRN
  Administered 2021-09-01: 60 mg via INTRAVENOUS

## 2021-09-01 MED ORDER — LACTATED RINGERS IV SOLN
INTRAVENOUS | Status: DC | PRN
  Administered 2021-09-01: 200 mL

## 2021-09-01 MED ORDER — 0.9 % SODIUM CHLORIDE (POUR BTL) OPTIME
TOPICAL | Status: DC | PRN
  Administered 2021-09-01: 1000 mL

## 2021-09-01 MED ORDER — ONDANSETRON HCL 4 MG/2ML IJ SOLN
INTRAMUSCULAR | Status: AC
  Filled 2021-09-01: qty 2

## 2021-09-01 MED ORDER — PHENYLEPHRINE 40 MCG/ML (10ML) SYRINGE FOR IV PUSH (FOR BLOOD PRESSURE SUPPORT)
PREFILLED_SYRINGE | INTRAVENOUS | Status: AC
  Filled 2021-09-01: qty 20

## 2021-09-01 MED ORDER — FENTANYL CITRATE (PF) 250 MCG/5ML IJ SOLN
INTRAMUSCULAR | Status: DC | PRN
  Administered 2021-09-01: 50 ug via INTRAVENOUS
  Administered 2021-09-01: 100 ug via INTRAVENOUS

## 2021-09-01 MED ORDER — HYDROCODONE-ACETAMINOPHEN 5-325 MG PO TABS: 1.0000 | ORAL_TABLET | Freq: Four times a day (QID) | ORAL | 0 refills | Status: AC | PRN

## 2021-09-01 MED ORDER — ROCURONIUM BROMIDE 10 MG/ML (PF) SYRINGE
PREFILLED_SYRINGE | INTRAVENOUS | Status: AC
  Filled 2021-09-01: qty 10

## 2021-09-01 MED ORDER — ONDANSETRON HCL 4 MG PO TABS: 4.0000 mg | ORAL_TABLET | Freq: Three times a day (TID) | ORAL | 0 refills | Status: DC | PRN

## 2021-09-01 MED ORDER — ACETAMINOPHEN 10 MG/ML IV SOLN
1000.0000 mg | Freq: Once | INTRAVENOUS | Status: DC | PRN
  Administered 2021-09-01: 1000 mg via INTRAVENOUS

## 2021-09-01 MED ORDER — LIDOCAINE 2% (20 MG/ML) 5 ML SYRINGE
INTRAMUSCULAR | Status: AC
  Filled 2021-09-01: qty 5

## 2021-09-01 MED ORDER — BUPIVACAINE-EPINEPHRINE (PF) 0.25% -1:200000 IJ SOLN
INTRAMUSCULAR | Status: AC
  Filled 2021-09-01: qty 30

## 2021-09-01 MED ORDER — LACTATED RINGERS IV SOLN: INTRAVENOUS | Status: DC

## 2021-09-01 MED ORDER — AMISULPRIDE (ANTIEMETIC) 5 MG/2ML IV SOLN: 10.0000 mg | Freq: Once | INTRAVENOUS | Status: DC | PRN

## 2021-09-01 MED ORDER — ROCURONIUM BROMIDE 10 MG/ML (PF) SYRINGE
PREFILLED_SYRINGE | INTRAVENOUS | Status: DC | PRN
  Administered 2021-09-01: 40 mg via INTRAVENOUS
  Administered 2021-09-01: 20 mg via INTRAVENOUS

## 2021-09-01 MED ORDER — SUGAMMADEX SODIUM 200 MG/2ML IV SOLN
INTRAVENOUS | Status: DC | PRN
  Administered 2021-09-01 (×2): 100 mg via INTRAVENOUS

## 2021-09-01 SURGICAL SUPPLY — 108 items
ADH SKN CLS APL DERMABOND .7 (GAUZE/BANDAGES/DRESSINGS)
APL PRP STRL LF DISP 70% ISPRP (MISCELLANEOUS)
BAG COUNTER SPONGE SURGICOUNT (BAG) IMPLANT
BAG DECANTER FOR FLEXI CONT (MISCELLANEOUS) ×8 IMPLANT
BAG SPNG CNTER NS LX DISP (BAG)
BAG SURGICOUNT SPONGE COUNTING (BAG)
BLADE CLIPPER SURG (BLADE) IMPLANT
BLADE DERMATOME SS (BLADE) ×4 IMPLANT
BLADE SURG 15 STRL LF DISP TIS (BLADE) ×1 IMPLANT
BLADE SURG 15 STRL SS (BLADE)
BNDG COHESIVE 4X5 TAN STRL (GAUZE/BANDAGES/DRESSINGS) IMPLANT
BNDG ELASTIC 4X5.8 VLCR STR LF (GAUZE/BANDAGES/DRESSINGS) IMPLANT
BNDG ELASTIC 6X5.8 VLCR STR LF (GAUZE/BANDAGES/DRESSINGS) IMPLANT
BNDG GAUZE ELAST 4 BULKY (GAUZE/BANDAGES/DRESSINGS) ×3 IMPLANT
BRUSH SCRUB EZ PLAIN DRY (MISCELLANEOUS) ×8 IMPLANT
BUR ROUND FLUTED 5 RND (BURR) IMPLANT
BUR ROUND FLUTED 5MM RND (BURR)
CANISTER SUCT 1200ML W/VALVE (MISCELLANEOUS) ×4 IMPLANT
CANISTER SUCT 3000ML PPV (MISCELLANEOUS) IMPLANT
CANISTER WOUND CARE 500ML ATS (WOUND CARE) IMPLANT
CHLORAPREP W/TINT 26 (MISCELLANEOUS) ×1 IMPLANT
CLOSURE WOUND 1/2 X4 (GAUZE/BANDAGES/DRESSINGS)
COVER BACK TABLE 60X90IN (DRAPES) ×1 IMPLANT
COVER MAYO STAND STRL (DRAPES) ×4 IMPLANT
COVER SURGICAL LIGHT HANDLE (MISCELLANEOUS) ×4 IMPLANT
DERMABOND ADVANCED (GAUZE/BANDAGES/DRESSINGS)
DERMABOND ADVANCED .7 DNX12 (GAUZE/BANDAGES/DRESSINGS) IMPLANT
DERMACARRIERS GRAFT 1 TO 1.5 (DISPOSABLE)
DRAIN CHANNEL 15F RND FF W/TCR (WOUND CARE) IMPLANT
DRAPE EXTREMITY T 121X128X90 (DISPOSABLE) IMPLANT
DRAPE HALF SHEET 40X57 (DRAPES) ×4 IMPLANT
DRAPE INCISE IOBAN 66X45 STRL (DRAPES) ×1 IMPLANT
DRAPE ORTHO SPLIT 77X108 STRL (DRAPES) ×8
DRAPE SURG ORHT 6 SPLT 77X108 (DRAPES) ×4 IMPLANT
DRAPE U-SHAPE 76X120 STRL (DRAPES) IMPLANT
DRSG CALCIUM ALGINATE 4X4 (GAUZE/BANDAGES/DRESSINGS) ×1 IMPLANT
DRSG MEPITEL 4X7.2 (GAUZE/BANDAGES/DRESSINGS) IMPLANT
DRSG OPSITE 6X11 MED (GAUZE/BANDAGES/DRESSINGS) IMPLANT
DRSG PAD ABDOMINAL 8X10 ST (GAUZE/BANDAGES/DRESSINGS) ×4 IMPLANT
DRSG VAC ATS LRG SENSATRAC (GAUZE/BANDAGES/DRESSINGS) IMPLANT
DRSG VAC ATS MED SENSATRAC (GAUZE/BANDAGES/DRESSINGS) IMPLANT
DRSG VAC ATS SM SENSATRAC (GAUZE/BANDAGES/DRESSINGS) IMPLANT
ELECT CAUTERY BLADE 6.4 (BLADE) IMPLANT
ELECT COATED BLADE 2.86 ST (ELECTRODE) IMPLANT
ELECT NDL BLADE 2-5/6 (NEEDLE) IMPLANT
ELECT NEEDLE BLADE 2-5/6 (NEEDLE) IMPLANT
ELECT REM PT RETURN 9FT ADLT (ELECTROSURGICAL) ×4
ELECTRODE REM PT RTRN 9FT ADLT (ELECTROSURGICAL) ×2 IMPLANT
FILTER STRAW FLUID ASPIR (MISCELLANEOUS) ×4 IMPLANT
GAUZE SPONGE 4X4 12PLY STRL (GAUZE/BANDAGES/DRESSINGS) ×8 IMPLANT
GAUZE SPONGE 4X4 12PLY STRL LF (GAUZE/BANDAGES/DRESSINGS) ×3 IMPLANT
GAUZE XEROFORM 1X8 LF (GAUZE/BANDAGES/DRESSINGS) IMPLANT
GAUZE XEROFORM 5X9 LF (GAUZE/BANDAGES/DRESSINGS) IMPLANT
GLOVE SURG ENC TEXT LTX SZ7.5 (GLOVE) ×8 IMPLANT
GLOVE SURG UNDER LTX SZ8 (GLOVE) ×8 IMPLANT
GOWN STRL REUS W/ TWL LRG LVL3 (GOWN DISPOSABLE) ×4 IMPLANT
GOWN STRL REUS W/TWL LRG LVL3 (GOWN DISPOSABLE) ×8
GRAFT DERMACARRIERS 1 TO 1.5 (DISPOSABLE) ×1 IMPLANT
HANDPIECE INTERPULSE COAX TIP (DISPOSABLE)
HEMOSTAT SURGICEL 2X14 (HEMOSTASIS) ×1 IMPLANT
IV NS 1000ML (IV SOLUTION) ×4
IV NS 1000ML BAXH (IV SOLUTION) ×2 IMPLANT
KIT BASIN OR (CUSTOM PROCEDURE TRAY) ×4 IMPLANT
KIT TURNOVER KIT B (KITS) ×4 IMPLANT
MANIFOLD NEPTUNE II (INSTRUMENTS) ×4 IMPLANT
NDL HYPO 25GX1X1/2 BEV (NEEDLE) IMPLANT
NDL SPNL 18GX3.5 QUINCKE PK (NEEDLE) ×2 IMPLANT
NEEDLE 22X1 1/2 (OR ONLY) (NEEDLE) ×3 IMPLANT
NEEDLE HYPO 22GX1.5 SAFETY (NEEDLE) ×4 IMPLANT
NEEDLE HYPO 25GX1X1/2 BEV (NEEDLE) ×4 IMPLANT
NEEDLE SPNL 18GX3.5 QUINCKE PK (NEEDLE) ×8 IMPLANT
NS IRRIG 1000ML POUR BTL (IV SOLUTION) ×4 IMPLANT
PACK GENERAL/GYN (CUSTOM PROCEDURE TRAY) ×4 IMPLANT
PAD ABD 8X10 STRL (GAUZE/BANDAGES/DRESSINGS) ×3 IMPLANT
PAD ARMBOARD 7.5X6 YLW CONV (MISCELLANEOUS) ×8 IMPLANT
PENCIL SMOKE EVACUATOR (MISCELLANEOUS) ×4 IMPLANT
PIN SAFETY STERILE (MISCELLANEOUS) IMPLANT
SET HNDPC FAN SPRY TIP SCT (DISPOSABLE) IMPLANT
SHEET MEDIUM DRAPE 40X70 STRL (DRAPES) IMPLANT
SPONGE T-LAP 18X18 ~~LOC~~+RFID (SPONGE) ×9 IMPLANT
STAPLER VISISTAT 35W (STAPLE) ×7 IMPLANT
STOCKINETTE 6  STRL (DRAPES)
STOCKINETTE 6 STRL (DRAPES) IMPLANT
STOCKINETTE TUBULAR 6 INCH (GAUZE/BANDAGES/DRESSINGS) ×3 IMPLANT
STRIP CLOSURE SKIN 1/2X4 (GAUZE/BANDAGES/DRESSINGS) IMPLANT
SUT CHROMIC 4 0 PS 2 18 (SUTURE) IMPLANT
SUT ETHILON 2 0 FS 18 (SUTURE) IMPLANT
SUT MNCRL AB 3-0 PS2 18 (SUTURE) IMPLANT
SUT MNCRL AB 4-0 PS2 18 (SUTURE) ×1 IMPLANT
SUT MON AB 4-0 PC3 18 (SUTURE) IMPLANT
SUT MON AB 5-0 P3 18 (SUTURE) IMPLANT
SUT MON AB 5-0 PS2 18 (SUTURE) ×1 IMPLANT
SUT PDS AB 2-0 CT2 27 (SUTURE) IMPLANT
SUT PDS AB 3-0 SH 27 (SUTURE) ×13 IMPLANT
SUT PLAIN 5 0 P 3 18 (SUTURE) ×1 IMPLANT
SUT SILK 2 0 SH (SUTURE) IMPLANT
SUT VIC AB 3-0 PS1 18 (SUTURE)
SUT VIC AB 3-0 PS1 18XBRD (SUTURE) IMPLANT
SUT VICRYL 4-0 PS2 18IN ABS (SUTURE) IMPLANT
SYR 50ML LL SCALE MARK (SYRINGE) ×8 IMPLANT
SYR CONTROL 10ML LL (SYRINGE) ×1 IMPLANT
TOWEL GREEN STERILE (TOWEL DISPOSABLE) ×4 IMPLANT
TOWEL GREEN STERILE FF (TOWEL DISPOSABLE) ×4 IMPLANT
TRAY ENT MC OR (CUSTOM PROCEDURE TRAY) ×4 IMPLANT
TUBE CONNECTING 20'X1/4 (TUBING) ×1
TUBE CONNECTING 20X1/4 (TUBING) ×3 IMPLANT
UNDERPAD 30X36 HEAVY ABSORB (UNDERPADS AND DIAPERS) ×4 IMPLANT
YANKAUER SUCT BULB TIP NO VENT (SUCTIONS) ×3 IMPLANT

## 2021-09-01 NOTE — Discharge Instructions (Addendum)
Activity As tolerated Do not get your scalp wet until after your 1 week appointment. You can shower, but avoid getting this area wet. NO driving No heavy activities  Diet: Regular  Pain: Post op pain medications were sent to your pharmacy [place]. [facility]. The prescription is for Norco (Hydrocodone and Tylenol) 5mg . Take this every 6 hours as needed. You can take 500mg  of tylenol every 6-8 hours in addition to the Norco.  Nausea medication (Zofran) was also sent to your pharmacy  Wound Care: Keep dressing clean & dry. You can change dressing on scalp as needed, but leave in place for first 3 days without removing unless they become soiled.  SLEEP IN A RECLINER OR RECLINED POSITION. If you do not have a recliner or ability to recline your bed, you can prop yourself up with pillows. This is important for swelling/pain.  Call Doctor if any unusual problems occur such as pain, excessive Bleeding, unrelieved Nausea/vomiting, Fever &/or chills Follow-up appointment: Scheduled for next week.

## 2021-09-01 NOTE — Op Note (Signed)
Operative Note   DATE OF OPERATION: 09/01/2021  SURGICAL DEPARTMENT: Plastic Surgery  PREOPERATIVE DIAGNOSES: Scalp Mohs surgery defect  POSTOPERATIVE DIAGNOSES:  same  PROCEDURE: 1.  Debridement of scalp Mohs surgery defect totaling 9 x 6 cm 2.  Closure of scalp Mohs surgery defect with adjacent tissue transfer totaling 21 x 20 cm including the primary and secondary defect  SURGEON: Talmadge Coventry, MD  ASSISTANT: Verdie Shire, PA The advanced practice practitioner (APP) assisted throughout the case.  The APP was essential in retraction and counter traction when needed to make the case progress smoothly.  This retraction and assistance made it possible to see the tissue planes for the procedure.  The assistance was needed for hemostasis, tissue re-approximation and closure of the incision site.   ANESTHESIA:  General.   COMPLICATIONS: None.   INDICATIONS FOR PROCEDURE:  The patient, Travis [name] is a 87 y.o. male born on 06/24/1930, is here for treatment of scalp defect MRN: [id number]  CONSENT:  Informed consent was obtained directly from the patient. Risks, benefits and alternatives were fully discussed. Specific risks including but not limited to bleeding, infection, hematoma, seroma, scarring, pain, contracture, asymmetry, wound healing problems, and need for further surgery were all discussed. The patient did have an ample opportunity to have questions answered to satisfaction.   DESCRIPTION OF PROCEDURE:  The patient was taken to the operating room. SCDs were placed and antibiotics were given.  General anesthesia was administered.  The patient's operative site was prepped and draped in a sterile fashion. A time out was performed and all information was confirmed to be correct.  Started by examining the defect.  This was a 9 x 6 cm defect.  The central 3 cm circular area was down to pericranium.  The wound was debrided with pickups and a knife to remove any nonviable tissue.   It was then irrigated with saline.  The surrounding areas were more superficial.  I drew out a planned double rotation advancement scalp flap reconstruction.  Tumescent solution was infiltrated throughout and given time to work.  I then incised along the planned incisions with a 15 blade.  Flaps were elevated with cautery in the supra pericranial plane.  Wide undermining was performed.  Flaps were then advanced and closed to each other using combination of 3-0 PDS mattress sutures and staples.  This came together under moderate tension.  Soft dressing was applied.  The patient tolerated the procedure well.  There were no complications. The patient was allowed to wake from anesthesia, extubated and taken to the recovery room in satisfactory condition.

## 2021-09-01 NOTE — Anesthesia Postprocedure Evaluation (Signed)
Anesthesia Post Note  Patient: Travis [name].  Procedure(s) Performed: reconstruction of scalp defect with adjacent tissue transfer (Scalp)     Patient location during evaluation: PACU Anesthesia Type: General Level of consciousness: awake Pain management: pain level controlled Vital Signs Assessment: post-procedure vital signs reviewed and stable Respiratory status: spontaneous breathing, nonlabored ventilation, respiratory function stable and patient connected to nasal cannula oxygen Cardiovascular status: blood pressure returned to baseline and stable Postop Assessment: no apparent nausea or vomiting Anesthetic complications: no   No notable events documented.  Last Vitals:  Vitals:   09/01/21 1535 09/01/21 1550  BP: (!) 156/88 (!) 149/78  Pulse: 84 88  Resp: 18 15  Temp:  36.7 C  SpO2: 96% 92%    Last Pain:  Vitals:   09/01/21 1550  TempSrc:   PainSc: Asleep                 Valinda Fedie P Le [place]

## 2021-09-01 NOTE — Interval H&P Note (Signed)
Patient seen and examined. Risk and benefits discussed. Proceed with surgery.

## 2021-09-01 NOTE — Anesthesia Procedure Notes (Signed)
Procedure Name: Intubation Date/Time: 09/01/2021 12:42 PM Performed by: Janene Harvey, CRNA Pre-anesthesia Checklist: Patient identified, Emergency Drugs available, Suction available and Patient being monitored Patient Re-evaluated:Patient Re-evaluated prior to induction Oxygen Delivery Method: Circle system utilized Preoxygenation: Pre-oxygenation with 100% oxygen Induction Type: IV induction Ventilation: Mask ventilation without difficulty Laryngoscope Size: Mac and 4 Grade View: Grade III Tube type: Oral Tube size: 7.5 mm Number of attempts: 1 Airway Equipment and Method: Stylet and Oral airway Placement Confirmation: ETT inserted through vocal cords under direct vision, positive ETCO2 and breath sounds checked- equal and bilateral Secured at: 23 cm Tube secured with: Tape Dental Injury: Teeth and Oropharynx as per pre-operative assessment

## 2021-09-01 NOTE — Transfer of Care (Addendum)
Immediate Anesthesia Transfer of Care Note  Patient: Travis [name].  Procedure(s) Performed: reconstruction of scalp defect with adjacent tissue transfer (Scalp)  Patient Location: PACU  Anesthesia Type:General  Level of Consciousness: drowsy and patient cooperative  Airway & Oxygen Therapy: Patient Spontanous Breathing and Patient connected to face mask oxygen  Post-op Assessment: Report given to RN and Post -op Vital signs reviewed and stable  Post vital signs: Reviewed and stable  Last Vitals:  Vitals Value Taken Time  BP 169/75   Temp    Pulse 81 09/01/21 1437  Resp 17 09/01/21 1437  SpO2 100 % 09/01/21 1437  Vitals shown include unvalidated device data.  Last Pain:  Vitals:   09/01/21 1051  TempSrc: Oral         Complications: No notable events documented.

## 2021-09-02 ENCOUNTER — Encounter (HOSPITAL_COMMUNITY): Payer: Self-pay | Admitting: Plastic Surgery

## 2021-09-03 ENCOUNTER — Telehealth: Payer: Self-pay

## 2021-09-03 NOTE — Telephone Encounter (Signed)
Returned daughters call. Advised for patient to continue sleeping in recliner to reduce swelling. He may remove the hat, but keep bandages on underneath. Follow care until he PO visit on 09/09/2021. Daughter understood and agreed with plan.

## 2021-09-03 NOTE — Telephone Encounter (Signed)
Travis [name] called for patient who is her father.  She said we can call him back or if we want to call her, then that will be fine.  Travis [name] said patient would like to know how long he needs to wear the hat thing on his head, she said she thinks he has a bandage under it.  She said he would also like to know how long he needs to sleep in the recliner.  Please call.

## 2021-09-09 ENCOUNTER — Ambulatory Visit (INDEPENDENT_AMBULATORY_CARE_PROVIDER_SITE_OTHER): Payer: Medicare Other | Admitting: Plastic Surgery

## 2021-09-09 ENCOUNTER — Other Ambulatory Visit: Payer: Self-pay

## 2021-09-09 DIAGNOSIS — C4442 Squamous cell carcinoma of skin of scalp and neck: Secondary | ICD-10-CM

## 2021-09-09 NOTE — Progress Notes (Signed)
Patient is here postop after scalp reconstruction with adjacent tissue transfer.  He overall feels like things are going well.  On exam the skin flaps appear to be viable.  There is some small scabbing along the incision in a couple places but this is fairly limited.  No signs of subcutaneous fluid or infection.  We will plan to leave the stitches and staples in for a bit longer and see him again in 2 weeks.  At that point they should be able to be removed.  He can shower and dressed the scalp according to comfort at this point.  We will plan to see him in 2 weeks.

## 2021-09-24 ENCOUNTER — Other Ambulatory Visit: Payer: Self-pay

## 2021-09-24 ENCOUNTER — Ambulatory Visit (INDEPENDENT_AMBULATORY_CARE_PROVIDER_SITE_OTHER): Payer: Medicare Other | Admitting: Plastic Surgery

## 2021-09-24 DIAGNOSIS — C4442 Squamous cell carcinoma of skin of scalp and neck: Secondary | ICD-10-CM

## 2021-09-24 NOTE — Progress Notes (Signed)
Patient presents postop from closure of large scalp defect with adjacent tissue transfer.  All seems to be going well from his perspective.  On exam the flaps are viable and holding together.  The staples were removed today.  There is a few small areas of scabbing along the incision line.  We will have them continue to clean this with showering and baby shampoo.  He can apply little bit of Vaseline to those open areas.  We will plan to see him back in a few weeks.  All of his questions were answered.

## 2021-10-08 ENCOUNTER — Ambulatory Visit (INDEPENDENT_AMBULATORY_CARE_PROVIDER_SITE_OTHER): Payer: Medicare Other | Admitting: Surgical

## 2021-10-08 ENCOUNTER — Other Ambulatory Visit: Payer: Self-pay

## 2021-10-08 DIAGNOSIS — C4442 Squamous cell carcinoma of skin of scalp and neck: Secondary | ICD-10-CM

## 2021-10-08 NOTE — Progress Notes (Signed)
87 year old male here for follow-up on his scalp reconstruction.  He is overall doing well.  On exam the flaps are viable and incisions are intact.  He does have some areas of scabbing along the incision line, he has been cleaning this area with baby shampoo.  He reports he has not been using the Vaseline.  He is not having any infectious symptoms. I did remove some of the scabbing today along the right lateral incision and the left posterior incision.  No calvarium is exposed at this point.  There is some small open wounds but they are very shallow at this point.  Recommend continuing with Vaseline to the open areas. Recommend continue to wash with baby shampoo. We will plan to see him back in a few weeks.  All of his questions were answered.

## 2021-10-28 ENCOUNTER — Ambulatory Visit (INDEPENDENT_AMBULATORY_CARE_PROVIDER_SITE_OTHER): Payer: Medicare Other | Admitting: Plastic Surgery

## 2021-10-28 ENCOUNTER — Other Ambulatory Visit: Payer: Self-pay

## 2021-10-28 DIAGNOSIS — C4442 Squamous cell carcinoma of skin of scalp and neck: Secondary | ICD-10-CM

## 2021-10-28 NOTE — Progress Notes (Signed)
Patient presents in follow-up after closure of a scalp defect with adjacent tissue transfer.  He feels like he is doing well.  On exam there is a few scabbed areas that are yet to heal over but otherwise this is coming along nicely.  The remainder of his PDS sutures were removed.  We will plan to see him again in 2 months and I suspect he will be totally healed at that point.  All of his questions were answered.

## 2021-10-29 ENCOUNTER — Ambulatory Visit: Payer: Medicare Other | Admitting: Physician Assistant

## 2021-12-09 ENCOUNTER — Other Ambulatory Visit: Payer: Self-pay

## 2021-12-09 ENCOUNTER — Ambulatory Visit (INDEPENDENT_AMBULATORY_CARE_PROVIDER_SITE_OTHER): Payer: Medicare Other | Admitting: Plastic Surgery

## 2021-12-09 DIAGNOSIS — C4442 Squamous cell carcinoma of skin of scalp and neck: Secondary | ICD-10-CM

## 2021-12-09 NOTE — Progress Notes (Signed)
° °  Referring Provider Leonard Downing, MD [address] [place],  [place] [address]   CC:  Chief Complaint  Patient presents with   Follow-up      Travis [name]. is an 88 y.o. male.  HPI: Patient presents in follow-up for closure of a scalp defect with adjacent tissue transfer.  He is overall very happy and thinks things of come along nicely.  Review of Systems General: Denies fevers and chills  Physical Exam Vitals with BMI 09/01/2021 09/01/2021 09/01/2021  Height - - -  Weight - - -  BMI - - -  Systolic 397 673 -  Diastolic 78 88 -  Pulse 88 84 83    General:  No acute distress,  Alert and oriented, Non-Toxic, Normal speech and affect Examination shows that his incisions are totally healed.  He has a great contour.  Most of the hair is grown back from the tissue that was advanced and rotated.  Assessment/Plan Patient is doing well over 3 months out from closure of a scalp defect with adjacent tissue transfer.  He is all healed at this point.  He prefers to follow-up on an as-needed basis.  All of his questions were answered.  Cindra Presume 12/09/2021, 12:31 PM

## 2024-10-18 ENCOUNTER — Emergency Department (HOSPITAL_COMMUNITY)
Admission: EM | Admit: 2024-10-18 | Discharge: 2024-10-18 | Disposition: A | Discharge status: Home / Self Care | Source: Ambulatory Visit | Attending: Emergency Medicine | Admitting: Emergency Medicine

## 2024-10-18 ENCOUNTER — Emergency Department (HOSPITAL_COMMUNITY)

## 2024-10-18 DIAGNOSIS — D649 Anemia, unspecified: Secondary | ICD-10-CM | POA: Diagnosis not present

## 2024-10-18 DIAGNOSIS — S0083XA Contusion of other part of head, initial encounter: Secondary | ICD-10-CM | POA: Diagnosis not present

## 2024-10-18 DIAGNOSIS — S42011A Anterior displaced fracture of sternal end of right clavicle, initial encounter for closed fracture: Secondary | ICD-10-CM | POA: Diagnosis not present

## 2024-10-18 DIAGNOSIS — Z7902 Long term (current) use of antithrombotics/antiplatelets: Secondary | ICD-10-CM | POA: Diagnosis not present

## 2024-10-18 DIAGNOSIS — S4991XA Unspecified injury of right shoulder and upper arm, initial encounter: Secondary | ICD-10-CM | POA: Diagnosis present

## 2024-10-18 DIAGNOSIS — Z87891 Personal history of nicotine dependence: Secondary | ICD-10-CM | POA: Diagnosis not present

## 2024-10-18 DIAGNOSIS — J189 Pneumonia, unspecified organism: Secondary | ICD-10-CM | POA: Diagnosis not present

## 2024-10-18 DIAGNOSIS — Z79899 Other long term (current) drug therapy: Secondary | ICD-10-CM | POA: Diagnosis not present

## 2024-10-18 DIAGNOSIS — R590 Localized enlarged lymph nodes: Secondary | ICD-10-CM | POA: Insufficient documentation

## 2024-10-18 DIAGNOSIS — W01198A Fall on same level from slipping, tripping and stumbling with subsequent striking against other object, initial encounter: Secondary | ICD-10-CM | POA: Diagnosis not present

## 2024-10-18 LAB — CBC WITH DIFFERENTIAL/PLATELET
Abs Immature Granulocytes: 0.05 K/uL (ref 0.00–0.07)
Basophils Absolute: 0 K/uL (ref 0.0–0.1)
Basophils Relative: 0 %
Eosinophils Absolute: 0 K/uL (ref 0.0–0.5)
Eosinophils Relative: 0 %
HCT: 38.8 % — ABNORMAL LOW (ref 39.0–52.0)
Hemoglobin: 12.8 g/dL — ABNORMAL LOW (ref 13.0–17.0)
Immature Granulocytes: 1 %
Lymphocytes Relative: 8 %
Lymphs Abs: 0.5 K/uL — ABNORMAL LOW (ref 0.7–4.0)
MCH: 30 pg (ref 26.0–34.0)
MCHC: 33 g/dL (ref 30.0–36.0)
MCV: 91.1 fL (ref 80.0–100.0)
Monocytes Absolute: 0.6 K/uL (ref 0.1–1.0)
Monocytes Relative: 10 %
Neutro Abs: 5.3 K/uL (ref 1.7–7.7)
Neutrophils Relative %: 81 %
Platelets: 258 K/uL (ref 150–400)
RBC: 4.26 MIL/uL (ref 4.22–5.81)
RDW: 13.1 % (ref 11.5–15.5)
WBC: 6.5 K/uL (ref 4.0–10.5)
nRBC: 0 % (ref 0.0–0.2)

## 2024-10-18 LAB — I-STAT CHEM 8, ED
BUN: 26 mg/dL — ABNORMAL HIGH (ref 8–23)
Calcium, Ion: 1.22 mmol/L (ref 1.15–1.40)
Chloride: 99 mmol/L (ref 98–111)
Creatinine, Ser: 1.3 mg/dL — ABNORMAL HIGH (ref 0.61–1.24)
Glucose, Bld: 125 mg/dL — ABNORMAL HIGH (ref 70–99)
HCT: 40 % (ref 39.0–52.0)
Hemoglobin: 13.6 g/dL (ref 13.0–17.0)
Potassium: 3.6 mmol/L (ref 3.5–5.1)
Sodium: 136 mmol/L (ref 135–145)
TCO2: 22 mmol/L (ref 22–32)

## 2024-10-18 LAB — COMPREHENSIVE METABOLIC PANEL WITH GFR
ALT: 36 U/L (ref 0–44)
AST: 85 U/L — ABNORMAL HIGH (ref 15–41)
Albumin: 4.1 g/dL (ref 3.5–5.0)
Alkaline Phosphatase: 113 U/L (ref 38–126)
Anion gap: 12 (ref 5–15)
BUN: 24 mg/dL — ABNORMAL HIGH (ref 8–23)
CO2: 23 mmol/L (ref 22–32)
Calcium: 9.7 mg/dL (ref 8.9–10.3)
Chloride: 99 mmol/L (ref 98–111)
Creatinine, Ser: 1.27 mg/dL — ABNORMAL HIGH (ref 0.61–1.24)
GFR, Estimated: 53 mL/min — ABNORMAL LOW
Glucose, Bld: 129 mg/dL — ABNORMAL HIGH (ref 70–99)
Potassium: 3.6 mmol/L (ref 3.5–5.1)
Sodium: 135 mmol/L (ref 135–145)
Total Bilirubin: 0.3 mg/dL (ref 0.0–1.2)
Total Protein: 7.6 g/dL (ref 6.5–8.1)

## 2024-10-18 MED ORDER — ALBUTEROL SULFATE HFA 108 (90 BASE) MCG/ACT IN AERS
2.0000 | INHALATION_SPRAY | Freq: Once | RESPIRATORY_TRACT | Status: AC
  Administered 2024-10-18: 2 via RESPIRATORY_TRACT
  Filled 2024-10-18: qty 6.7

## 2024-10-18 MED ORDER — MORPHINE SULFATE (PF) 2 MG/ML IV SOLN
2.0000 mg | Freq: Once | INTRAVENOUS | Status: AC
  Administered 2024-10-18: 2 mg via INTRAVENOUS
  Filled 2024-10-18: qty 1

## 2024-10-18 MED ORDER — IOHEXOL 300 MG/ML  SOLN
75.0000 mL | Freq: Once | INTRAMUSCULAR | Status: AC | PRN
  Administered 2024-10-18: 75 mL via INTRAVENOUS

## 2024-10-18 MED ORDER — DOXYCYCLINE HYCLATE 100 MG PO TABS
100.0000 mg | ORAL_TABLET | Freq: Once | ORAL | Status: AC
  Administered 2024-10-18: 100 mg via ORAL
  Filled 2024-10-18: qty 1

## 2024-10-18 MED ORDER — IPRATROPIUM-ALBUTEROL 0.5-2.5 (3) MG/3ML IN SOLN
3.0000 mL | Freq: Once | RESPIRATORY_TRACT | Status: AC
  Administered 2024-10-18: 3 mL via RESPIRATORY_TRACT
  Filled 2024-10-18: qty 3

## 2024-10-18 MED ORDER — DOXYCYCLINE HYCLATE 100 MG PO CAPS: 100.0000 mg | ORAL_CAPSULE | Freq: Two times a day (BID) | ORAL | 0 refills | Status: AC

## 2024-10-18 MED ORDER — HYDROCODONE-ACETAMINOPHEN 5-325 MG PO TABS: 1.0000 | ORAL_TABLET | Freq: Four times a day (QID) | ORAL | 0 refills | Status: AC | PRN

## 2024-10-18 NOTE — ED Provider Notes (Signed)
 " [place] EMERGENCY DEPARTMENT AT [facility] Provider Note   CSN: [id number] Arrival date & time: 10/18/24  1019     Patient presents with: Travis [name]. is a 89 y.o. male.   HPI 89 year old male presents with a fall. He has chronic balance problems from a prior stroke and gets off balance/falls easily. Fell yesterday and hit his left occipital scalp and also injured his right chest/medial clavicle. He denies LOC. Has a mild headache. He takes plavix . He is also having some mild posterior neck pain. No back pain or abdominal pain. He also is having right shoulder pain and limited ROM.  Family noted some bruising to his chest but it is worse today.  He has also been dealing with a cough with clear sputum for almost 1 week.  He has not had any fevers.  Maybe a little bit of shortness of breath.  He denies any known asthma/COPD history.  Prior to Admission medications  Medication Sig Start Date End Date Taking? Authorizing Provider  doxycycline (VIBRAMYCIN) 100 MG capsule Take 1 capsule (100 mg total) by mouth 2 (two) times daily. 10/18/24  Yes Freddi Hamilton, MD  HYDROcodone -acetaminophen  (NORCO/VICODIN) 5-325 MG tablet Take 1 tablet by mouth every 6 (six) hours as needed for severe pain (pain score 7-10). 10/18/24  Yes Freddi Hamilton, MD  amLODipine  (NORVASC ) 10 MG tablet Take 1 tablet by mouth at bedtime. 12/30/20   [provider]  clopidogrel  (PLAVIX ) 75 MG tablet Take 1 tablet by mouth  daily 03/19/16   Camnitz, Soyla Lunger, MD  finasteride  (PROSCAR ) 5 MG tablet Take 5 mg by mouth daily.    [provider]  fluorouracil (EFUDEX) 5 % cream Apply 1 application topically daily.    [provider]  ibuprofen (ADVIL,MOTRIN) 200 MG tablet Take 200 mg by mouth every 6 (six) hours as needed for headache or moderate pain. Patient not taking: Reported on 12/09/2021    [provider]  ketoconazole (NIZORAL) 2 % cream Apply 1 application  topically 2 (two) times daily.    [provider]  lisinopril  (ZESTRIL ) 40 MG tablet Take 40 mg by mouth daily.    [provider]  metFORMIN  (GLUCOPHAGE ) 500 MG tablet Take 1 tablet (500 mg total) by mouth 2 (two) times daily with a meal. 07/11/15   Jerri Keys, MD  terazosin  (HYTRIN ) 10 MG capsule Take 1 capsule (10 mg total) by mouth at bedtime. 01/30/16   Camnitz, Soyla Lunger, MD    Allergies: Patient has no known allergies.    Review of Systems  Constitutional:  Negative for fever.  Respiratory:  Positive for cough and shortness of breath.   Cardiovascular:  Positive for chest pain.  Gastrointestinal:  Negative for abdominal pain.  Musculoskeletal:  Positive for neck pain. Negative for back pain.  Neurological:  Positive for headaches. Negative for weakness.    Updated Vital Signs BP 116/74 (BP Location: Left Arm)   Pulse 64   Temp 98.1 F (36.7 C) (Oral)   Resp 18   Ht 5' 9 (1.753 m)   Wt 90.7 kg   SpO2 93%   BMI 29.53 kg/m   Physical Exam Vitals and nursing note reviewed.  Constitutional:      Appearance: He is well-developed.  HENT:     Head: Normocephalic.   Cardiovascular:     Rate and Rhythm: Normal rate and regular rhythm.     Pulses:  Radial pulses are 2+ on the right side.     Heart sounds: Normal heart sounds.  Pulmonary:     Effort: Pulmonary effort is normal.     Breath sounds: Normal breath sounds.  Chest:    Abdominal:     Palpations: Abdomen is soft.     Tenderness: There is no abdominal tenderness.  Musculoskeletal:     Right shoulder: No tenderness. Decreased range of motion.     Cervical back: Tenderness present. Muscular tenderness present.     Thoracic back: No tenderness.  Skin:    General: Skin is warm and dry.  Neurological:     Mental Status: He is alert.     Comments: Awake, alert, clear speech. No facial droop. Equal strength in all 4 extremities.     (all labs ordered are listed, but only abnormal  results are displayed) Labs Reviewed  CBC WITH DIFFERENTIAL/PLATELET - Abnormal; Notable for the following components:      Result Value   Hemoglobin 12.8 (*)    HCT 38.8 (*)    Lymphs Abs 0.5 (*)    All other components within normal limits  COMPREHENSIVE METABOLIC PANEL WITH GFR - Abnormal; Notable for the following components:   Glucose, Bld 129 (*)    BUN 24 (*)    Creatinine, Ser 1.27 (*)    AST 85 (*)    GFR, Estimated 53 (*)    All other components within normal limits  I-STAT CHEM 8, ED - Abnormal; Notable for the following components:   BUN 26 (*)    Creatinine, Ser 1.30 (*)    Glucose, Bld 125 (*)    All other components within normal limits    EKG: EKG Interpretation Date/Time:  Thursday October 18 2024 11:30:09 EST Ventricular Rate:  86 PR Interval:  194 QRS Duration:  139 QT Interval:  387 QTC Calculation: 463 R Axis:   -67  Text Interpretation: 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 Multiplanar reformatted images are provided for review. Automated exposure control, iterative reconstruction, and/or weight based adjustment of the mA/kV was utilized to reduce the radiation dose to as low as reasonably achievable. COMPARISON: CT of the chest dated 11/23/2010. CLINICAL HISTORY: Chest trauma, blunt. FINDINGS: MEDIASTINUM: Heart and pericardium are unremarkable. The central airways are clear. The thoracic aorta is normal in caliber and demonstrates mild-to-moderate calcific atheromatous disease. There is mild calcific coronary artery disease. LYMPH NODES: Since the previous study, the patient has developed numerous mildly prominent mediastinal and right hilar lymph nodes.  There is a conglomer right hilar nodal lesion seen on image 74 of series 3, which measures approximately 2.0 x 2.0 x 2.7 cm. There are several mildly prominent precarinal and subcarinal lymph nodes. LUNGS AND PLEURA: There is mild central lobular and paraseptal emphysema. There is bronchiectasis present within the lower lobes bilaterally and within the base of the lingula. There are hazy and reticular opacities also present dependently within the lower lobes bilaterally. No pleural effusion or pneumothorax. SOFT TISSUES/BONES: There is soft tissue stranding of the right anterior chest wall. There is a comminuted fracture of the medial aspect of the right clavicle. UPPER ABDOMEN: Limited images of the upper abdomen demonstrate several stones  within the gallbladder. IMPRESSION: 1. Comminuted fracture of the medial aspect of the right clavicle and soft tissue stranding of the right anterior chest wall, likely related to the reported blunt chest trauma. 2. Interval development of numerous mildly prominent mediastinal and right hilar lymph nodes, including a conglomerate right hilar nodal lesion measuring approximately 2.0 x 2.0 x 2.7 cm. 3. Mild centrilobular and paraseptal emphysema, bronchiectasis within the lower lobes bilaterally and base of the lingula, and hazy/reticular opacities dependently within the lower lobes bilaterally. 4. Mild-to-moderate calcific atheromatous disease of the thoracic aorta and mild calcific coronary artery disease. 5. Cholelithiasis. Electronically signed by: Evalene Coho MD 10/18/2024 01:12 PM EST RP Workstation: [id number]   CT Cervical Spine Wo Contrast Result Date: 10/18/2024 EXAM: CT CERVICAL SPINE WITHOUT CONTRAST 10/18/2024 12:54:55 PM TECHNIQUE: CT of the cervical spine was performed without the administration of intravenous contrast. Multiplanar reformatted images are provided for review. Automated exposure control, iterative reconstruction, and/or weight based adjustment  of the mA/kV was utilized to reduce the radiation dose to as low as reasonably achievable. COMPARISON: CT of the cervical spine dated 02/25/2020. CLINICAL HISTORY: Neck trauma (Age >= 65y). FINDINGS: BONES AND ALIGNMENT: No acute fracture or traumatic malalignment. There is a levoscoliotic curvature of the cervicothoracic spine. There is degenerative retrolisthesis of C3 on C4 and there is degenerative anterolisthesis of C4 on C5. DEGENERATIVE CHANGES: Moderate diffuse bilateral facet arthrosis present, which is more pronounced on the right. SOFT TISSUES: No prevertebral soft tissue swelling. There are calcifications within the carotid bulbs. IMPRESSION: 1. No evidence of fracture or acute traumatic injury. 2. Levoscoliotic curvature of the cervicothoracic spine. 3. Degenerative retrolisthesis of C3 on C4 and degenerative anterolisthesis of C4 on C5. 4. Moderate diffuse bilateral facet arthrosis, more pronounced on the right. 5. Carotid bulb calcifications consistent with atherosclerosis. Electronically signed by: Evalene Coho MD 10/18/2024 01:02 PM EST RP Workstation: [id number]   CT Head Wo Contrast Result Date: 10/18/2024 EXAM: CT HEAD WITHOUT CONTRAST 10/18/2024 12:54:55 PM TECHNIQUE: CT of the head was performed without the administration of intravenous contrast. Automated exposure control, iterative reconstruction, and/or weight based adjustment of the mA/kV was utilized to reduce the radiation dose to as low as reasonably achievable. COMPARISON: CT of the head dated 02/25/2020. CLINICAL HISTORY: Head trauma, minor (Age >= 65y). FINDINGS: BRAIN AND VENTRICLES: No acute hemorrhage. No evidence of acute infarct. Age-related atrophy. Periventricular and deep white matter hypodensity consistent with moderate chronic small vessel ischemia for age. Remote left cerebellar infarct. No hydrocephalus. No extra-axial collection. No mass effect or midline shift. ORBITS: Bilateral cataract resection. SINUSES: Mucosal  thickening throughout ethmoid air cells and maxillary sinuses. SOFT TISSUES AND SKULL: No acute soft tissue abnormality. No skull fracture. Atherosclerosis of skullbase vasculature without hyperdense vessel or abnormal calcification. IMPRESSION: 1. No acute intracranial abnormality. 2. Age-related atrophy, moderate chronic small vessel ischemia, and remote left cerebellar infarct. 3. Paranasal sinus mucosal thickening involving the ethmoid air cells and maxillary sinuses. Electronically signed by: Evalene Coho MD 10/18/2024 01:00 PM EST RP Workstation: [id number]   DG Shoulder Right Result Date: 10/18/2024 EXAM: 1 VIEW(S) XRAY OF THE RIGHT SHOULDER 10/18/2024 11:14:00 AM COMPARISON: None available. CLINICAL HISTORY: Cough, fall Cough, fall FINDINGS: BONES AND JOINTS: Glenohumeral joint is normally aligned. No acute fracture. No malalignment. The [facility] joint is unremarkable. SOFT TISSUES: No abnormal calcifications. Visualized lung is unremarkable. IMPRESSION: 1. No significant abnormality. Electronically signed by: Evalene Coho MD 10/18/2024 12:17 PM EST RP Workstation: [id number]   DG Chest 1  View Result Date: 10/18/2024 EXAM: PA AND LATERAL (2) VIEW(S) XRAY OF THE CHEST 10/18/2024 11:14:00 AM COMPARISON: PA and lateral radiographs of the chest dated 07/05/2015. CLINICAL HISTORY: cough, fall FINDINGS: LINES, TUBES AND DEVICES: Loop recorder in left chest. LUNGS AND PLEURA: No focal pulmonary opacity. No pleural effusion. No pneumothorax. HEART AND MEDIASTINUM: Atherosclerotic calcifications of aortic arch. No acute abnormality of the cardiac and mediastinal silhouettes. BONES AND SOFT TISSUES: No acute osseous abnormality. IMPRESSION: 1. No acute cardiopulmonary abnormality. 2. Loop recorder overlying the left chest. Aortic atherosclerosis. Electronically signed by: Evalene Coho MD 10/18/2024 12:17 PM EST RP Workstation: [id number]     Procedures   Medications Ordered in the ED  doxycycline  (VIBRA-TABS) tablet 100 mg (has no administration in time range)  albuterol (VENTOLIN HFA) 108 (90 Base) MCG/ACT inhaler 2 puff (has no administration in time range)  ipratropium-albuterol (DUONEB) 0.5-2.5 (3) MG/3ML nebulizer solution 3 mL (3 mLs Nebulization Given 10/18/24 1144)  morphine  (PF) 2 MG/ML injection 2 mg (2 mg Intravenous Given 10/18/24 1300)  iohexol  (OMNIPAQUE ) 300 MG/ML solution 75 mL (75 mLs Intravenous Contrast Given 10/18/24 1244)                                    Medical Decision Making Amount and/or Complexity of Data Reviewed Labs: ordered.    Details: Chronic and stable anemia Radiology: ordered and independent interpretation performed.    Details: Right medial clavicle fracture. ECG/medicine tests: ordered and independent interpretation performed.    Details: Chronic and unchanged findings  Risk Prescription drug management.   Patient presents with fall and chest/head injury.  From a head/C-spine perspective he has no significant findings.  He does have bruising to his chest so a CT was obtained.  He has a clavicle fracture without obvious hematoma or more significant injury.  Did discuss briefly with Ortho, Dr. Genelle, and  sling and outpatient follow-up in 1-2 weeks is recommended.  Patient's pain seems to be controlled.  From a almanac perspective, he likely has undiagnosed emphysema.  He has a prior history of smoking.  He had transient hypoxia requiring supplemental oxygen, though it is unclear if this was true hypoxia or not.  Frequently when I have been in the room he is frequently moving his finger giving a poor waveform.  When he holds his hand still he has a good pleth and sats have been 93+ percent.  I did offer admission given the borderline sats and cough with what might be some mild pneumonia/pneumonitis.  He declines and wants to go home.  He is feeling well.  I think is reasonable to cover with some antibiotics but I think outpatient follow-up is also  reasonable per his request.  Will give some pain control.  He has declined anything else for pain now.    No signs of significant trauma besides the clavicle fracture.  Will discharge home with return precautions.     Final diagnoses:  Closed anterior displaced fracture of sternal end of right clavicle, initial encounter  Atypical pneumonia  Mediastinal lymphadenopathy    ED Discharge Orders          Ordered    HYDROcodone -acetaminophen  (NORCO/VICODIN) 5-325 MG tablet  Every 6 hours PRN        10/18/24 1407    doxycycline (VIBRAMYCIN) 100 MG capsule  2 times daily        10/18/24 1407  Freddi Hamilton, MD 10/18/24 1424  "

## 2024-10-18 NOTE — Discharge Instructions (Addendum)
 Your scan shows that you have a broken collarbone (clavicle).  We are treating you with a sling and pain control.  Follow-up with the orthopedist in 1-2 weeks.  Your x-ray also shows a possible pneumonia/lung infection.  We are treating you with antibiotics for this.  Your lymph nodes are little bit inflamed/enlarged, probably from an infection but you will need to follow-up with your primary care doctor.  You may use the albuterol inhaler 1-2 puffs every 4-6 hours as needed for cough or shortness of breath.  If you develop new or worsening pain, trouble breathing, or any other new/concerning symptoms then return to the ER.

## 2024-10-18 NOTE — Progress Notes (Signed)
 Orthopedic Tech Progress Note Patient Details:  Travis [name]. 1930/05/16 [id number]  Ortho Devices Type of Ortho Device: Shoulder immobilizer Ortho Device/Splint Location: right Ortho Device/Splint Interventions: Ordered, Application, Adjustment   Post Interventions Patient Tolerated: Well Instructions Provided: Adjustment of device, Care of device  [name] 10/18/2024, 2:16 PM

## 2024-10-18 NOTE — ED Notes (Signed)
 Pt placed on 2L o2 via [place] d/t low o2 sats. MD made aware.

## 2024-10-18 NOTE — ED Triage Notes (Signed)
 Patient reports fall yesterday, hit head, left back area, pain there and in right shoulder  Rated 9/10 Patient on plavix 

## 2024-11-04 ENCOUNTER — Emergency Department (HOSPITAL_COMMUNITY)

## 2024-11-04 ENCOUNTER — Encounter (HOSPITAL_COMMUNITY): Payer: Self-pay

## 2024-11-04 ENCOUNTER — Other Ambulatory Visit: Payer: Self-pay

## 2024-11-04 ENCOUNTER — Emergency Department (HOSPITAL_COMMUNITY)
Admission: EM | Admit: 2024-11-04 | Discharge: 2024-11-04 | Disposition: A | Discharge status: Home / Self Care | Attending: Emergency Medicine | Admitting: Emergency Medicine

## 2024-11-04 DIAGNOSIS — I129 Hypertensive chronic kidney disease with stage 1 through stage 4 chronic kidney disease, or unspecified chronic kidney disease: Secondary | ICD-10-CM | POA: Diagnosis not present

## 2024-11-04 DIAGNOSIS — E119 Type 2 diabetes mellitus without complications: Secondary | ICD-10-CM | POA: Diagnosis not present

## 2024-11-04 DIAGNOSIS — S0101XA Laceration without foreign body of scalp, initial encounter: Secondary | ICD-10-CM | POA: Diagnosis not present

## 2024-11-04 DIAGNOSIS — Z7984 Long term (current) use of oral hypoglycemic drugs: Secondary | ICD-10-CM | POA: Insufficient documentation

## 2024-11-04 DIAGNOSIS — W19XXXA Unspecified fall, initial encounter: Secondary | ICD-10-CM

## 2024-11-04 DIAGNOSIS — R590 Localized enlarged lymph nodes: Secondary | ICD-10-CM | POA: Diagnosis not present

## 2024-11-04 DIAGNOSIS — Y92009 Unspecified place in unspecified non-institutional (private) residence as the place of occurrence of the external cause: Secondary | ICD-10-CM | POA: Diagnosis not present

## 2024-11-04 DIAGNOSIS — Z79899 Other long term (current) drug therapy: Secondary | ICD-10-CM | POA: Insufficient documentation

## 2024-11-04 DIAGNOSIS — S51011A Laceration without foreign body of right elbow, initial encounter: Secondary | ICD-10-CM | POA: Diagnosis not present

## 2024-11-04 DIAGNOSIS — S79911A Unspecified injury of right hip, initial encounter: Secondary | ICD-10-CM | POA: Diagnosis not present

## 2024-11-04 DIAGNOSIS — N189 Chronic kidney disease, unspecified: Secondary | ICD-10-CM | POA: Diagnosis not present

## 2024-11-04 DIAGNOSIS — R599 Enlarged lymph nodes, unspecified: Secondary | ICD-10-CM

## 2024-11-04 DIAGNOSIS — W01198A Fall on same level from slipping, tripping and stumbling with subsequent striking against other object, initial encounter: Secondary | ICD-10-CM | POA: Insufficient documentation

## 2024-11-04 DIAGNOSIS — S0990XA Unspecified injury of head, initial encounter: Secondary | ICD-10-CM | POA: Diagnosis present

## 2024-11-04 NOTE — ED Provider Notes (Signed)
 " [facility] AT [facility] Provider Note   CSN: [id number] Arrival date & time: 11/04/24  0348     Patient presents with: Travis [name]. is a 89 y.o. male.   The history is provided by the patient.  Fall   He has history of hypertension, diabetes, stroke, chronic kidney disease and comes in following a fall at home.  He suffered a scalp laceration and is also complaining of pain in his right elbow.  He has been having right shoulder pain since another fall where he suffered a right clavicle fracture.  He also injured his right hip in the fall.  He has been ambulatory.  He is up-to-date on tetanus immunizations.    Prior to Admission medications  Medication Sig Start Date End Date Taking? Authorizing Provider  amLODipine  (NORVASC ) 10 MG tablet Take 1 tablet by mouth at bedtime. 12/30/20   [provider]  clopidogrel  (PLAVIX ) 75 MG tablet Take 1 tablet by mouth  daily 03/19/16   Camnitz, Soyla Lunger, MD  doxycycline  (VIBRAMYCIN ) 100 MG capsule Take 1 capsule (100 mg total) by mouth 2 (two) times daily. 10/18/24   Freddi Hamilton, MD  finasteride  (PROSCAR ) 5 MG tablet Take 5 mg by mouth daily.    [provider]  fluorouracil (EFUDEX) 5 % cream Apply 1 application topically daily.    [provider]  HYDROcodone -acetaminophen  (NORCO/VICODIN) 5-325 MG tablet Take 1 tablet by mouth every 6 (six) hours as needed for severe pain (pain score 7-10). 10/18/24   Freddi Hamilton, MD  ibuprofen (ADVIL,MOTRIN) 200 MG tablet Take 200 mg by mouth every 6 (six) hours as needed for headache or moderate pain. Patient not taking: Reported on 12/09/2021    [provider]  ketoconazole (NIZORAL) 2 % cream Apply 1 application topically 2 (two) times daily.    [provider]  lisinopril  (ZESTRIL ) 40 MG tablet Take 40 mg by mouth daily.    [provider]  metFORMIN  (GLUCOPHAGE ) 500 MG tablet Take 1 tablet (500 mg total)  by mouth 2 (two) times daily with a meal. 07/11/15   Jerri Keys, MD  terazosin  (HYTRIN ) 10 MG capsule Take 1 capsule (10 mg total) by mouth at bedtime. 01/30/16   Camnitz, Soyla Lunger, MD    Allergies: Patient has no known allergies.    Review of Systems  All other systems reviewed and are negative.   Updated Vital Signs BP 109/77   Pulse 94   Temp 98.2 F (36.8 C)   Resp 15   Ht 5' 9 (1.753 m)   Wt 90.7 kg   SpO2 96%   BMI 29.53 kg/m   Physical Exam Vitals and nursing note reviewed.   89 year old male, resting comfortably and in no acute distress. Vital signs are normal. Oxygen saturation is 96%, which is normal. Head is normocephalic.  Laceration is present in the right frontal region. PERRLA, EOMI. Oropharynx is clear. Neck is nontender. Back is nontender. Lungs are clear without rales, wheezes, or rhonchi. Heart has regular rate and rhythm without murmur. Abdomen is soft, flat, nontender. Extremities: Small skin tear present over the right olecranon.  Right shoulder tender to palpation and pain with any range of motion.  Full passive range of motion of all other joints without pain. Skin is warm and dry without rash. Neurologic: Awake and alert, cranial nerves are intact, moves all extremities equally.      Radiology: CT Cervical Spine  Wo Contrast Result Date: 11/04/2024 EXAM: CT CERVICAL SPINE WITHOUT CONTRAST 11/04/2024 04:18:16 AM TECHNIQUE: CT of the cervical spine was performed without the administration of intravenous contrast. Multiplanar reformatted images are provided for review. Automated exposure control, iterative reconstruction, and/or weight based adjustment of the mA/kV was utilized to reduce the radiation dose to as low as reasonably achievable. COMPARISON: CT of the cervical spine dated 10/18/2024. CLINICAL HISTORY: Neck trauma (Age >= 65y). FINDINGS: BONES AND ALIGNMENT: Mild-to-moderate levocurvature of the cervical spine again demonstrated. Slight  degenerative anterolisthesis at C2-C3 and C4-C5. Slight degenerative retrolisthesis at C3-C4. The C5-C6 disc space is partially fused. Since the previous study, the patient has developed osteolysis of the medial aspect of the right clavicle. DEGENERATIVE CHANGES: Moderate disc space narrowing and moderate bilateral facet arthrosis at C3-C4, C4-C5, C5-C6, and C6-C7. SOFT TISSUES: No prevertebral soft tissue swelling. Associated soft tissue fullness with the right clavicle osteolysis, likely from inflammation or infection. Differential diagnoses for the clavicle osteolysis and associated soft tissue changes include infection (osteomyelitis), malignancy (primary or metastatic), or inflammatory conditions. Follow-up imaging, such as dedicated MRI of the right clavicle, is recommended for further characterization. Several enlarged left supraclavicular lymph nodes measuring approximately 15 to 19 mm in long axis. Several shotty cervical lymph nodes present bilaterally. IMPRESSION: 1. Osteolysis of the medial right clavicle with associated soft tissue fullness, most concerning for osteomyelitis and/or septic arthritis of the sternoclavicular joint; differential also includes metastatic disease, multiple myeloma, and inflammatory/crystal arthropathy. Recommend clinical correlation (fever, WBC/ESR/CRP) and further evaluation with MRI with IV contrast of the sternoclavicular region; consider image-guided aspiration/biopsy if infection or neoplasm remains a concern. 2. Enlarged left supraclavicular lymph nodes measuring approximately 15 to 19 mm in long axis, with additional shotty cervical lymph nodes bilaterally; given location, consider malignant adenopathy (metastatic disease/lymphoma) though reactive/infectious adenopathy remains possible. 3. Slight degenerative anterolisthesis at C2-3 and C4-5, and slight degenerative retrolisthesis at C3-4. 4. Moderate disc space narrowing and moderate bilateral facet arthrosis at C3-4,  C4-5, C5-6, and C6-7. The C5-6 disc space is partially fused. Electronically signed by: Evalene Coho MD 11/04/2024 04:55 AM EST RP Workstation: [id number]   DG Hip Unilat W or Wo Pelvis 2-3 Views Left Result Date: 11/04/2024 EXAM: 2 or 3 view(s) Xray of the left hip 11/04/2024 04:33:43 AM COMPARISON: None available. CLINICAL HISTORY: Fall FINDINGS: BONES AND JOINTS: No acute fracture. No malalignment. SOFT TISSUES: Unremarkable. IMPRESSION: 1. No significant abnormality in the left hip or visualized pelvis. Electronically signed by: Evalene Coho MD 11/04/2024 04:45 AM EST RP Workstation: [id number]   DG Hip Unilat W or Wo Pelvis 2-3 Views Right Result Date: 11/04/2024 EXAM: 2 OR 3 VIEW(S) XRAY OF THE RIGHT HIP 11/04/2024 04:33:43 AM COMPARISON: None available. CLINICAL HISTORY: fall fall fall FINDINGS: BONES AND JOINTS: Mild osteoarthritis of the right hip. No evidence of acute traumatic injury. SOFT TISSUES: Vascular calcifications. IMPRESSION: 1. No evidence of acute traumatic injury. 2. Mild osteoarthritis of the right hip. 3. Vascular calcifications. Electronically signed by: Evalene Coho MD 11/04/2024 04:45 AM EST RP Workstation: [id number]   CT Head Wo Contrast Result Date: 11/04/2024 EXAM: CT HEAD WITHOUT CONTRAST 11/04/2024 04:18:16 AM TECHNIQUE: CT of the head was performed without the administration of intravenous contrast. Automated exposure control, iterative reconstruction, and/or weight based adjustment of the mA/kV was utilized to reduce the radiation dose to as low as reasonably achievable. COMPARISON: 10/18/2024 CLINICAL HISTORY: Head trauma, moderate-severe. FINDINGS: BRAIN AND VENTRICLES: No acute hemorrhage. No evidence of acute infarct. Stable atrophy  and chronic small vessel ischemia. Remote left cerebellar infarct. No hydrocephalus. No extra-axial collection. No mass effect or midline shift. ORBITS: Bilateral cataract resection. SINUSES: No acute abnormality. SOFT TISSUES  AND SKULL: Left parietal scalp hematoma. No skull fracture. VASCULATURE: Atherosclerosis of skullbase vasculature without hyperdense vessel or abnormal calcification. IMPRESSION: 1. No acute intracranial abnormality. 2. Left parietal scalp hematoma. 3. Stable atrophy and chronic small vessel ischemic change with remote left cerebellar infarct. Electronically signed by: Evalene Coho MD 11/04/2024 04:44 AM EST RP Workstation: [id number]     .Laceration Repair  Date/Time: 11/04/2024 5:38 AM  Performed by: Raford Lenis, MD Authorized by: Raford Lenis, MD   Consent:    Consent obtained:  Verbal   Consent given by:  Patient   Risks discussed:  Infection, pain, retained foreign body and poor cosmetic result   Alternatives discussed:  No treatment Universal protocol:    Procedure explained and questions answered to patient or proxy's satisfaction: yes     Relevant documents present and verified: yes     Test results available: yes     Imaging studies available: yes     Required blood products, implants, devices, and special equipment available: yes     Site/side marked: yes     Immediately prior to procedure, a time out was called: yes     Patient identity confirmed:  Verbally with patient and arm band Anesthesia:    Anesthesia method:  None Laceration details:    Location:  Scalp   Length (cm):  2   Depth (mm):  3 Pre-procedure details:    Preparation:  Patient was prepped and draped in usual sterile fashion and imaging obtained to evaluate for foreign bodies Exploration:    Limited defect created (wound extended): no     Hemostasis achieved with:  Direct pressure   Imaging obtained: x-ray     Imaging outcome: foreign body not noted     Wound exploration: entire depth of wound visualized     Wound extent: no foreign body     Contaminated: no   Treatment:    Area cleansed with:  Saline   Amount of cleaning:  Standard   Debridement:  None   Undermining:  None   Scar revision:  no   Skin repair:    Repair method:  Staples   Number of staples:  3 Approximation:    Approximation:  Close Repair type:    Repair type:  Simple Post-procedure details:    Dressing:  Open (no dressing)   Procedure completion:  Tolerated well, no immediate complications    Medications Ordered in the ED - No data to display                                  Medical Decision Making Amount and/or Complexity of Data Reviewed Radiology: ordered.   Fall with scalp laceration, injury to right hip, aggravation of prior shoulder injury.  X-rays of the both hips show no evidence of fracture.  CT head shows no fracture and no intracranial injury.  CT of cervical spine shows no acute traumatic injury but incidental finding of osteolysis of the medial right clavicle and also left supraclavicular adenopathy.  I have independently viewed all of the images, and agree with the radiologist's interpretation.  He refused x-rays of his right shoulder since he knew it was fractured recently.  Recent clavicle fracture probably accounts for the osteolysis seen although  this will need to be followed up.  I closed the scalp laceration with staples, I am discharging him with instructions to apply ice, take over-the-counter NSAIDs and acetaminophen  as needed for pain.  Staples are to be removed in 7-10 days.  He will need to discuss with his primary care provider how aggressively to pursue his adenopathy.     Final diagnoses:  Fall at home, initial encounter  Scalp laceration, initial encounter  Adenopathy  Skin tear of right elbow without complication, initial encounter    ED Discharge Orders     None          Raford Lenis, MD 11/04/24 0540  "

## 2024-11-04 NOTE — Discharge Instructions (Addendum)
 You may apply ice to any area which is painful or swollen.  Ice can be applied for 30 minutes at a time, 4 times a day.  Staples should be removed in 7-10 days.  You may take acetaminophen  and/or ibuprofen as needed for pain.  Please be aware that when you combine acetaminophen  and ibuprofen, you will get better pain relief and you get from taking either medication by itself.  Your CT scan shows some enlarged lymph nodes above the left clavicle.  Please discuss with your primary care provider how to further evaluate this.

## 2024-11-04 NOTE — ED Triage Notes (Signed)
 Fall while going to restroom striking head on ground. Also concern for injuring right hip and shoulder.   Large cut to back of head.   Compliant on Plavix .
